# Patient Record
Sex: Female | Born: 1950 | Race: White | Hispanic: No | Smoking: Current every day smoker
Health system: Southern US, Community
[De-identification: ages and names within clinical notes are randomized; demographics above are authoritative.]

## PROBLEM LIST (undated history)

## (undated) DIAGNOSIS — K449 Diaphragmatic hernia without obstruction or gangrene: Secondary | ICD-10-CM

## (undated) DIAGNOSIS — I10 Essential (primary) hypertension: Secondary | ICD-10-CM

## (undated) HISTORY — PX: FRACTURE SURGERY: SHX138

## (undated) HISTORY — DX: Essential (primary) hypertension: I10

## (undated) HISTORY — PX: TUBAL LIGATION: SHX77

## (undated) HISTORY — DX: Diaphragmatic hernia without obstruction or gangrene: K44.9

---

## 2011-12-16 ENCOUNTER — Emergency Department: Payer: Self-pay | Admitting: Emergency Medicine

## 2011-12-16 LAB — CBC
HCT: 41.5 % (ref 35.0–47.0)
HGB: 14.1 g/dL (ref 12.0–16.0)
MCH: 29.6 pg (ref 26.0–34.0)
MCHC: 34 g/dL (ref 32.0–36.0)
MCV: 87 fL (ref 80–100)
Platelet: 142 10*3/uL — ABNORMAL LOW (ref 150–440)
RBC: 4.76 10*6/uL (ref 3.80–5.20)
WBC: 8.4 10*3/uL (ref 3.6–11.0)

## 2011-12-16 LAB — COMPREHENSIVE METABOLIC PANEL
Alkaline Phosphatase: 115 U/L (ref 50–136)
Anion Gap: 6 — ABNORMAL LOW (ref 7–16)
Bilirubin,Total: 0.4 mg/dL (ref 0.2–1.0)
Chloride: 109 mmol/L — ABNORMAL HIGH (ref 98–107)
Creatinine: 0.83 mg/dL (ref 0.60–1.30)
EGFR (African American): >60
Osmolality: 285 (ref 275–301)
Potassium: 4 mmol/L (ref 3.5–5.1)
SGPT (ALT): 15 U/L (ref 12–78)
Total Protein: 6.9 g/dL (ref 6.4–8.2)

## 2011-12-16 IMAGING — CT CT HEAD WITHOUT CONTRAST
1 series · 16 of 30 positions shown, 20 images · non-contrast
Comparison: none

REASON FOR EXAM: right side facial droop, onset 3 days ago
COMMENTS:

PROCEDURE:     CT  - CT HEAD WITHOUT CONTRAST  - [DATE] [DATE]
RESULT:     Head CT dated [DATE].
TECHNIQUE: Helical noncontrasted 5 mm sections were obtained in the skull
base to the vertex.

[Series 2: soft tissue · axial · 0.43mm/px · z∈[-116,+18]mm · 16 of 31 slices shown, 20 images]
[im 2/31  brain]
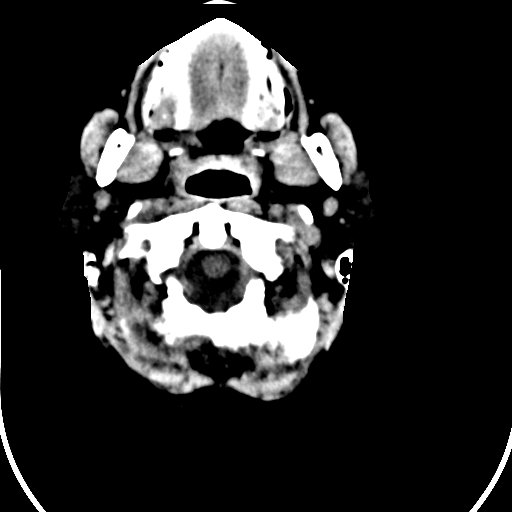
[im 2/31  bone]
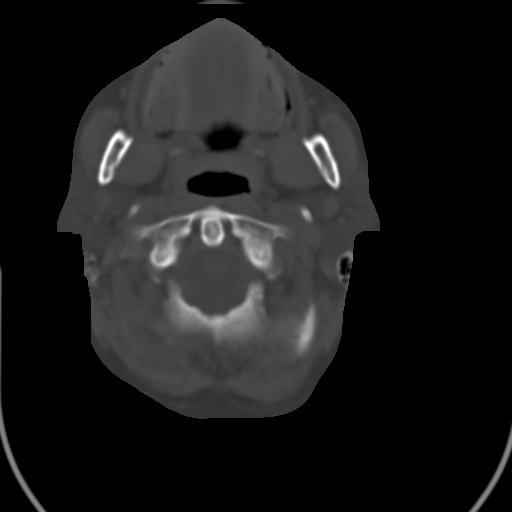
[im 4/31  brain]
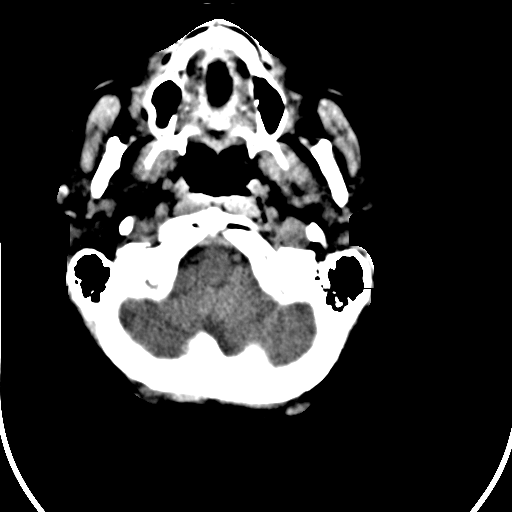
[im 6/31  brain]
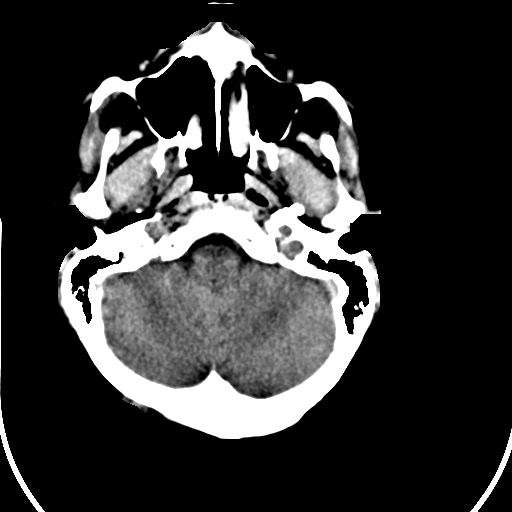
[im 8/31  brain]
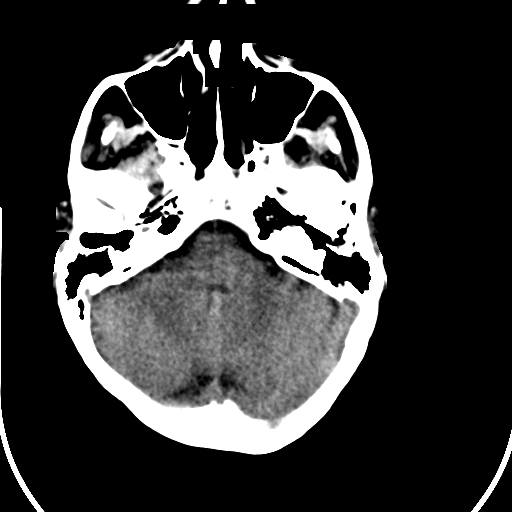
[im 9/31  brain]
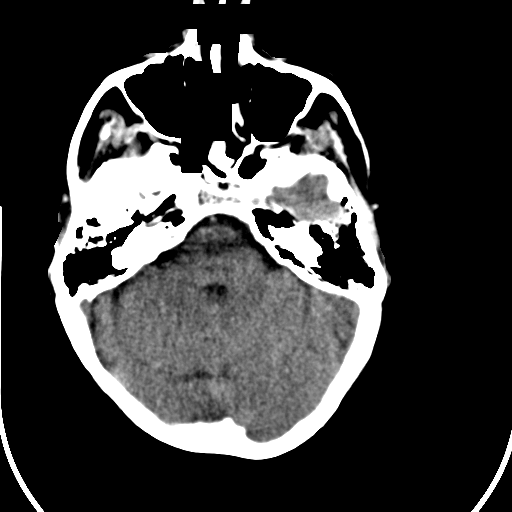
[im 9/31  bone]
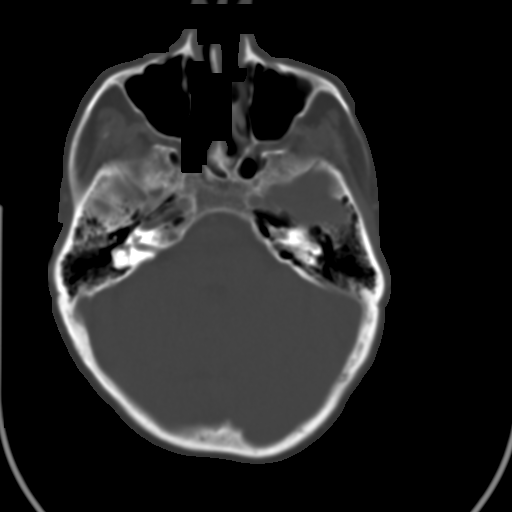
[im 11/31  brain]
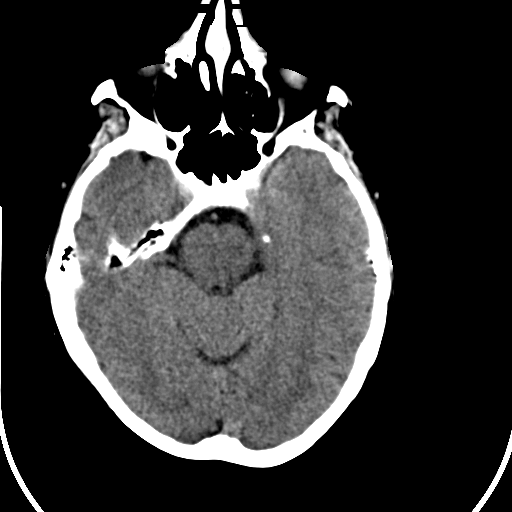
[im 13/31  brain]
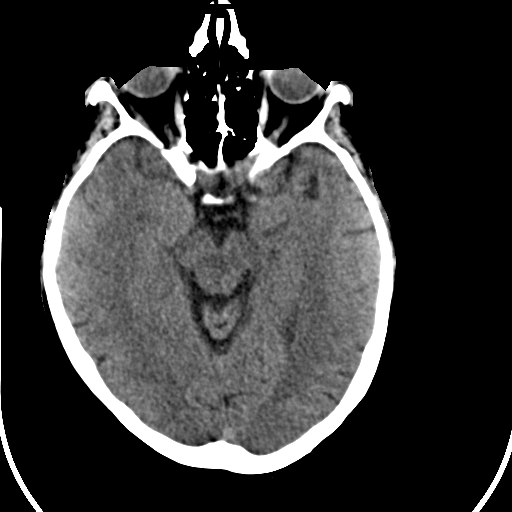
[im 15/31  brain]
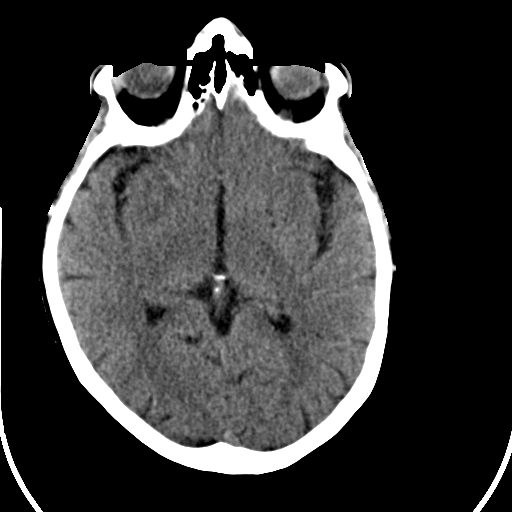
[im 16/31  brain]
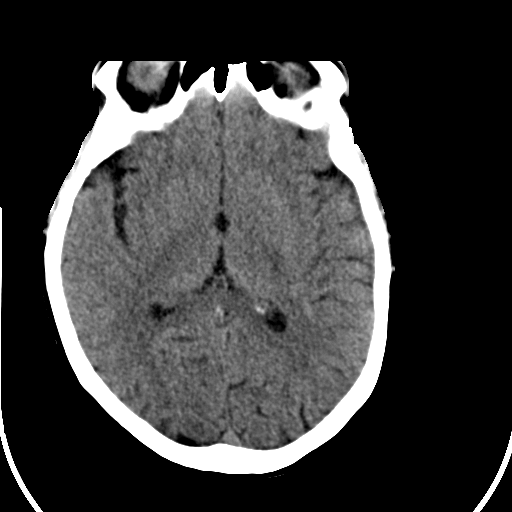
[im 16/31  bone]
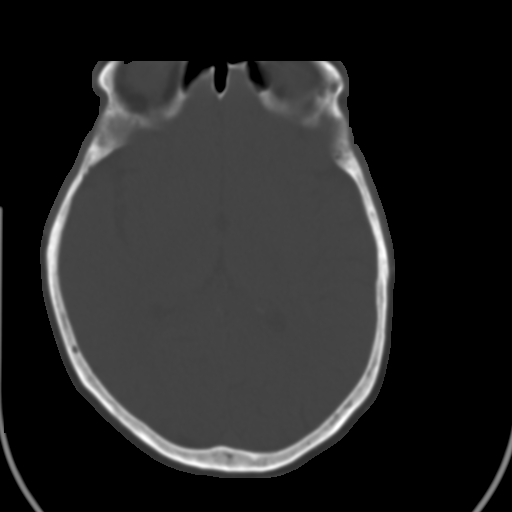
[im 18/31  brain]
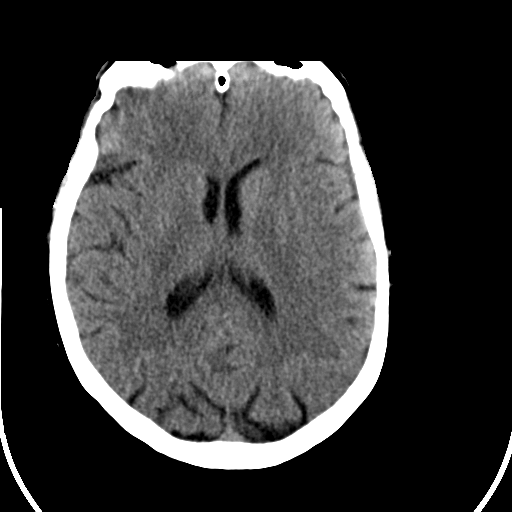
[im 20/31  brain]
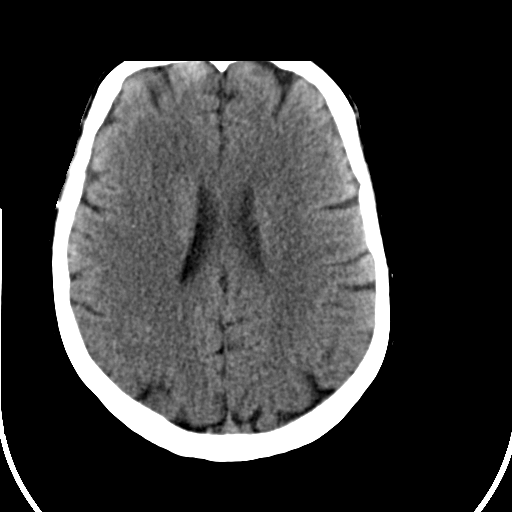
[im 22/31  brain]
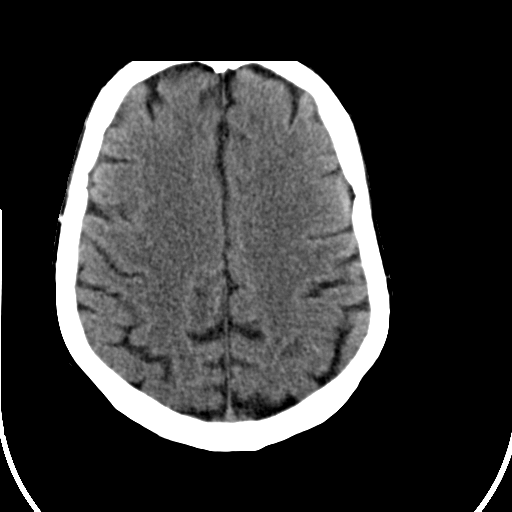
[im 23/31  brain]
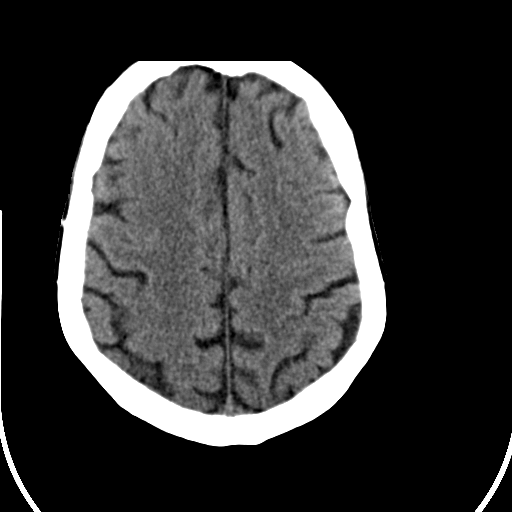
[im 23/31  bone]
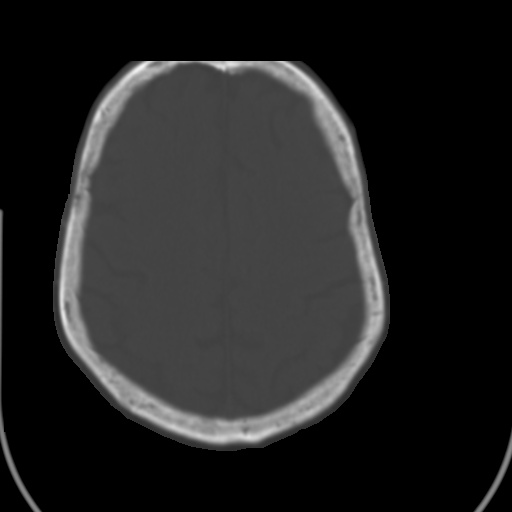
[im 25/31  brain]
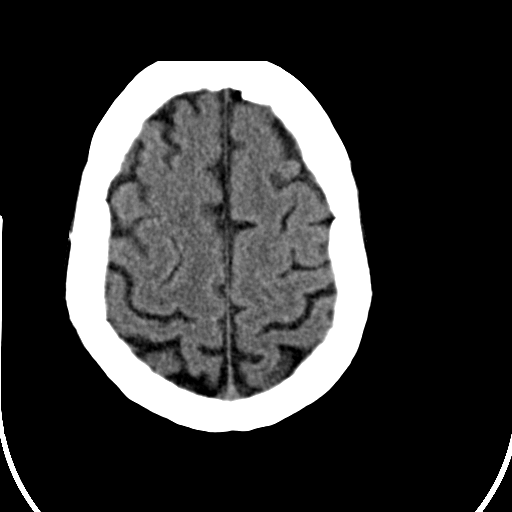
[im 27/31  brain]
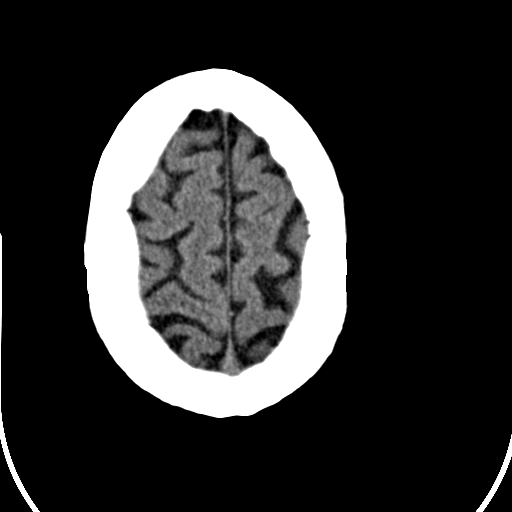
[im 29/31  brain]
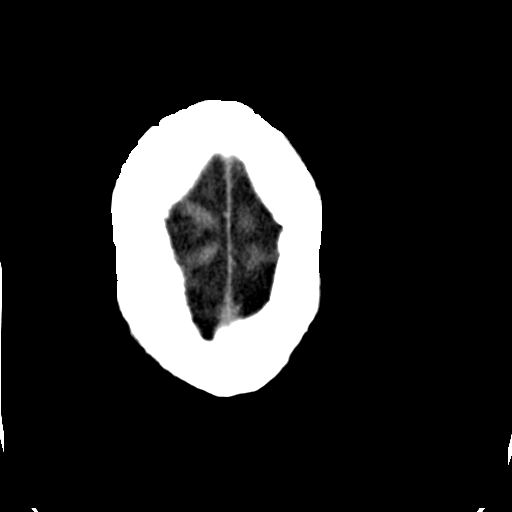

[16 of 30 positions shown; findings below may reference images not displayed]

FINDINGS: There is no evidence of intra-axial nor extra-axial fluid
collections nor evidence of acute hemorrhage. There is no evidence of mass
effect nor a depressed skull fracture. Mild diffuse areas of low-attenuation
project within the subcortical, deep, and periventricular white matter
regions. There is no evidence of a depressed skull fracture. Visualized
paranasal sinuses and mastoid air cells are patent.
IMPRESSION: Small vessel ischemic changes without evidence of focal or
acute abnormalities.

## 2013-09-02 ENCOUNTER — Emergency Department: Payer: Self-pay | Admitting: Emergency Medicine

## 2017-01-02 ENCOUNTER — Emergency Department: Payer: BLUE CROSS/BLUE SHIELD

## 2017-01-02 ENCOUNTER — Encounter: Payer: Self-pay | Admitting: Emergency Medicine

## 2017-01-02 ENCOUNTER — Emergency Department
Admission: EM | Admit: 2017-01-02 | Discharge: 2017-01-02 | Disposition: A | Discharge status: Home / Self Care | Payer: BLUE CROSS/BLUE SHIELD | Attending: Emergency Medicine | Admitting: Emergency Medicine

## 2017-01-02 DIAGNOSIS — F17210 Nicotine dependence, cigarettes, uncomplicated: Secondary | ICD-10-CM | POA: Insufficient documentation

## 2017-01-02 DIAGNOSIS — I10 Essential (primary) hypertension: Secondary | ICD-10-CM | POA: Insufficient documentation

## 2017-01-02 DIAGNOSIS — R0789 Other chest pain: Secondary | ICD-10-CM | POA: Insufficient documentation

## 2017-01-02 LAB — CBC
HCT: 40.9 % (ref 35.0–47.0)
HEMOGLOBIN: 14 g/dL (ref 12.0–16.0)
MCH: 29 pg (ref 26.0–34.0)
MCHC: 34.1 g/dL (ref 32.0–36.0)
MCV: 84.9 fL (ref 80.0–100.0)
PLATELETS: 142 10*3/uL — AB (ref 150–440)
RBC: 4.82 MIL/uL (ref 3.80–5.20)
RDW: 14.5 % (ref 11.5–14.5)
WBC: 9.1 10*3/uL (ref 3.6–11.0)

## 2017-01-02 LAB — BASIC METABOLIC PANEL
ANION GAP: 8 (ref 5–15)
BUN: 16 mg/dL (ref 6–20)
CALCIUM: 9.1 mg/dL (ref 8.9–10.3)
CO2: 28 mmol/L (ref 22–32)
CREATININE: 0.76 mg/dL (ref 0.44–1.00)
Chloride: 103 mmol/L (ref 101–111)
Glucose, Bld: 86 mg/dL (ref 65–99)
Potassium: 3.6 mmol/L (ref 3.5–5.1)
SODIUM: 139 mmol/L (ref 135–145)

## 2017-01-02 LAB — TROPONIN I

## 2017-01-02 IMAGING — CR DG CHEST 2V
2 series · 2 of 2 positions shown · non-contrast
Comparison: None.

CLINICAL DATA: Acute chest pain today.

EXAM:
CHEST  2 VIEW

[chest pa]
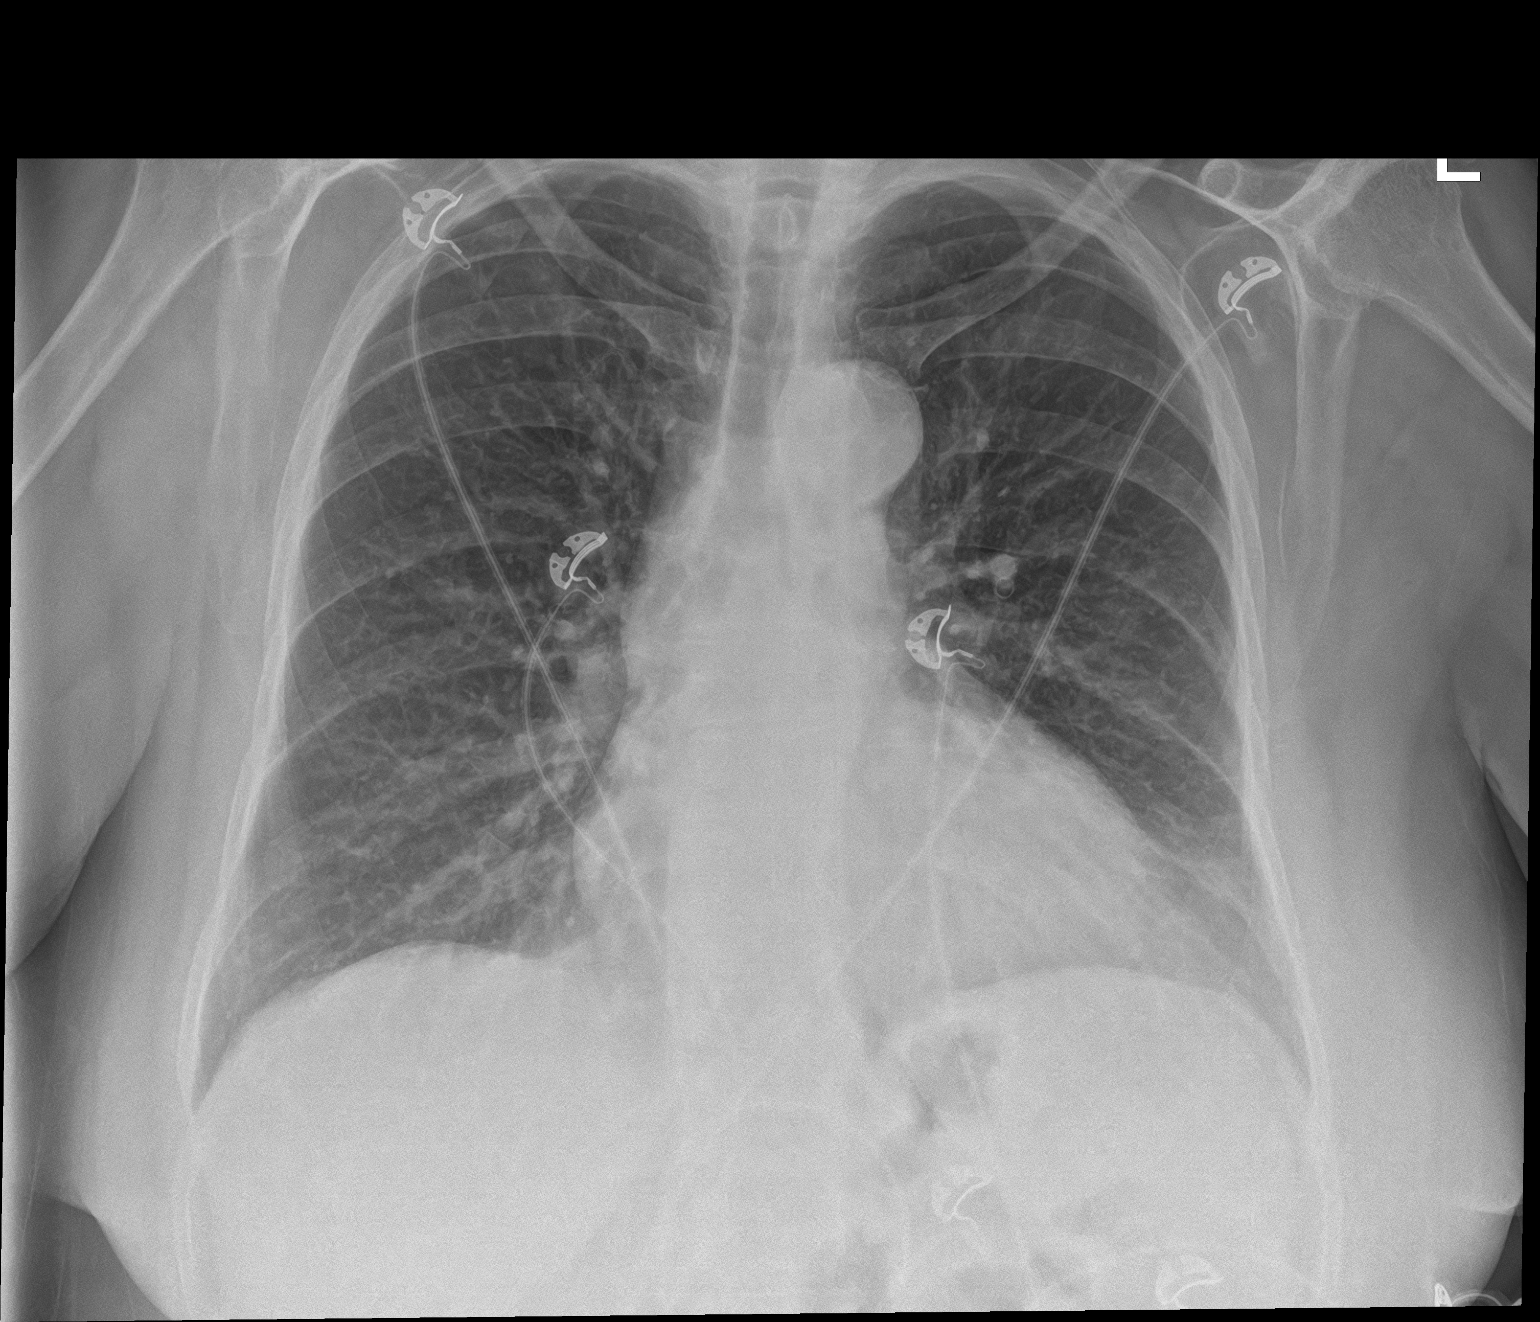

[chest lat]
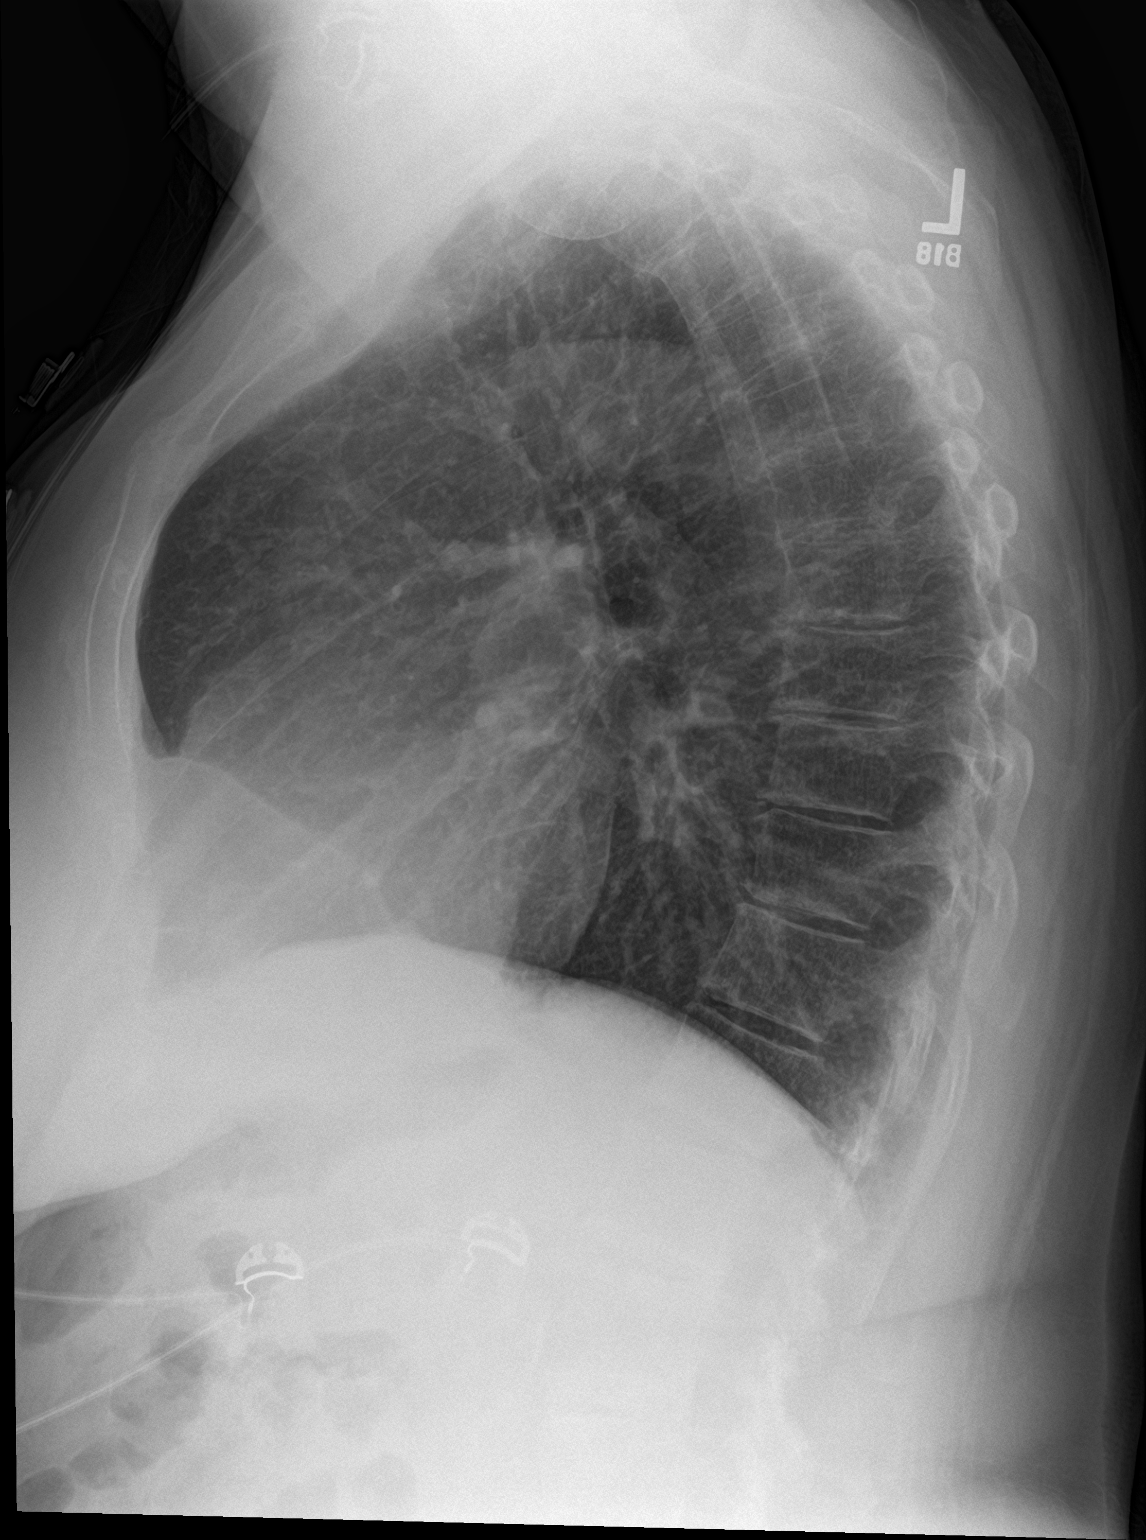

[2 of 2 positions shown; findings below may reference images not displayed]

FINDINGS: Upper limits normal heart size noted.

Mild peribronchial thickening noted.

There is no evidence of focal airspace disease, pulmonary edema,
suspicious pulmonary nodule/mass, pleural effusion, or pneumothorax.
No acute bony abnormalities are identified.
IMPRESSION: Mild peribronchial thickening of uncertain chronicity. No evidence
of focal pneumonia.

Upper limits normal heart size

## 2017-01-02 MED ORDER — AMLODIPINE BESYLATE 5 MG PO TABS: 5.0000 mg | ORAL_TABLET | Freq: Every day | ORAL | 0 refills | Status: DC

## 2017-01-02 NOTE — ED Triage Notes (Signed)
Pt arrives via [facility] with complaints of chest pain while at work that started around 3pm today. Pt reports feeling a heaviness on her chest. Pt reports having pain on and off x 1 year but never following up about it. Pt takes caffeine tablets. Last one was taken this morning. Pt received 4 baby aspirin en route with EMS and has 1" nitro paste to left chest. Pt denies shortness of breath, nausea and vomiting.

## 2017-01-02 NOTE — ED Provider Notes (Signed)
[facility] Emergency Department Provider Note ____________________________________________   First MD Initiated Contact with Patient 01/02/17 1642     (approximate)  I have reviewed the triage vital signs and the nursing notes.   HISTORY  Chief Complaint Chest Pain    HPI Robin Adams is a 66 y.o. 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 Patient states the pain is now resolved, and she feels some mild remaining "heaviness".  she states the lightheadedness is resolved. Patient states she has had a few similar episodes over the last few months. She denies any prior cardiac history. She states she has not been to the doctor in a while and has never been diagnosed with hypertension.  No leg pain or swelling. No difficulty breathing, cough, or palpitations. No fevers.  History reviewed. No pertinent past medical history.  There are no active problems to display for this patient.   Past Surgical History:  Procedure Laterality Date  . CESAREAN SECTION      Prior to Admission medications   Medication Sig Start Date End Date Taking? Authorizing Provider  amLODipine (NORVASC) 5 MG tablet Take 1 tablet (5 mg total) by mouth daily. 01/02/17 02/01/17  Arta Silence, MD    Allergies Tetanus toxoids  No family history on file.  Social History Social History  Substance Use Topics  . Smoking status: Current Every Day Smoker    Packs/day: 1.00    Years: 50.00    Types: Cigarettes  . Smokeless tobacco: Never Used  . Alcohol use No    Review of Systems  Constitutional: No fever.  Eyes: No visual changes.  ENT: No sore throat. Cardiovascular: Positive for chest pain. Respiratory: Denies shortness of breath. Gastrointestinal: No nausea, no  vomiting.    Genitourinary: Negative for dysuria.  Musculoskeletal: Negative for back pain. Skin: Negative for rash. Neurological: Negative for headaches.   ____________________________________________   PHYSICAL EXAM:  VITAL SIGNS: ED Triage Vitals  Enc Vitals Group     BP 01/02/17 1617 (!) 188/117     Pulse Rate 01/02/17 1617 61     Resp 01/02/17 1617 14     Temp 01/02/17 1617 98.6 F (37 C)     Temp Source 01/02/17 1617 Oral     SpO2 01/02/17 1617 98 %     Weight 01/02/17 1621 210 lb (95.3 kg)     Height 01/02/17 1621 5' 6.5" (1.689 m)     Head Circumference --      Peak Flow --      Pain Score 01/02/17 1617 1     Pain Loc --      Pain Edu? --      Excl. in [place]? --     Constitutional: Alert and oriented. Well appearing and in no acute distress. Eyes: Conjunctivae are normal.  Head: Atraumatic. Nose: No congestion/rhinnorhea. Mouth/Throat: Mucous membranes are moist.   Neck: Normal range of motion.  Cardiovascular: Normal rate, regular rhythm. Grossly normal heart sounds.  Good peripheral circulation. Respiratory: Normal respiratory effort.  No retractions. Lungs CTAB. Gastrointestinal: Soft and nontender. No distention.  Genitourinary: No CVA tenderness. Musculoskeletal: No lower extremity edema.  Extremities warm and well perfused.  Neurologic:  Normal speech and language. No gross focal neurologic deficits are appreciated.  Skin:  Skin is warm and  dry. No rash noted. Psychiatric: Mood and affect are normal. Speech and behavior are normal.  ____________________________________________   LABS (all labs ordered are listed, but only abnormal results are displayed)  Labs Reviewed  CBC - Abnormal; Notable for the following:       Result Value   Platelets 142 (*)    All other components within normal limits  BASIC METABOLIC PANEL  TROPONIN I  TROPONIN I   ____________________________________________  EKG  ED ECG REPORT I, Arta Silence, the  attending physician, personally viewed and interpreted this ECG.  Date: 01/02/2017 EKG Time: 1620 Rate: 61 Rhythm: normal sinus rhythm  QRS Axis: borderline left axis Intervals: normal ST/T Wave abnormalities: normal Narrative Interpretation: no evidence of acute ischemia  ____________________________________________  RADIOLOGY  CXR shows mild peribronchial thickening, no other acute findings.   ____________________________________________   PROCEDURES  Procedure(s) performed: No    Critical Care performed: No ____________________________________________   INITIAL IMPRESSION / ASSESSMENT AND PLAN / ED COURSE  Pertinent labs & imaging results that were available during my care of the patient were reviewed by me and considered in my medical decision making (see chart for details).  66 year old female with no significant past medical history presents with atypical chest pain for the last few hours which is now resolved. She reports slight lightheadedness but no other significant associated symptoms.  patient is hypertensive, other vital signs are normal, and the remainder the exam is unremarkable. Patient's EKG has no ischemic findings. Differential includes ACS versus musculoskeletal pain, GERD, or other benign cause. Given normal EKG and the atypical quality of the pain, I'm lower suspicion for ACS. Since the pain has been less than 4 hours we will obtain troponin 2 as well as basic labs.  Patient has no PE or DVT risk factors, and no clinical evidence for PE or DVT on exam. No evidence of aortic or other vascular etiology. I suspect that she may have long-standing untreated hypertension, and since she does not have a primary care doctor, if the chest pain work up is negative I will likely discharge home with antihypertensive for her to take until she can see a PMD.     ----------------------------------------- 7:43 PM on  01/02/2017 -----------------------------------------  Initial and repeat troponin are negative. Patient reports resolution of her symptoms, and feels comfortable now. She feels well to go home. At this time there is no evidence for ACS or indication for further emergency department workup. I gave patient a thorough return precautions and answered all of her questions. Given the persistent elevated blood pressure and likelihood of ongoing blood pressure we will start on amlodipine. Patient wants to follow up at [facility] family practice to establish primary care doctor.  ____________________________________________   FINAL CLINICAL IMPRESSION(S) / ED DIAGNOSES  Final diagnoses:  Atypical chest pain  Hypertension, unspecified type      NEW MEDICATIONS STARTED DURING THIS VISIT:  Discharge Medication List as of 01/02/2017  7:46 PM    START taking these medications   Details  amLODipine (NORVASC) 5 MG tablet Take 1 tablet (5 mg total) by mouth daily., Starting Mon 01/02/2017, Until Wed 02/01/2017, Print         Note:  This document was prepared using Dragon voice recognition software and may include unintentional dictation errors.    Arta Silence, MD 01/02/17 2109

## 2017-01-02 NOTE — Discharge Instructions (Signed)
Return to the ER for new, worsening, or recurrent chest pain, lightheadedness, weakness, difficulty breathing, palpitations, or any other new or worsening symptoms that concern you. You should periodically checked your blood pressure at home, take the medication as prescribed, and follow up with a primary care doctor within the next 1-2 weeks.

## 2017-01-24 ENCOUNTER — Encounter: Payer: Self-pay | Admitting: Physician Assistant

## 2017-01-24 ENCOUNTER — Ambulatory Visit (INDEPENDENT_AMBULATORY_CARE_PROVIDER_SITE_OTHER): Payer: BLUE CROSS/BLUE SHIELD | Admitting: Physician Assistant

## 2017-01-24 VITALS — BP 126/78 | HR 80 | Temp 97.8°F | Resp 16 | Ht 66.5 in | Wt 217.0 lb

## 2017-01-24 DIAGNOSIS — Z0000 Encounter for general adult medical examination without abnormal findings: Secondary | ICD-10-CM | POA: Diagnosis not present

## 2017-01-24 DIAGNOSIS — K458 Other specified abdominal hernia without obstruction or gangrene: Secondary | ICD-10-CM

## 2017-01-24 DIAGNOSIS — I10 Essential (primary) hypertension: Secondary | ICD-10-CM

## 2017-01-24 DIAGNOSIS — Z124 Encounter for screening for malignant neoplasm of cervix: Secondary | ICD-10-CM

## 2017-01-24 DIAGNOSIS — Z720 Tobacco use: Secondary | ICD-10-CM

## 2017-01-24 DIAGNOSIS — Z1211 Encounter for screening for malignant neoplasm of colon: Secondary | ICD-10-CM | POA: Diagnosis not present

## 2017-01-24 DIAGNOSIS — Z1231 Encounter for screening mammogram for malignant neoplasm of breast: Secondary | ICD-10-CM

## 2017-01-24 DIAGNOSIS — Z1239 Encounter for other screening for malignant neoplasm of breast: Secondary | ICD-10-CM

## 2017-01-24 MED ORDER — AMLODIPINE BESYLATE 5 MG PO TABS: 5.0000 mg | ORAL_TABLET | Freq: Every day | ORAL | 0 refills | Status: DC

## 2017-01-24 NOTE — Progress Notes (Addendum)
Patient: Robin Adams Female    DOB: 25-Aug-1950   66 y.o.   MRN: [id number] Visit Date: 01/24/2017  Today's Provider: Trinna Post, PA-C   Chief Complaint  Patient presents with  . Establish Care  . Hypertension   Subjective:    Robin Adams is a 66 y/o woman presenting today to establish care.  She lives in [place] with her daughter. She has three daughters total. She works as a [employer]arry manager.  She had an episode of chest pain on 01/02/2017 and reported to the ED. EKG was remarkable for LAD and possible left atrial enlargement, but otherwise no signs of ischemia or MI. Her tropnins were negative x 2. CBC and CMET were unremarkable. CXR showed peribronchial thickening and heart of upper limits of normal. Otherwise, no findings. She was noted to be hypertensive and discharged on 5mg  amlodipine QD which she has been taking consistently.   Hypertension  This is a new problem. The problem has been gradually improving since onset. The problem is uncontrolled (Pt reports taking her blood pressure at home and work.  Generally it runs around 140's/80's, at work, and 120's/70's at home.  ). Pertinent negatives include no anxiety, blurred vision, chest pain, headaches, malaise/fatigue, neck pain, orthopnea, palpitations, peripheral edema, PND, shortness of breath or sweats. There are no associated agents to hypertension. Compliance problems include medication side effects (Pt reports Amlodipine is making her fatigue.).    She has been smoking 3/4 pack x 60 years. She does not want to quit. She refuses yearly low dose screening CT. She says if she got lung cancer, she would treat it, however, she continues to refuse screening. She also refuses Pneumovax.  She has never had a colonoscopy. She does not have family history of colon cancer. She refuses colonoscopy. She refuses cologuard. She refuses any type of colon cancer screening.   She refuses a mammogram. She does have history of breast cancer  in maternal grandmother. No abnormal mammograms  She had a PAP smear 5 years ago. Does not have history of abnormal PAP smear. She refuses another PAP smear.  She declines labwork for cholesterol or thyroid.  She is not interested in a shingles vaccine. She has allergic reaction to tetanus vaccine including hives.  She reports abdominal mass x 1 year. She thinks it's a hernia. She reports it goes in and out, especially with exertion. She denies the mass getting stuck. She denies pain. She denies vaginal bleeding. No vomiting.      Allergies  Allergen Reactions  . Tetanus Toxoids Hives     Current Outpatient Prescriptions:  .  amLODipine (NORVASC) 5 MG tablet, Take 1 tablet (5 mg total) by mouth daily., Disp: 30 tablet, Rfl: 0  Review of Systems  Constitutional: Negative.  Negative for malaise/fatigue.  HENT: Negative.   Eyes: Negative.  Negative for blurred vision.  Respiratory: Negative.  Negative for shortness of breath.   Cardiovascular: Negative.  Negative for chest pain, palpitations, orthopnea and PND.  Gastrointestinal: Negative.   Endocrine: Negative.   Genitourinary: Negative.   Musculoskeletal: Negative.  Negative for neck pain.  Skin: Negative.   Allergic/Immunologic: Negative.   Neurological: Negative.  Negative for headaches.  Hematological: Negative.   Psychiatric/Behavioral: Negative.     Social History  Substance Use Topics  . Smoking status: Current Every Day Smoker    Packs/day: 1.00    Years: 50.00    Types: Cigarettes  . Smokeless tobacco: Never Used  .  Alcohol use No   Objective:   BP 126/78 (BP Location: Left Arm, Patient Position: Sitting, Cuff Size: Large)   Pulse 80   Temp 97.8 F (36.6 C) (Oral)   Resp 16   Ht 5' 6.5" (1.689 m)   Wt 217 lb (98.4 kg)   BMI 34.50 kg/m  Vitals:   01/24/17 1424  BP: 126/78  Pulse: 80  Resp: 16  Temp: 97.8 F (36.6 C)  TempSrc: Oral  Weight: 217 lb (98.4 kg)  Height: 5' 6.5" (1.689 m)      Physical Exam  Constitutional: She is oriented to person, place, and time. She appears well-developed and well-nourished.  Cardiovascular: Normal rate and regular rhythm.   Pulmonary/Chest: Effort normal and breath sounds normal.  Abdominal: Soft. Bowel sounds are normal. She exhibits mass.  Sizable abdominal deformity. Midline abdominal mass that reduces when patient supine. Bowel sounds present in abdominal mass.   Neurological: She is alert and oriented to person, place, and time.  Skin: Skin is warm and dry.  Psychiatric: She has a normal mood and affect. Her behavior is normal.        Assessment & Plan:     1. Encounter for medical examination to establish care   Follow up once per year to refill BP medication.   2. Other specified abdominal hernia without obstruction or gangrene  Can't rule out malignancy. Recommend seeing surgery. Patient declines, says she doesn't have enough time to get off work. Would recommend seeing surgery sooner rather than later due to size of deformity and job requiring a lot of exertion  3. Essential hypertension  Declines cholesterol and thyroid. EKG with LAD and possible left atrial enlargement, likely due to prolonged and untreated HTN.  - amLODipine (NORVASC) 5 MG tablet; Take 1 tablet (5 mg total) by mouth daily.  Dispense: 90 tablet; Refill: 0  4. Colon cancer screening  Refused.  5. Tobacco abuse  Smoking cessation refused. Low dose CT scan refused.  6. Cervical cancer screening  Refused.  7. Breast cancer screening  Refused.   Return in about 1 year (around 01/24/2018) for HTN.  The entirety of the information documented in the History of Present Illness, Review of Systems and Physical Exam were personally obtained by me. Portions of this information were initially documented by Ashley Royalty, CMA and reviewed by me for thoroughness and accuracy.        Trinna Post, PA-C  [facility]

## 2017-01-24 NOTE — Patient Instructions (Signed)

## 2017-06-05 ENCOUNTER — Other Ambulatory Visit: Payer: Self-pay | Admitting: Physician Assistant

## 2017-06-05 DIAGNOSIS — I10 Essential (primary) hypertension: Secondary | ICD-10-CM

## 2017-06-05 NOTE — Telephone Encounter (Signed)
Pt is requesting refill on amLODipine (NORVASC) 5 MG tablet.She would like this sent to [facility]

## 2017-06-06 MED ORDER — AMLODIPINE BESYLATE 5 MG PO TABS: 5.0000 mg | ORAL_TABLET | Freq: Every day | ORAL | 1 refills | Status: DC

## 2018-07-02 ENCOUNTER — Telehealth: Payer: Self-pay

## 2018-07-02 NOTE — Telephone Encounter (Signed)
Patient schedule for e-visit tomorrow.

## 2018-07-02 NOTE — Telephone Encounter (Signed)
Patient called office requesting for a lab order for COVID-19 testing. Patient states that she went into work this morning and was coughing at work, she states that employer told her to go home and that she was not allowed to return to work until she got testing done. I informed patient that pop up tents are no longer available but will forward to her PCP. Patient denies dyspnea, fever, recent travel, lost of smell or taste. Patient states that she can be reached at home. KW

## 2018-07-02 NOTE — Telephone Encounter (Signed)
No testing health system wide for outpatients. She needs to stay home if she feels is having respiratory symptoms. Can offer e-visit if she would like to be assessed.

## 2018-07-03 ENCOUNTER — Ambulatory Visit (INDEPENDENT_AMBULATORY_CARE_PROVIDER_SITE_OTHER): Payer: Commercial Managed Care - PPO | Admitting: Physician Assistant

## 2018-07-03 DIAGNOSIS — R05 Cough: Secondary | ICD-10-CM | POA: Diagnosis not present

## 2018-07-03 DIAGNOSIS — I10 Essential (primary) hypertension: Secondary | ICD-10-CM | POA: Diagnosis not present

## 2018-07-03 DIAGNOSIS — Z720 Tobacco use: Secondary | ICD-10-CM

## 2018-07-03 DIAGNOSIS — F17210 Nicotine dependence, cigarettes, uncomplicated: Secondary | ICD-10-CM

## 2018-07-03 DIAGNOSIS — R059 Cough, unspecified: Secondary | ICD-10-CM

## 2018-07-03 NOTE — Patient Instructions (Signed)

## 2018-07-03 NOTE — Progress Notes (Signed)
Subjective:    Patient ID: Robin Adams, female    DOB: 03/06/1951, 68 y.o.   MRN: [id number]  Robin Adams is a 68 y.o. female with history of morbid obesity, fifty pack year smoking history and HTN presenting on 07/03/2018 for Cough   HPI   Patient is a Psychologist, counselling at [employer] and reports she had a cough, headache, reports nose is running, sore throat for three days and was sent home by her employer to get tested for COVID. Reports this is starting to clear up. She denies international or out of state travel. She denies known contact with laboratory confirmed case of COVID 19. She denies fevers, chills, SOB. Denies vomiting, reports some muscle aches reports which is somewhat. Lives with daughter in the house who has been under the weather. 07/01/2018. Ibuprofen last night for sleeping. She is having nasal congestion  Social History   Tobacco Use  . Smoking status: Current Every Day Smoker    Packs/day: 1.00    Years: 50.00    Pack years: 50.00    Types: Cigarettes  . Smokeless tobacco: Never Used  Substance Use Topics  . Alcohol use: No  . Drug use: No    Review of Systems Per HPI unless specifically indicated above     Objective:    There were no vitals taken for this visit.  Wt Readings from Last 3 Encounters:  01/24/17 217 lb (98.4 kg)  01/02/17 210 lb (95.3 kg)    Physical Exam Pulmonary:     Effort: Pulmonary effort is normal.     Comments: Patient not audibly SOB, is coughing during encounter.  Neurological:     Mental Status: She is alert.  Psychiatric:        Mood and Affect: Mood normal.    Results for orders placed or performed during the hospital encounter of 60/10/93  Basic metabolic panel  Result Value Ref Range   Sodium 139 135 - 145 mmol/L   Potassium 3.6 3.5 - 5.1 mmol/L   Chloride 103 101 - 111 mmol/L   CO2 28 22 - 32 mmol/L   Glucose, Bld 86 65 - 99 mg/dL   BUN 16 6 - 20 mg/dL   Creatinine, Ser 0.76 0.44 - 1.00 mg/dL   Calcium 9.1  8.9 - 10.3 mg/dL   GFR calc non Af Amer >60 >60 mL/min   GFR calc Af Amer >60 >60 mL/min   Anion gap 8 5 - 15  CBC  Result Value Ref Range   WBC 9.1 3.6 - 11.0 K/uL   RBC 4.82 3.80 - 5.20 MIL/uL   Hemoglobin 14.0 12.0 - 16.0 g/dL   HCT 40.9 35.0 - 47.0 %   MCV 84.9 80.0 - 100.0 fL   MCH 29.0 26.0 - 34.0 pg   MCHC 34.1 32.0 - 36.0 g/dL   RDW 14.5 11.5 - 14.5 %   Platelets 142 (L) 150 - 440 K/uL  Troponin I  Result Value Ref Range   Troponin I <0.03 <0.03 ng/mL  Troponin I  Result Value Ref Range   Troponin I <0.03 <0.03 ng/mL      Assessment & Plan:  1. 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 Have advised that we are unable to test for COVID-19 or influenza. Our recommendations for people with symptoms are to stay home and self isolate. Out of abundance of caution  and in light of her job, have advised her to stay home. Also advised to take allergy medicine like zyrtec. Work note provided.   2. Tobacco abuse  Continues to smoke.    Follow up plan: Return if symptoms worsen or fail to improve.  Carles Collet, PA-C [facility] 07/03/2018, 12:33 PM

## 2018-09-10 ENCOUNTER — Ambulatory Visit: Payer: Commercial Managed Care - PPO | Admitting: Physician Assistant

## 2018-09-10 ENCOUNTER — Encounter: Payer: Self-pay | Admitting: Physician Assistant

## 2018-09-10 ENCOUNTER — Other Ambulatory Visit: Payer: Self-pay

## 2018-09-10 VITALS — BP 186/105 | HR 85 | Temp 98.4°F | Resp 16 | Ht 66.0 in | Wt 235.0 lb

## 2018-09-10 DIAGNOSIS — K439 Ventral hernia without obstruction or gangrene: Secondary | ICD-10-CM

## 2018-09-10 DIAGNOSIS — N950 Postmenopausal bleeding: Secondary | ICD-10-CM | POA: Diagnosis not present

## 2018-09-10 DIAGNOSIS — N83202 Unspecified ovarian cyst, left side: Secondary | ICD-10-CM

## 2018-09-10 DIAGNOSIS — N83201 Unspecified ovarian cyst, right side: Secondary | ICD-10-CM

## 2018-09-10 DIAGNOSIS — R9389 Abnormal findings on diagnostic imaging of other specified body structures: Secondary | ICD-10-CM

## 2018-09-10 DIAGNOSIS — Z6837 Body mass index (BMI) 37.0-37.9, adult: Secondary | ICD-10-CM

## 2018-09-10 NOTE — Patient Instructions (Signed)
Postmenopausal Bleeding    Postmenopausal bleeding is any bleeding that happens after menopause. Menopause is when a woman's period stops. Any type of bleeding after menopause should be checked by your doctor. Treatment will depend on the cause.  Follow these instructions at home:   Pay attention to any changes in your symptoms.   Avoid using tampons and douches as told by your doctor.   Change your pads regularly.   Get regular pelvic exams and Pap tests.   Take iron pills as told by your doctor.   Take over-the-counter and prescription medicines only as told by your doctor.   Keep all follow-up visits as told by your doctor. This is important.  Contact a doctor if:   Your bleeding lasts for more than 1 week.   You have belly (abdominal) pain.   You have bleeding during or after sex (intercourse).   You have bleeding that happens more often than every 3 weeks.  Get help right away if:   You have a fever, chills, a headache, dizziness, muscle aches, and bleeding.   You have strong pain with bleeding.   You have clumps of blood (blood clots) coming from your vagina.   You have a lot of bleeding and:  ? You need more than 1 pad an hour.  ? This has never happened before.   You feel like you are going to pass out (faint).  Summary   Any type of bleeding after menopause should be checked by your doctor.   Pay attention to any changes in your symptoms.   Keep all follow-up visits as told by your doctor.  This information is not intended to replace advice given to you by your health care provider. Make sure you discuss any questions you have with your health care provider.  Document Released: 01/05/2008 Document Revised: 05/03/2016 Document Reviewed: 05/03/2016  Elsevier Interactive Patient Education  2019 [employer]

## 2018-09-10 NOTE — Progress Notes (Signed)
Patient: Robin Adams Female    DOB: 1950-12-20   68 y.o.   MRN: [id number] Visit Date: 09/10/2018  Today's Provider: Mar Daring, PA-C   Chief Complaint  Patient presents with  . Vaginal Discharge   Subjective:     HPI   Patient here today c/o vaginal bleeding on and off for a couple of weeks. Patient reports flow is light and passes some clots. Patient reports that she is having bleeding currently. She is postmenopausal and reports she has been for over 10 years.   Also reports that her abdominal area has gotten bigger and firm x's 2 years. Patient reports that she does have a hernia but it has grown over the last few months and now is starting to get more hard. She also reports previously being able to reduce the hernia and would wear an abdominal brace but now she cannot reduce the hernia and cannot get a brace on. She denies any bowel changes.    Patient here with her daughter Lawerance Bach.   Allergies  Allergen Reactions  . Tetanus Toxoids Hives     Current Outpatient Medications:  .  amLODipine (NORVASC) 5 MG tablet, Take 1 tablet (5 mg total) by mouth daily. (Patient not taking: Reported on 09/10/2018), Disp: 90 tablet, Rfl: 1  Review of Systems  Constitutional: Negative.   Respiratory: Negative.   Cardiovascular: Negative.   Gastrointestinal:       Large abdominal hernia  Genitourinary: Positive for menstrual problem and vaginal bleeding.    Social History   Tobacco Use  . Smoking status: Current Every Day Smoker    Packs/day: 1.00    Years: 50.00    Pack years: 50.00    Types: Cigarettes  . Smokeless tobacco: Never Used  Substance Use Topics  . Alcohol use: No      Objective:   BP (!) 186/105 (BP Location: Right Arm, Patient Position: Sitting, Cuff Size: Large)   Pulse 85   Temp 98.4 F (36.9 C) (Oral)   Resp 16   Ht 5\' 6"  (1.676 m)   Wt 235 lb (106.6 kg)   BMI 37.93 kg/m  Vitals:   09/10/18 1057  BP: (!) 186/105  Pulse: 85  Resp:  16  Temp: 98.4 F (36.9 C)  TempSrc: Oral  Weight: 235 lb (106.6 kg)  Height: 5\' 6"  (1.676 m)     Physical Exam Vitals signs reviewed.  Constitutional:      General: She is not in acute distress.    Appearance: Normal appearance. She is well-developed. She is obese. She is not ill-appearing or diaphoretic.  HENT:     Head: Normocephalic and atraumatic.  Eyes:     General: No scleral icterus.    Extraocular Movements: Extraocular movements intact.     Conjunctiva/sclera: Conjunctivae normal.  Neck:     Musculoskeletal: Normal range of motion and neck supple.     Thyroid: No thyromegaly.     Vascular: No JVD.     Trachea: No tracheal deviation.  Cardiovascular:     Rate and Rhythm: Normal rate and regular rhythm.     Heart sounds: Normal heart sounds. No murmur. No friction rub. No gallop.   Pulmonary:     Effort: Pulmonary effort is normal. No respiratory distress.     Breath sounds: Normal breath sounds. No wheezing or rales.  Abdominal:     General: Abdomen is protuberant. Bowel sounds are normal.     Palpations: Abdomen  is soft.     Tenderness: There is no abdominal tenderness.     Hernia: A hernia (extremely large, non reducible ventral hernia (possibly multiple);could feel peristalic wave in hernia) is present.  Genitourinary:    Labia:        Right: No rash, tenderness, lesion or injury.        Left: No rash, tenderness, lesion or injury.      Cervix: Discharge (dark red bloody discharge noted ) present.  Lymphadenopathy:     Cervical: No cervical adenopathy.  Neurological:     Mental Status: She is alert.         Assessment & Plan    1. Hernia of anterior abdominal wall Extremely large and most likely complicated hernia of the anterior abdominal wall. Will check labs and get CT as below to better differentiate the hernia prior to surgical referral. She and daughter are in agreement.  - CBC w/Diff/Platelet - Comprehensive Metabolic Panel (CMET) - Lipid  Profile - HgB A1c - CT Abdomen Pelvis Wo Contrast; Future  2. Postmenopausal bleeding Will check labs as below and f/u pending results. Will get pelvic and transvaginal US to see if possible polyp vs thickened endometrium noted. I will f/u pending results. Will refer to GYN for postmenopausal bleeding.  - CBC w/Diff/Platelet - Comprehensive Metabolic Panel (CMET) - Lipid Profile - HgB A1c - US Pelvic Complete With Transvaginal; Future - Ambulatory referral to Gynecology  3. Class 2 severe obesity due to excess calories with serious comorbidity and body mass index (BMI) of 37.0 to 37.9 in adult [facility]) Counseled patient on healthy lifestyle modifications including dieting and exercise.  - CBC w/Diff/Platelet - Comprehensive Metabolic Panel (CMET) - Lipid Profile - HgB A1c  4. Thickened endometrium Noted on [place]. (result attached below) - Ambulatory referral to Gynecology  5. Cysts of both ovaries Noted on [place].  - Ambulatory referral to Gynecology  CLINICAL DATA:  Postmenopausal bleeding for few weeks  EXAM: TRANSABDOMINAL AND TRANSVAGINAL ULTRASOUND OF PELVIS  TECHNIQUE: Both transabdominal and transvaginal ultrasound examinations of the pelvis were performed. Transabdominal technique was performed for global imaging of the pelvis including uterus, ovaries, adnexal regions, and pelvic cul-de-sac. It was necessary to proceed with endovaginal exam following the transabdominal exam to visualize the endometrium.  COMPARISON:  None  FINDINGS: Uterus  Measurements: 9.7 x 3.2 x 4.8 cm = volume: 76.2 mL. Heterogeneous myometrial echogenicity. No discrete mass.  Endometrium  Thickness: 13 mm thick. Abnormal thickened and heterogeneous appearance. No endometrial fluid.  Right ovary  Measurements: 4.9 x 3.7 x 4.8 cm = volume: 45.5 mL. Seen only on transabdominal imaging. Complicated cyst within RIGHT ovary suspect containing a septation.  Left ovary   Measurements: 7.8 x 4.4 x 5.2 cm = volume: 93.2 mL. Cyst identified within LEFT ovary, 5.1 x 3.8 x 4.5 cm. Internal blood flow present on color Doppler imaging.  Other findings  No free pelvic fluid.  No other adnexal masses.  IMPRESSION: Abnormal thickened and heterogeneous endometrial complex 13 mm thick.  In the setting of post-menopausal bleeding, endometrial sampling is indicated to exclude carcinoma. If results are benign, sonohysterogram should be considered for focal lesion work-up. (Ref: Radiological Reasoning: Algorithmic Workup of Abnormal Vaginal Bleeding with Endovaginal Sonography and Sonohysterography. AJR 2008; 450:T88-82)  Cystic lesions of both ovaries, question complicated, incompletely characterized; further characterization of these lesions by MR imaging recommended.   Electronically Signed   By: Lavonia Dana M.D.   On: 09/14/2018 14:01  Mar Daring, PA-C  [facility]

## 2018-09-11 ENCOUNTER — Telehealth: Payer: Self-pay

## 2018-09-11 LAB — COMPREHENSIVE METABOLIC PANEL
ALT: 16 IU/L (ref 0–32)
AST: 21 IU/L (ref 0–40)
Albumin/Globulin Ratio: 2 (ref 1.2–2.2)
Albumin: 4.3 g/dL (ref 3.8–4.8)
Alkaline Phosphatase: 122 IU/L — ABNORMAL HIGH (ref 39–117)
BUN/Creatinine Ratio: 14 (ref 12–28)
BUN: 12 mg/dL (ref 8–27)
Bilirubin Total: 0.4 mg/dL (ref 0.0–1.2)
CO2: 21 mmol/L (ref 20–29)
Calcium: 9.4 mg/dL (ref 8.7–10.3)
Chloride: 106 mmol/L (ref 96–106)
Creatinine, Ser: 0.87 mg/dL (ref 0.57–1.00)
GFR calc Af Amer: 81 mL/min/{1.73_m2} (ref >59)
GFR calc non Af Amer: 70 mL/min/{1.73_m2} (ref >59)
Globulin, Total: 2.2 g/dL (ref 1.5–4.5)
Glucose: 97 mg/dL (ref 65–99)
Potassium: 4.5 mmol/L (ref 3.5–5.2)
Sodium: 146 mmol/L — ABNORMAL HIGH (ref 134–144)
Total Protein: 6.5 g/dL (ref 6.0–8.5)

## 2018-09-11 LAB — CBC WITH DIFFERENTIAL/PLATELET
Basophils Absolute: 0.1 10*3/uL (ref 0.0–0.2)
Basos: 1 %
EOS (ABSOLUTE): 0.3 10*3/uL (ref 0.0–0.4)
Eos: 2 %
Hematocrit: 45.1 % (ref 34.0–46.6)
Hemoglobin: 14.9 g/dL (ref 11.1–15.9)
Immature Grans (Abs): 0 10*3/uL (ref 0.0–0.1)
Immature Granulocytes: 0 %
Lymphocytes Absolute: 1.8 10*3/uL (ref 0.7–3.1)
Lymphs: 17 %
MCH: 27.9 pg (ref 26.6–33.0)
MCHC: 33 g/dL (ref 31.5–35.7)
MCV: 85 fL (ref 79–97)
Monocytes Absolute: 0.7 10*3/uL (ref 0.1–0.9)
Monocytes: 6 %
Neutrophils Absolute: 7.8 10*3/uL — ABNORMAL HIGH (ref 1.4–7.0)
Neutrophils: 74 %
Platelets: 185 10*3/uL (ref 150–450)
RBC: 5.34 x10E6/uL — ABNORMAL HIGH (ref 3.77–5.28)
RDW: 13.6 % (ref 11.7–15.4)
WBC: 10.6 10*3/uL (ref 3.4–10.8)

## 2018-09-11 LAB — LIPID PANEL
Chol/HDL Ratio: 2.7 ratio (ref 0.0–4.4)
Cholesterol, Total: 131 mg/dL (ref 100–199)
HDL: 48 mg/dL (ref >39)
LDL Calculated: 64 mg/dL (ref 0–99)
Triglycerides: 93 mg/dL (ref 0–149)
VLDL Cholesterol Cal: 19 mg/dL (ref 5–40)

## 2018-09-11 LAB — HEMOGLOBIN A1C
Est. average glucose Bld gHb Est-mCnc: 120 mg/dL
Hgb A1c MFr Bld: 5.8 % — ABNORMAL HIGH (ref 4.8–5.6)

## 2018-09-11 NOTE — Telephone Encounter (Signed)
-----  Message from Mar Daring, [place] sent at 09/11/2018  1:13 PM EDT ----- Blood count is normal and stable. Kidney and liver function is normal. Alk phos is only borderline high. We will monitor this with routine labs. Cholesterol is perfect. A1c is borderline high at 5.8.

## 2018-09-11 NOTE — Telephone Encounter (Signed)
Viewed by Votaw Latino on 09/11/2018 1:15 PM  Written by Mar Daring, PA-C on 09/11/2018 1:13 PM  Blood count is normal and stable. Kidney and liver function is normal. Alk phos is only borderline high. We will monitor this with routine labs. Cholesterol is perfect. A1c is borderline high at 5.8.

## 2018-09-14 ENCOUNTER — Ambulatory Visit
Admission: RE | Admit: 2018-09-14 | Discharge: 2018-09-14 | Disposition: A | Discharge status: Home / Self Care | Payer: Commercial Managed Care - PPO | Source: Ambulatory Visit | Attending: Physician Assistant | Admitting: Physician Assistant

## 2018-09-14 ENCOUNTER — Other Ambulatory Visit: Payer: Self-pay

## 2018-09-14 DIAGNOSIS — N950 Postmenopausal bleeding: Secondary | ICD-10-CM | POA: Insufficient documentation

## 2018-09-14 IMAGING — US US PELVIS COMPLETE WITH TRANSVAGINAL
1 series · 13 of 25 positions shown · non-contrast
Comparison: None

CLINICAL DATA: Postmenopausal bleeding for few weeks



[Series 1: us pelvis complete with transvaginal · 108 acquisitions, 13 frames shown]
[im 1/108]
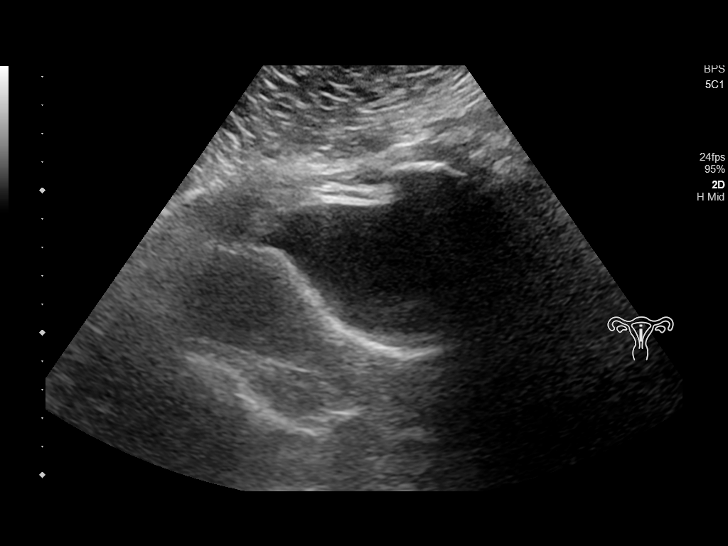
[im 9/108]
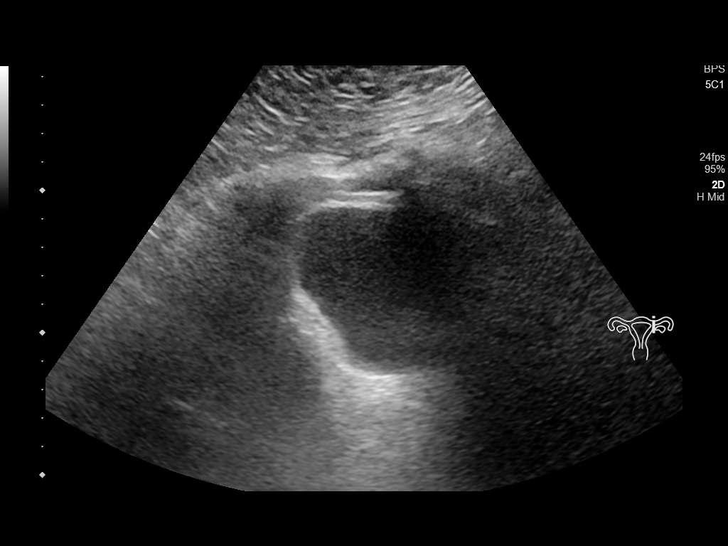
[im 18/108]
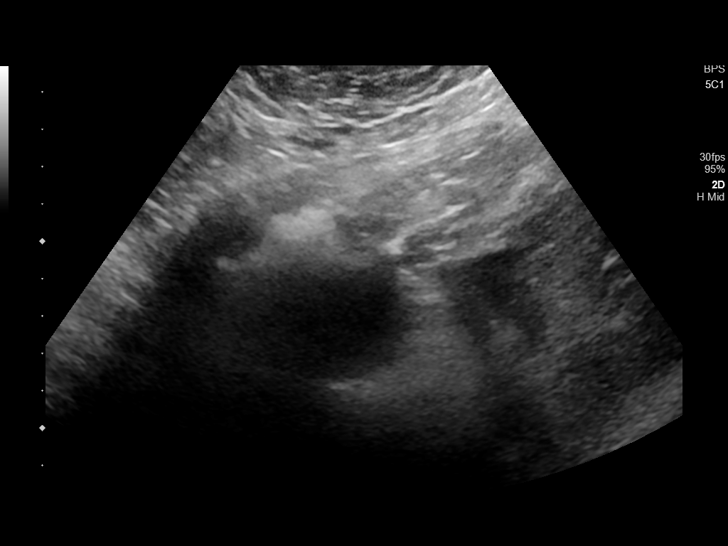
[im 27/108]
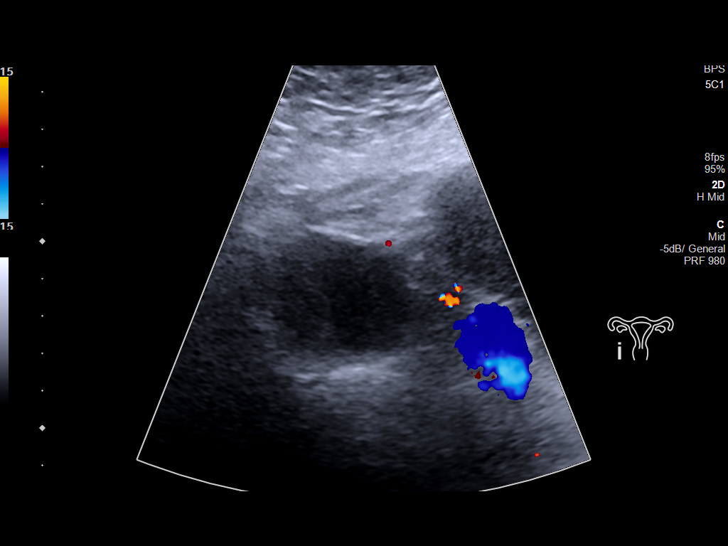
[im 36/108]
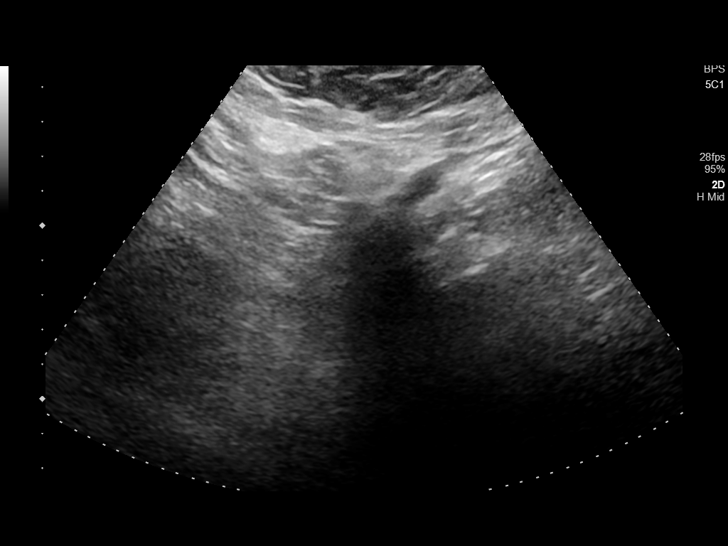
[im 45/108]
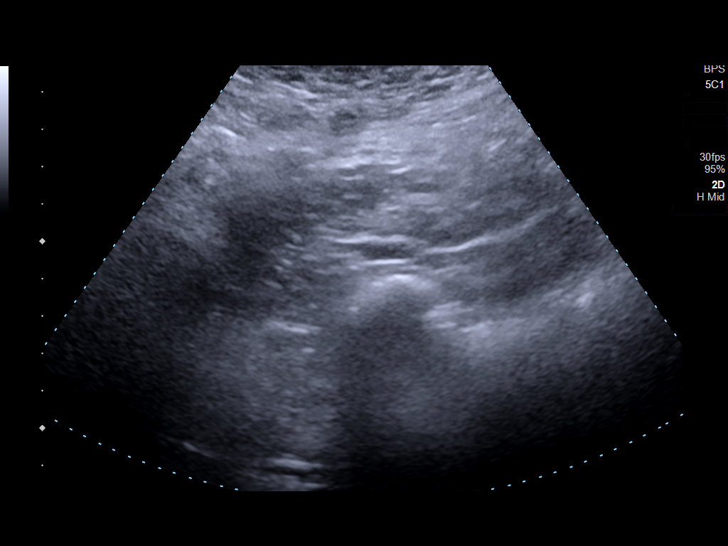
[im 54/108]
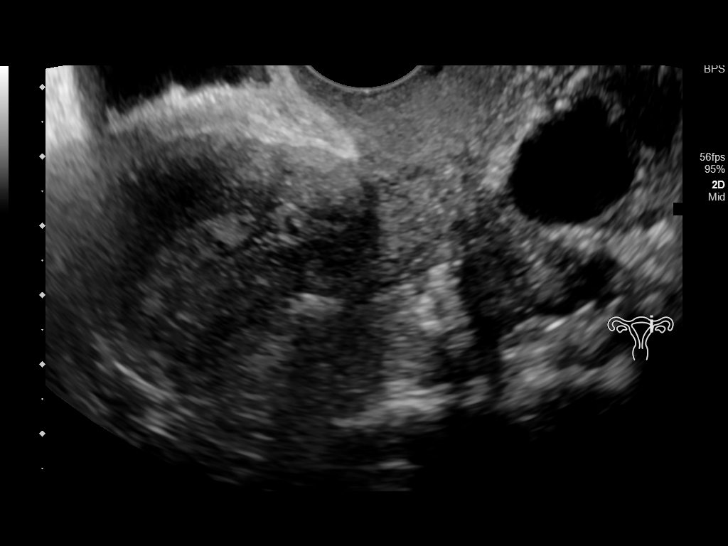
[im 63/108]
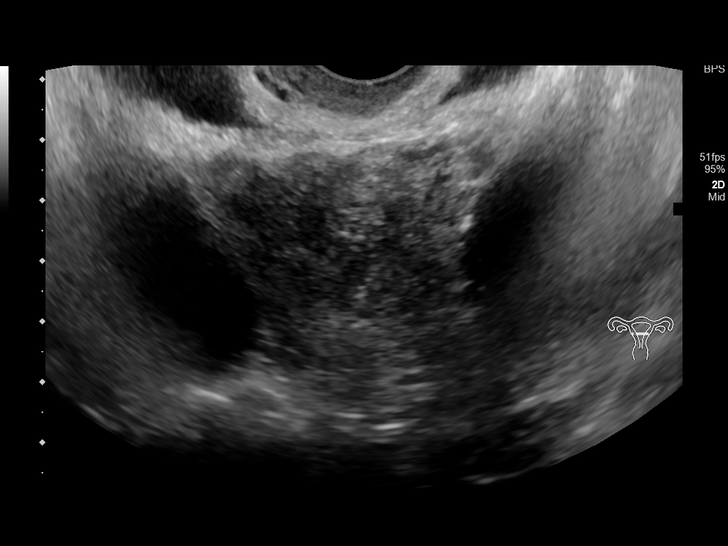
[im 72/108]
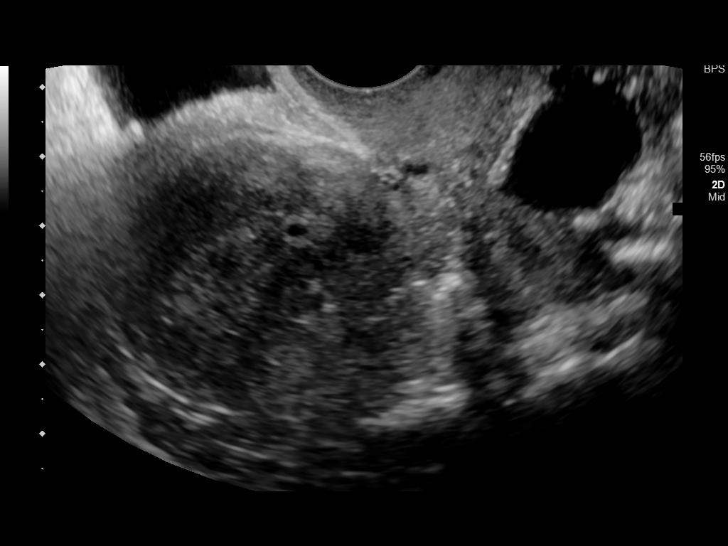
[im 81/108]
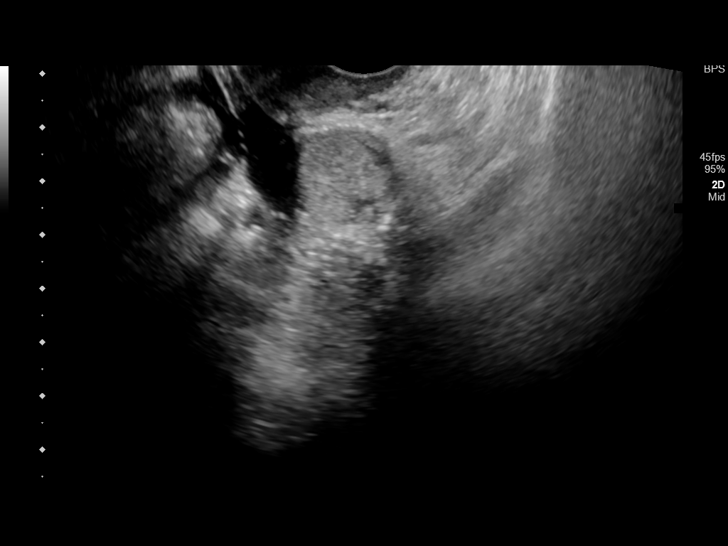
[im 90/108]
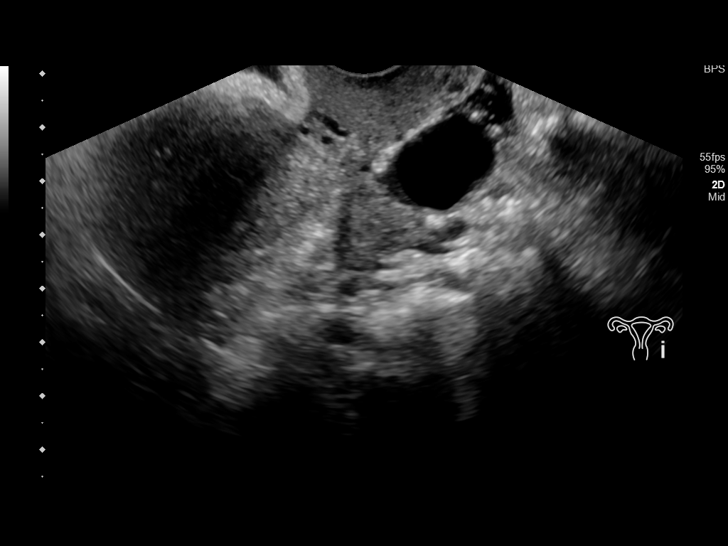
[im 99/108]
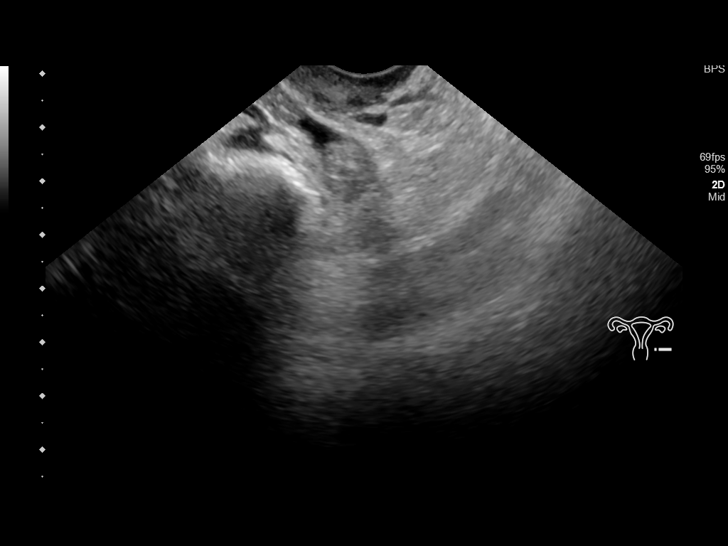
[im 108/108]
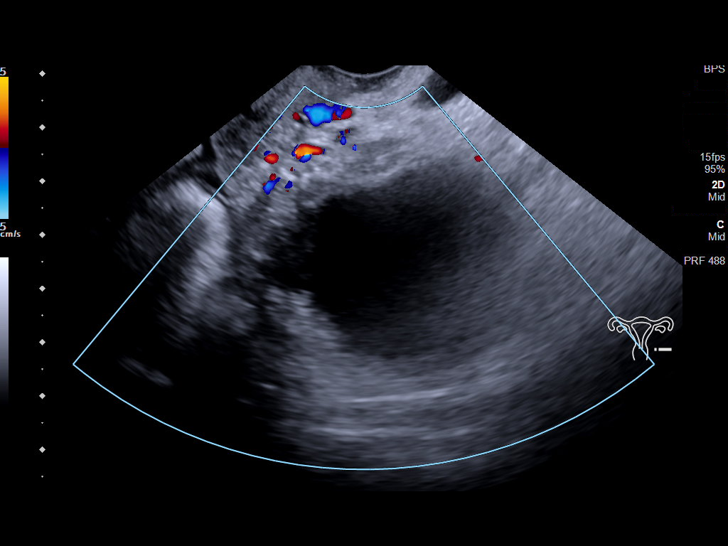

[13 of 25 positions shown; findings below may reference images not displayed]

FINDINGS: Uterus

Measurements: 9.7 x 3.2 x 4.8 cm = volume: 76.2 mL. Heterogeneous
myometrial echogenicity. No discrete mass.

Endometrium

Thickness: 13 mm thick. Abnormal thickened and heterogeneous
appearance. No endometrial fluid.

Right ovary

Measurements: 4.9 x 3.7 x 4.8 cm = volume: 45.5 mL. Seen only on
transabdominal imaging. Complicated cyst within RIGHT ovary suspect
containing a septation.

Left ovary

Measurements: 7.8 x 4.4 x 5.2 cm = volume: 93.2 mL. Cyst identified
within LEFT ovary, 5.1 x 3.8 x 4.5 cm. Internal blood flow present
on color Doppler imaging.

Other findings

No free pelvic fluid.  No other adnexal masses.
IMPRESSION: Abnormal thickened and heterogeneous endometrial complex 13 mm
thick.

In the setting of post-menopausal bleeding, endometrial sampling is
indicated to exclude carcinoma. If results are benign,
sonohysterogram should be considered for focal lesion work-up. (Ref:
Radiological Reasoning: Algorithmic Workup of Abnormal Vaginal
Bleeding with Endovaginal Sonography and Sonohysterography. AJR
[29]; 191:S68-73)

Cystic lesions of both ovaries, question complicated, incompletely
characterized; further characterization of these lesions by MR
imaging recommended.

## 2018-09-17 ENCOUNTER — Telehealth: Payer: Self-pay | Admitting: *Deleted

## 2018-09-17 NOTE — Telephone Encounter (Signed)
Patient advised as below.  

## 2018-09-17 NOTE — Telephone Encounter (Signed)
LMOVM for pt to return call 

## 2018-09-17 NOTE — Telephone Encounter (Signed)
-----   Message from Mar Daring, [place] sent at 09/14/2018  4:20 PM EDT ----- Pelvic and transvaginal US reveal an enlarged (thickened) endometrium and cyst noted bilaterally on the ovaries. I highly recommend GYN referral for further evaluation. I am placing referral at this time.

## 2018-09-17 NOTE — Telephone Encounter (Signed)
Patient wants referral to surgery ASAP.

## 2018-09-17 NOTE — Telephone Encounter (Signed)
Please review

## 2018-09-17 NOTE — Telephone Encounter (Signed)
I have already placed GYN referral. Is she also wanting gen surg referral for the hernia?

## 2018-09-18 ENCOUNTER — Other Ambulatory Visit: Payer: Self-pay

## 2018-09-18 ENCOUNTER — Telehealth: Payer: Self-pay | Admitting: Obstetrics & Gynecology

## 2018-09-18 ENCOUNTER — Ambulatory Visit
Admission: RE | Admit: 2018-09-18 | Discharge: 2018-09-18 | Disposition: A | Discharge status: Home / Self Care | Payer: Commercial Managed Care - PPO | Source: Ambulatory Visit | Attending: Physician Assistant | Admitting: Physician Assistant

## 2018-09-18 DIAGNOSIS — K439 Ventral hernia without obstruction or gangrene: Secondary | ICD-10-CM | POA: Diagnosis present

## 2018-09-18 IMAGING — CT CT ABDOMEN AND PELVIS WITHOUT CONTRAST
2 of 4 series · 16 of 46 positions shown, 18 images · non-contrast
Comparison: CT from [U9] could not be retrieved for comparison.

CLINICAL DATA: Known hernia now with pain.

EXAM:
CT ABDOMEN AND PELVIS WITHOUT CONTRAST
TECHNIQUE: Multidetector CT imaging of the abdomen and pelvis was performed
following the standard protocol without IV contrast.

[Series 2: routine abd/pel wo · axial · 0.89mm/px · z∈[-493,-98]mm · 13 of 87 slices shown, 15 images]
[im 4/87  soft-tissue]
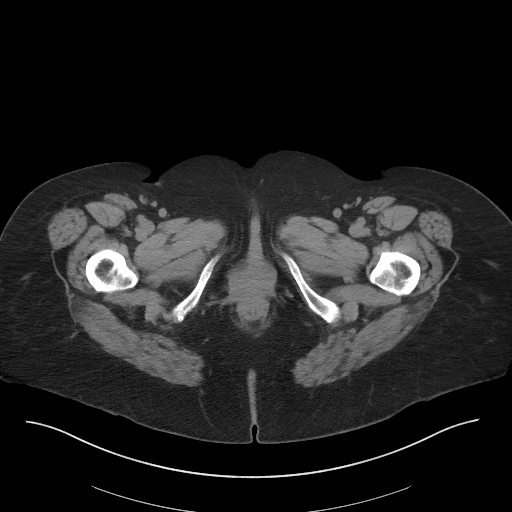
[im 4/87  bone]
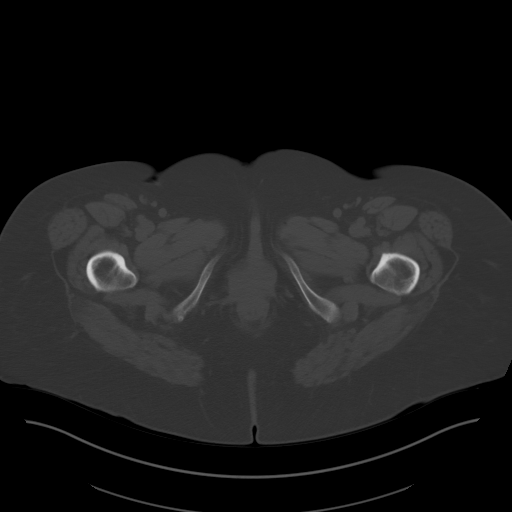
[im 11/87  soft-tissue]
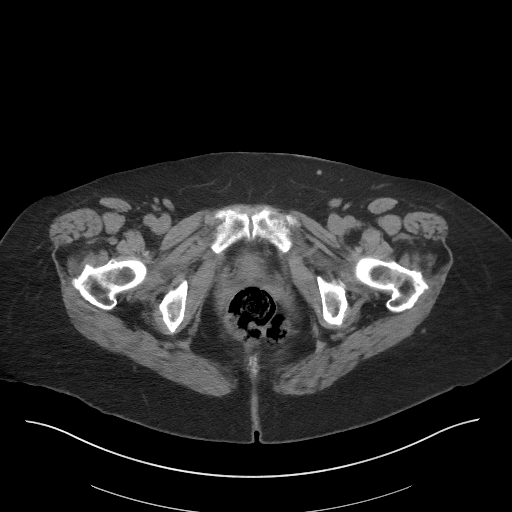
[im 18/87  soft-tissue]
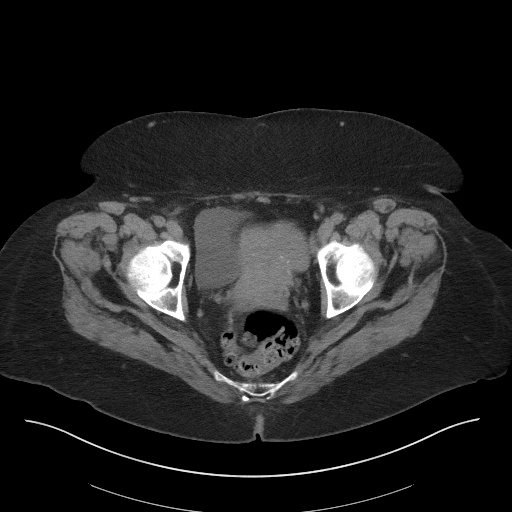
[im 25/87  soft-tissue]
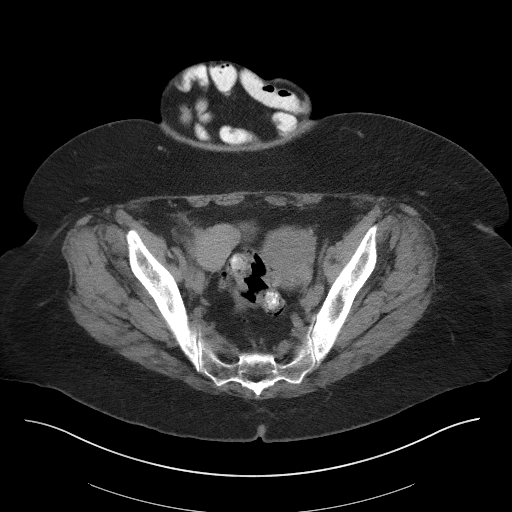
[im 31/87  soft-tissue]
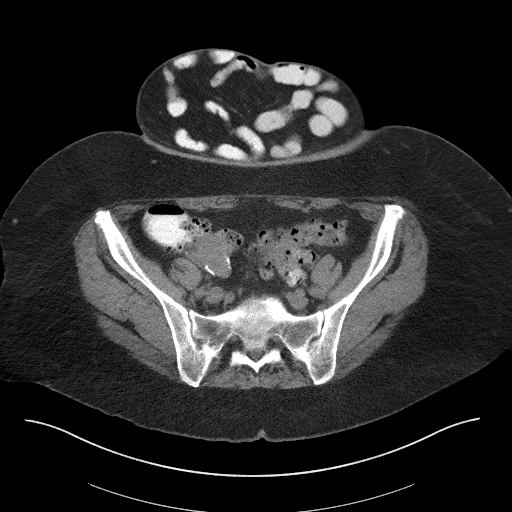
[im 38/87  soft-tissue]
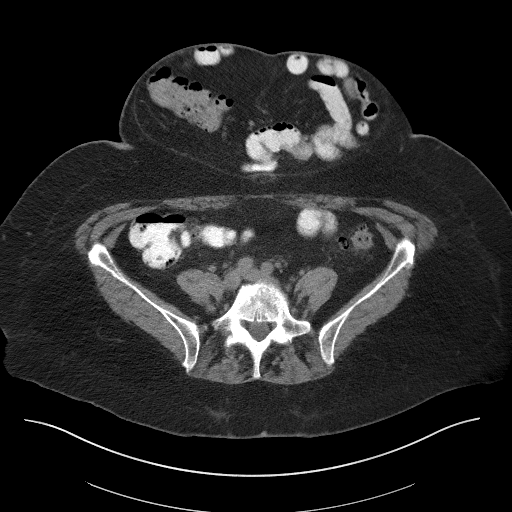
[im 45/87  soft-tissue]
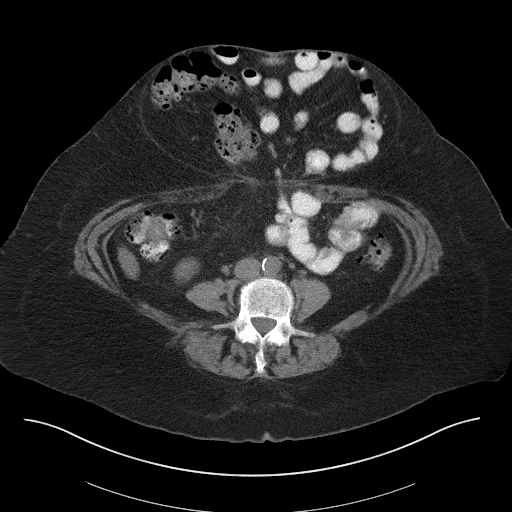
[im 49/87  soft-tissue]
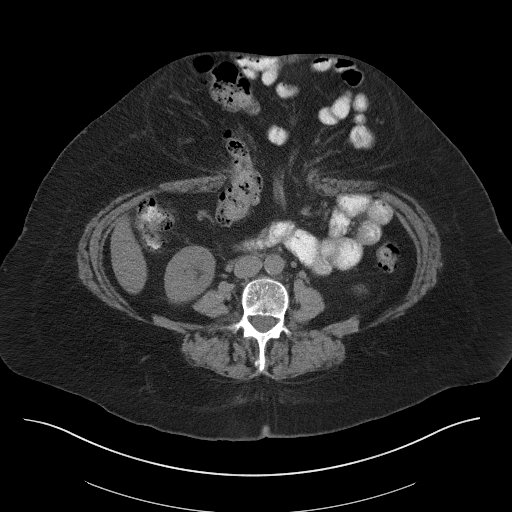
[im 56/87  soft-tissue]
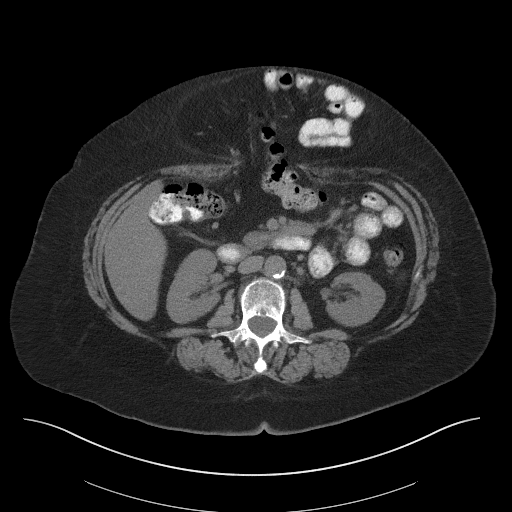
[im 56/87  bone]
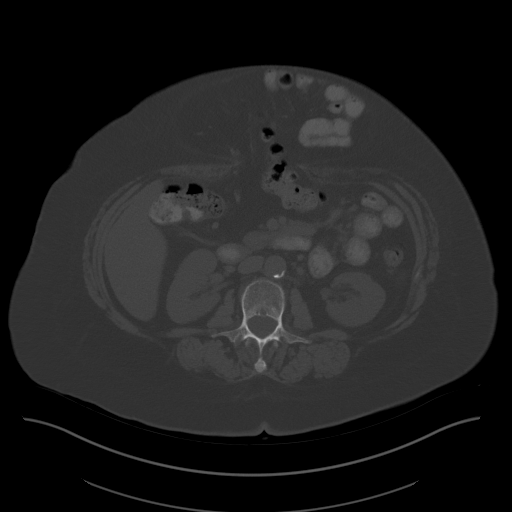
[im 62/87  soft-tissue]
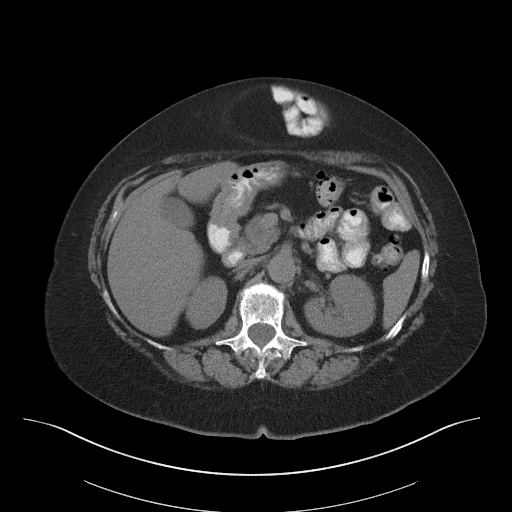
[im 69/87  soft-tissue]
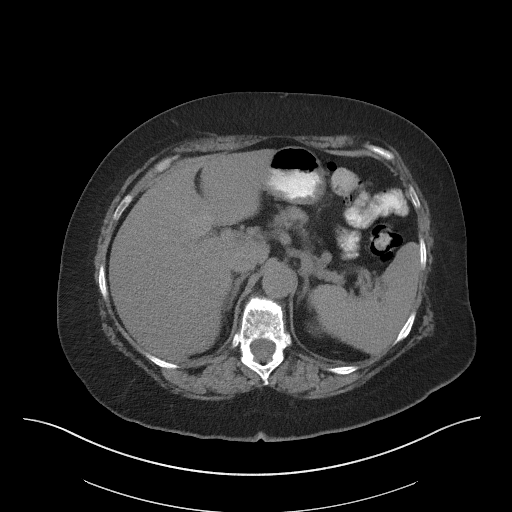
[im 76/87  soft-tissue]
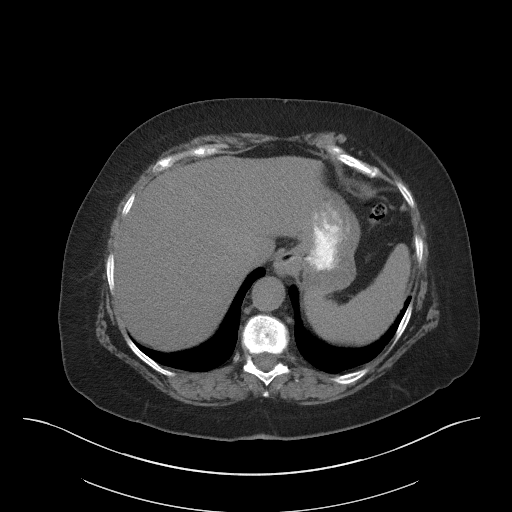
[im 83/87  soft-tissue]
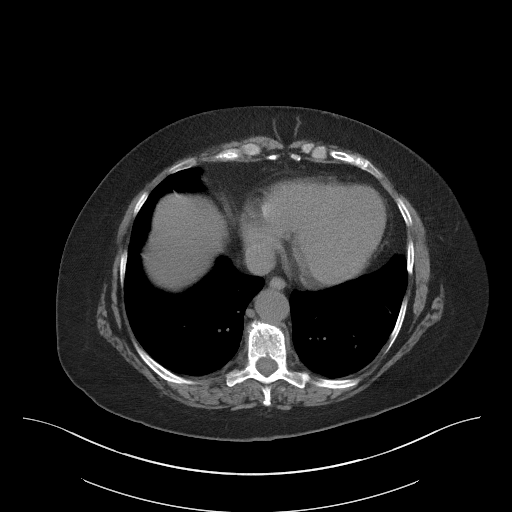

[Series 5: coronal st · coronal · 0.88mm/px · 3 of 120 slices shown]
[im 40/120  soft-tissue]
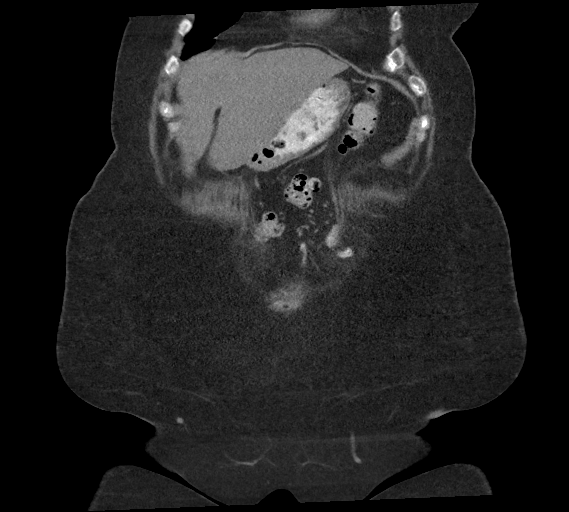
[im 53/120  soft-tissue]
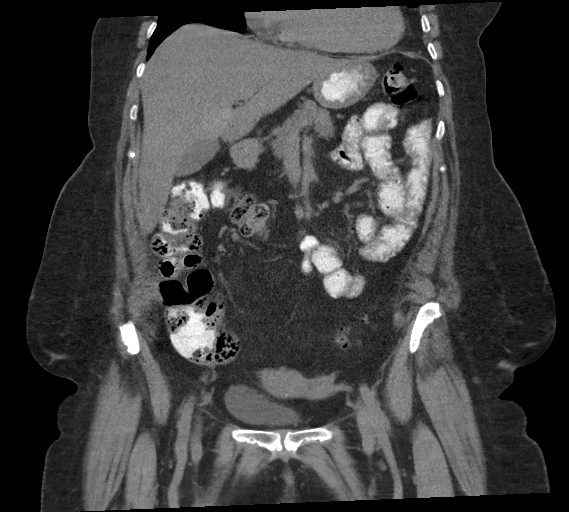
[im 67/120  soft-tissue]
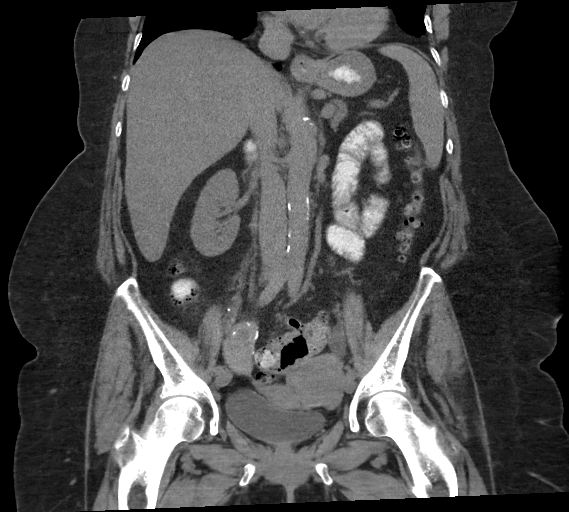

[16 of 46 positions shown; findings below may reference images not displayed]

FINDINGS: Lower chest: No acute abnormality.

Hepatobiliary: There is a patent steatosis. No discrete hepatic
mass. The gallbladder is unremarkable.

Pancreas: Unremarkable. No pancreatic ductal dilatation or
surrounding inflammatory changes.

Spleen: Normal in size without focal abnormality.

Adrenals/Urinary Tract: There is a hyperdense 0.9 cm nodule in the
medial upper pole the right kidney. There is no hydronephrosis.
There are no radiopaque obstructing kidney stones. The bladder is
unremarkable.

Stomach/Bowel: There is sigmoid diverticulosis without CT evidence
of diverticulitis. The stomach is unremarkable. There is no evidence
of a small-bowel obstruction. The appendix is not reliably
identified.

Vascular/Lymphatic: Aortic atherosclerosis. No enlarged abdominal or
pelvic lymph nodes.

Reproductive: There are bilateral complex ovarian masses. The right
ovarian mass measures approximately 4.9 by 3.9 cm while the left
ovarian mass measures at least 8.6 by 4.9 cm. The uterus is grossly
unremarkable.

Other: There is a large ventral wall hernia containing both loops of
colon and small bowel. The abdominal wall defect measures
approximately 7.1 cm TV by 8.3 cm SI. There is no evidence of a
bowel obstruction secondary to this hernia. There is no CT evidence
of strangulation or incarceration. There is no free fluid within the
hernia.

Musculoskeletal: No acute or significant osseous findings.
IMPRESSION: 1. Large ventral wall hernia as detailed above containing both loops
of colon and small bowel without evidence of an obstruction or
strangulation/incarceration.
2. Bilateral ovarian masses. These are not well characterized on
this exam. Outpatient follow-up is recommended. These were not well
characterized on the recent pelvic ultrasound. Consider outpatient
gynecology follow-up plus a contrast enhanced MRI.
3. Hepatic steatosis.
4. Indeterminate 0.9 cm hyperdense nodule in the upper pole of the
left kidney. This is favored to represent a proteinaceous or
hemorrhagic cyst, however it is not well characterized on this exam.
Follow-up with a nonemergent renal ultrasound is recommended for
further evaluation of this finding.

Aortic Atherosclerosis ([U9]-[U9]).

## 2018-09-18 NOTE — Telephone Encounter (Signed)
[facility] referring for Postmenopausal bleeding, Thickened endometrium, Cysts of both ovaries. Called and left voicemail for patient to call back to be schedule

## 2018-09-19 ENCOUNTER — Encounter: Payer: Self-pay | Admitting: Physician Assistant

## 2018-09-19 DIAGNOSIS — K439 Ventral hernia without obstruction or gangrene: Secondary | ICD-10-CM

## 2018-09-19 NOTE — Telephone Encounter (Signed)
-----   Message from Mar Daring, [place] sent at 09/19/2018  8:56 AM EDT ----- CT does show some fatty liver changes. Also there is diverticulosis noted in the sigmoid colon, not infected. Large hernia noted containing fat, small bowel and large bowel. It is not obstructed or strangulated at this time. If desired can refer to gen surg for further evaluation of this as well.

## 2018-09-19 NOTE — Telephone Encounter (Signed)
lmtcb

## 2018-09-19 NOTE — Telephone Encounter (Signed)
Patient is schedule 09/28/18

## 2018-09-20 NOTE — Telephone Encounter (Signed)
Patient was advised.  

## 2018-09-27 ENCOUNTER — Encounter: Payer: Self-pay | Admitting: Physician Assistant

## 2018-09-28 ENCOUNTER — Other Ambulatory Visit (HOSPITAL_COMMUNITY)
Admission: RE | Admit: 2018-09-28 | Discharge: 2018-09-28 | Disposition: A | Discharge status: Home / Self Care | Payer: Commercial Managed Care - PPO | Source: Ambulatory Visit | Attending: Obstetrics and Gynecology | Admitting: Obstetrics and Gynecology

## 2018-09-28 ENCOUNTER — Other Ambulatory Visit: Payer: Commercial Managed Care - PPO

## 2018-09-28 ENCOUNTER — Telehealth: Payer: Self-pay

## 2018-09-28 ENCOUNTER — Ambulatory Visit: Payer: Commercial Managed Care - PPO | Admitting: Surgery

## 2018-09-28 ENCOUNTER — Other Ambulatory Visit: Payer: Self-pay

## 2018-09-28 ENCOUNTER — Encounter: Payer: Self-pay | Admitting: Surgery

## 2018-09-28 ENCOUNTER — Ambulatory Visit (INDEPENDENT_AMBULATORY_CARE_PROVIDER_SITE_OTHER): Payer: Commercial Managed Care - PPO | Admitting: Obstetrics and Gynecology

## 2018-09-28 ENCOUNTER — Encounter: Payer: Self-pay | Admitting: Obstetrics and Gynecology

## 2018-09-28 ENCOUNTER — Telehealth: Payer: Self-pay | Admitting: *Deleted

## 2018-09-28 VITALS — BP 160/90 | HR 82 | Temp 98.1°F | Resp 16 | Ht 66.0 in | Wt 234.0 lb

## 2018-09-28 VITALS — BP 150/100 | HR 88 | Ht 66.0 in | Wt 235.0 lb

## 2018-09-28 DIAGNOSIS — R9389 Abnormal findings on diagnostic imaging of other specified body structures: Secondary | ICD-10-CM

## 2018-09-28 DIAGNOSIS — Z124 Encounter for screening for malignant neoplasm of cervix: Secondary | ICD-10-CM

## 2018-09-28 DIAGNOSIS — K439 Ventral hernia without obstruction or gangrene: Secondary | ICD-10-CM

## 2018-09-28 DIAGNOSIS — N83201 Unspecified ovarian cyst, right side: Secondary | ICD-10-CM | POA: Diagnosis not present

## 2018-09-28 DIAGNOSIS — Z20822 Contact with and (suspected) exposure to covid-19: Secondary | ICD-10-CM

## 2018-09-28 DIAGNOSIS — N83202 Unspecified ovarian cyst, left side: Secondary | ICD-10-CM | POA: Diagnosis not present

## 2018-09-28 DIAGNOSIS — N939 Abnormal uterine and vaginal bleeding, unspecified: Secondary | ICD-10-CM | POA: Diagnosis not present

## 2018-09-28 DIAGNOSIS — N83299 Other ovarian cyst, unspecified side: Secondary | ICD-10-CM

## 2018-09-28 NOTE — Telephone Encounter (Signed)
Patient called stating she needs a covid test. Patient states she had new symptoms of stuffy nose on Monday at work. Patient's job is making her get tested due to symptoms. Please advise?

## 2018-09-28 NOTE — Telephone Encounter (Signed)
Patient with cough, rhinorrhea. No fevers, chills, or GI symptoms. Patient is 68 yr old. Age is only risk factor. Covid testing requested by her employer. Covid risk of complications is 3.

## 2018-09-28 NOTE — Telephone Encounter (Signed)
See mychart message please. Already taken care of

## 2018-09-28 NOTE — Progress Notes (Signed)
09/28/2018  Reason for Visit:  Ventral hernia  Referring Provider:  Fenton Malling, PA-C  History of Present Illness: Robin Adams is a 68 y.o. female presenting for evaluation of a large ventral hernia.  The patient reports she has had this for at least 3-4 years, but over the past year has been getting worse with increased size and increased symptoms.  She reports that due to the large bulging, she feels she has to "carry" her hernia, and it causes her to hunch over forward which causes in turn back pain and also bilateral hip and knee pain.    She also has been having vaginal bleeding.  She had ultrasound and CT scan recently which are showing bilateral ovarian masses.  She saw Dr. Gilman Schmidt with OB/GYN today and had endometrial biopsy, PAP smear, and lab workup for ovarian malignancy risk.    She presents to our office for evaluation of her ventral hernia.  She has had two c-sections in the past, tubal ligation, and right arm surgery.  Past Medical History: Past Medical History:  Diagnosis Date  . Hernia, ventral   . Hypertension      Past Surgical History: Past Surgical History:  Procedure Laterality Date  . CESAREAN SECTION x 2    . FRACTURE SURGERY -- right arm Right   . TUBAL LIGATION      Home Medications: Prior to Admission medications   Medication Sig Start Date End Date Taking? Authorizing Provider  amLODipine (NORVASC) 5 MG tablet Take 1 tablet (5 mg total) by mouth daily. 06/06/17  Yes Trinna Post, PA-C  Multiple Vitamin (MULTI-VITAMIN PO) Take by mouth.   Yes [provider]    Allergies: Allergies  Allergen Reactions  . Tetanus Toxoids Hives    Social History:  reports that she has been smoking cigarettes. She has a 50.00 pack-year smoking history. She has never used smokeless tobacco. She reports that she does not drink alcohol or use drugs.   Family History: Family History  Problem Relation Age of Onset  . Kyphosis Mother   . Alcohol  abuse Father   . Liver disease Father   . Kidney failure Father   . Thyroid disease Maternal Grandmother   . Breast cancer Paternal Grandmother   . Heart disease Paternal Grandfather   . Rheum arthritis Daughter     Review of Systems: Review of Systems  Constitutional: Negative for chills and fever.  HENT: Negative for hearing loss.   Eyes: Negative for blurred vision.  Respiratory: Negative for cough and shortness of breath.   Cardiovascular: Negative for chest pain.  Gastrointestinal: Positive for abdominal pain (tenderness over left side of hernia depending on activity). Negative for constipation, diarrhea, nausea and vomiting.  Genitourinary: Negative for dysuria.  Musculoskeletal: Positive for back pain and joint pain (hip and knee).  Skin: Negative for rash.  Neurological: Negative for dizziness.  Psychiatric/Behavioral: Negative for depression.    Physical Exam BP (!) 160/90   Pulse 82   Temp 98.1 F (36.7 C) (Temporal)   Resp 16   Ht 5' 6"  (1.676 m)   Wt 234 lb (106.1 kg)   SpO2 97%   BMI 37.77 kg/m  CONSTITUTIONAL: No acute distress HEENT:  Normocephalic, atraumatic, extraocular motion intact. NECK: Trachea is midline, and there is no jugular venous distension.  RESPIRATORY:  Lungs are clear, and breath sounds are equal bilaterally. Normal respiratory effort without pathologic use of accessory muscles. CARDIOVASCULAR: Heart is regular without murmurs, gallops, or rubs. GI:  The abdomen is soft, obese, non-distended, with mild tenderness to palpation over left side of hernia.  Patient has a very large midline hernia that involves her pannus as well.  The hernia is not reducible but there is no evidence of strangulation.  The overlying skin is normal without any ulceration or thinning.  MUSCULOSKELETAL:  Normal muscle strength and tone in all four extremities.  No peripheral edema or cyanosis. SKIN: Skin turgor is normal. There are no pathologic skin lesions.   NEUROLOGIC:  Motor and sensation is grossly normal.  Cranial nerves are grossly intact. PSYCH:  Alert and oriented to person, place and time. Affect is normal.  Laboratory Analysis: Labs 09/10/18: Na 146, K 4.5, Cl 106, CO2 21, BUN 12, Cr 0.87.   Tbili 0.4, AST 21, ALT 16, Alk phos 122.   LDL 64, HDL 48, Cholesterol 131, Trigs 93.   WBC 10.6, Hgb 14.9, Hct 45.1, Plt 185. Hg A1C 5.8  Imaging: CT scan 09/18/18: FINDINGS: Lower chest: No acute abnormality.  Hepatobiliary: There is a patent steatosis. No discrete hepatic mass. The gallbladder is unremarkable.  Pancreas: Unremarkable. No pancreatic ductal dilatation or surrounding inflammatory changes.  Spleen: Normal in size without focal abnormality.  Adrenals/Urinary Tract: There is a hyperdense 0.9 cm nodule in the medial upper pole the right kidney. There is no hydronephrosis. There are no radiopaque obstructing kidney stones. The bladder is unremarkable.  Stomach/Bowel: There is sigmoid diverticulosis without CT evidence of diverticulitis. The stomach is unremarkable. There is no evidence of a small-bowel obstruction. The appendix is not reliably identified.  Vascular/Lymphatic: Aortic atherosclerosis. No enlarged abdominal or pelvic lymph nodes.  Reproductive: There are bilateral complex ovarian masses. The right ovarian mass measures approximately 4.9 by 3.9 cm while the left ovarian mass measures at least 8.6 by 4.9 cm. The uterus is grossly unremarkable.  Other: There is a large ventral wall hernia containing both loops of colon and small bowel. The abdominal wall defect measures approximately 7.1 cm TV by 8.3 cm SI. There is no evidence of a bowel obstruction secondary to this hernia. There is no CT evidence of strangulation or incarceration. There is no free fluid within the hernia.  Musculoskeletal: No acute or significant osseous findings.  IMPRESSION: 1. Large ventral wall hernia as detailed above containing both  loops of colon and small bowel without evidence of an obstruction or strangulation/incarceration. 2. Bilateral ovarian masses. These are not well characterized on this exam. Outpatient follow-up is recommended. These were not well characterized on the recent pelvic ultrasound. Consider outpatient gynecology follow-up plus a contrast enhanced MRI. 3. Hepatic steatosis. 4. Indeterminate 0.9 cm hyperdense nodule in the upper pole of the left kidney. This is favored to represent a proteinaceous or hemorrhagic cyst, however it is not well characterized on this exam. Follow-up with a nonemergent renal ultrasound is recommended for further evaluation of this finding.   Assessment and Plan: This is a 68 y.o. female with large ventral hernia.  Discussed with the patient that she has a complex hernia given the size of the hernia defect and the fact that a large component of her intestines are bulging, which makes the rectus muscles retract and decreases the true intraperitoneal space.  As such, she would require a major abdominal wall reconstruction via TAR release and/or anterior component separation.    She has the benefit of being healthy overall with few comorbidities.  However, her obesity with BMI of 37.8 and smoking 1 pack per day do increase the  risk of complications.  Given the extent of surgery that she would need for her hernia, it is more prudent to optimize her in order to have the best outcome for her and not have a hernia recurrence, wound infections, mesh infections, or other complications.  As such, I have discussed with her that she needs to lose weight and to quit smoking.  She currently weighs 234 lbs today, and if she goes down to 200 lbs, that would put her at BMI of 32 which would be easier to deal with her surgery.  Discussed with her the significant benefits of quitting smoking when it comes to blood flow to the wound, oxygenation of tissue, wound healing, and the immune system.    Though  understandably disappointed, about having to wait for surgery, she understands the reasoning for not rushing through this.  I offered to give her a prescription for Nicotine patch but she declined.  I also offered her a second opinion and that we can set up a referral with [facility], [facility], or [facility].  My thought is that they would have a similar if not same explanation and goals for her prior to any surgery.  She will think about it and let us know if she would like a referral to them.  Otherwise, her plan is to lose weight and quit smoking and will follow up with me in three months.  Currently, there are no other surgical plans from the OB/GYN standpoint, but could potentially coordinate any ovarian surgery with her hernia repair.  Face-to-face time spent with the patient and care providers was 60 minutes, with more than 50% of the time spent counseling, educating, and coordinating care of the patient.     Melvyn Neth, [facility]

## 2018-09-28 NOTE — Telephone Encounter (Signed)
Patient called and advised of the request for covid testing, she verbalized understanding. Appointment scheduled for today at 1515 at [facility], advised of location and to wear a mask for everyone in the vehicle, she verbalized understanding. Order placed.    Message sent from [place], PA-C to [place], [place] at 09/28/2018 2:09 PM -----   I will send referral for the covid testing but they will call to schedule an appt time.     Grace Bushy, PA-C

## 2018-09-28 NOTE — Progress Notes (Signed)
Patient ID: Robin Adams, female   DOB: 1950/05/08, 68 y.o.   MRN: [id number]  Reason for Consult: Referral (Bleeding started x2 weeks ago, light spotting )   Referred by Mar Daring, P*  Subjective:     HPI:  Robin Adams is a 68 y.o. female . She presents today for a referral for postmenopausal bleeding. She has been having brown discharge that she noticed on her pad that she wears for urinary incontinence. She realized that it was vaginal bleeding.  She was seen by her PCP who ordered her an [place] and a CT.  This has been her first episode of vaginal bleeding since menopause.   She is overall very skeptical of healthcare. Believes that she has remained in good health for 65 years because she has avoiding going to the doctor's office. She is proud that she does not take medications that have side effects. She repeated several times that "everything is connected" and that when one thing causes a problem it lead to others. She references her hernia and how it is leading to back issues and subsequently hip issues, and bladder issues and so on. She wonders if the hernia is causing issues with her uterus and ovaries.   OB History H x of 1 vaginal delivery and 2 cesarean deliveries. Once complicated by incisional hernia.   GYN History Menarche: [age] Menopause: 41 Hx of regular monthly periods. Lasting 3-6 days.  History of dysmenorrhea Not currently sexually acitve.  Denies history of fibroids or ovarian cysts Remote hisotry of a STD, uncertain of what type.  Last pap smear more than 10 years ago.     Past Medical History:  Diagnosis Date  . Hernia, hiatal   . Hypertension    Family History  Problem Relation Age of Onset  . Kyphosis Mother   . Alcohol abuse Father   . Liver disease Father   . Kidney failure Father   . Thyroid disease Maternal Grandmother   . Breast cancer Paternal Grandmother   . Heart disease Paternal Grandfather   . Rheum arthritis Daughter    Past  Surgical History:  Procedure Laterality Date  . CESAREAN SECTION    . FRACTURE SURGERY Right   . TUBAL LIGATION      Short Social History:  Social History   Tobacco Use  . Smoking status: Current Every Day Smoker    Packs/day: 1.00    Years: 50.00    Pack years: 50.00    Types: Cigarettes  . Smokeless tobacco: Never Used  Substance Use Topics  . Alcohol use: No    Allergies  Allergen Reactions  . Tetanus Toxoids Hives    Current Outpatient Medications  Medication Sig Dispense Refill  . amLODipine (NORVASC) 5 MG tablet Take 1 tablet (5 mg total) by mouth daily. 90 tablet 1  . Multiple Vitamin (MULTI-VITAMIN PO) Take by mouth.     No current facility-administered medications for this visit.     Review of Systems  Constitutional: Negative for chills, fatigue, fever and unexpected weight change.  HENT: Negative for trouble swallowing.  Eyes: Negative for loss of vision.  Respiratory: Negative for cough, shortness of breath and wheezing.  Cardiovascular: Negative for chest pain, leg swelling, palpitations and syncope.  GI: Positive for abdominal pain. Negative for blood in stool, diarrhea, nausea and vomiting.  GU: Negative for difficulty urinating, dysuria, frequency and hematuria.  Musculoskeletal: Positive for back pain, leg pain and joint pain.  Skin: Negative for rash.  Neurological: Negative for dizziness, headaches, light-headedness, numbness and seizures.  Psychiatric: Negative for behavioral problem, confusion, depressed mood and sleep disturbance.        Objective:  Objective   Vitals:   09/28/18 0925  BP: (!) 150/100  Pulse: 88  Weight: 235 lb (106.6 kg)  Height: 5\' 6"  (1.676 m)   Body mass index is 37.93 kg/m.  Physical Exam Vitals signs and nursing note reviewed. Exam conducted with a chaperone present.  Constitutional:      Appearance: She is well-developed.  HENT:     Head: Normocephalic and atraumatic.  Eyes:     Pupils: Pupils are  equal, round, and reactive to light.  Cardiovascular:     Rate and Rhythm: Normal rate and regular rhythm.  Pulmonary:     Effort: Pulmonary effort is normal. No respiratory distress.  Abdominal:     General: There is distension.     Tenderness: There is no abdominal tenderness. There is no guarding or rebound.     Hernia: A hernia is present.  Genitourinary:    General: Normal vulva.     Comments: Exam limited by habitus and patient discomfort.  Normal appearing cervix, no polyps.  Normal appearing vagina.  Enlarged uterus with bilateral adnexal masses. Pelvis is not fixed. No evidence of lymphadenopathy.  Skin:    General: Skin is warm and dry.  Neurological:     Mental Status: She is alert and oriented to person, place, and time.  Psychiatric:        Behavior: Behavior normal.        Thought Content: Thought content normal.        Judgment: Judgment normal.    Endometrial Biopsy After discussion with the patient regarding her abnormal uterine bleeding I recommended that she proceed with an endometrial biopsy for further diagnosis. The risks, benefits, alternatives, and indications for an endometrial biopsy were discussed with the patient in detail. She understood the risks including infection, bleeding, cervical laceration and uterine perforation.  Verbal consent was obtained.   Patient elected to have biopsy in office today rather than plan a D&C.  PROCEDURE NOTE:  Pipelle endometrial biopsy was performed using aseptic technique with iodine preparation.  The uterus was sounded to a length of 8 cm.  Adequate sampling was not obtained with minimal blood loss. Extreme patient discomfort with biopsy of endometrium. The patient tolerated the procedure well.  Disposition will be pending pathology.      Assessment/Plan:     68 yo with postmenopausal bleeding and bilateral ovarian masses. 1. Thickened Endometrium and postmenopausal bleeding-  Endometrial biopsy obtained today.  Sapling very limited secondary to scant tissue return and patient discomfort.  2. Will obtain ROMA for bilateral ovarian masses. High concern for malignancy.  3. Pap smear performed today.  4. Close follow up for 1 week planned.  Patient will likely need GYN ONC referral. Significant abdominal hernia will complicate surgery and require concurrent surgical planning with General Surgery.   More than 50 minutes were spent face to face with the patient in the room with more than 50% of the time spent providing counseling and discussing the plan of management.     Adrian Prows MD [facility] 10/01/2018 11:48 AM

## 2018-09-28 NOTE — Patient Instructions (Addendum)
Please try to stop smoking. Please try to maintain a healthy weight. Please try to lose 30 pounds and reach a BMI of 30.  Please call our office if you would like [place] to send the referral to another surgeon for the second opinion.    Ventral Hernia  A ventral hernia is a bulge of tissue from inside the abdomen that pushes through a weak area of the muscles that form the front wall of the abdomen. The tissues inside the abdomen are inside a sac (peritoneum). These tissues include the small intestine, large intestine, and the fatty tissue that covers the intestines (omentum). Sometimes, the bulge that forms a hernia contains intestines. Other hernias contain only fat. Ventral hernias do not go away without surgical treatment. There are several types of ventral hernias. You may have:  A hernia at an incision site from previous abdominal surgery (incisional hernia).  A hernia just above the belly button (epigastric hernia), or at the belly button (umbilical hernia). These types of hernias can develop from heavy lifting or straining.  A hernia that comes and goes (reducible hernia). It may be visible only when you lift or strain. This type of hernia can be pushed back into the abdomen (reduced).  A hernia that traps abdominal tissue inside the hernia (incarcerated hernia). This type of hernia does not reduce.  A hernia that cuts off blood flow to the tissues inside the hernia (strangulated hernia). The tissues can start to die if this happens. This is a very painful bulge that cannot be reduced. A strangulated hernia is a medical emergency. What are the causes? This condition is caused by abdominal tissue putting pressure on an area of weakness in the abdominal muscles. What increases the risk? The following factors may make you more likely to develop this condition:  Being female.  Being 4 or older.  Being overweight or obese.  Having had previous abdominal surgery, especially if there was an  infection after surgery.  Having had an injury to the abdominal wall.  Having had several pregnancies.  Having a buildup of fluid inside the abdomen (ascites). What are the signs or symptoms? The only symptom of a ventral hernia may be a painless bulge in the abdomen. A reducible hernia may be visible only when you strain, cough, or lift. Other symptoms may include:  Dull pain.  A feeling of pressure. Signs and symptoms of a strangulated hernia may include:  Increasing pain.  Nausea and vomiting.  Pain when pressing on the hernia.  The skin over the hernia turning red or purple.  Constipation.  Blood in the stool (feces). How is this diagnosed? This condition may be diagnosed based on:  Your symptoms.  Your medical history.  A physical exam. You may be asked to cough or strain while standing. These actions increase the pressure inside your abdomen and force the hernia through the opening in your muscles. Your health care provider may try to reduce the hernia by pressing on it.  Imaging studies, such as an ultrasound or CT scan. How is this treated? This condition is treated with surgery. If you have a strangulated hernia, surgery is done as soon as possible. If your hernia is small and not incarcerated, you may be asked to lose some weight before surgery. Follow these instructions at home:  Follow instructions from your health care provider about eating or drinking restrictions.  If you are overweight, your health care provider may recommend that you increase your activity level  and eat a healthier diet.  Do not lift anything that is heavier than 10 lb (4.5 kg).  Return to your normal activities as told by your health care provider. Ask your health care provider what activities are safe for you. You may need to avoid activities that increase pressure on your hernia.  Take over-the-counter and prescription medicines only as told by your health care provider.  Keep all  follow-up visits as told by your health care provider. This is important. Contact a health care provider if:  Your hernia gets larger.  Your hernia becomes painful. Get help right away if:  Your hernia becomes increasingly painful.  You have pain along with any of the following: ? Changes in skin color in the area of the hernia. ? Nausea. ? Vomiting. ? Fever. Summary  A ventral hernia is a bulge of tissue from inside the abdomen that pushes through a weak area of the muscles that form the front wall of the abdomen.  This condition is treated with surgery, which may be urgent depending on your hernia.  Do not lift anything that is heavier than 10 lb (4.5 kg), and follow activity instructions from your health care provider. This information is not intended to replace advice given to you by your health care provider. Make sure you discuss any questions you have with your health care provider. Document Released: 03/14/2012 Document Revised: 05/10/2017 Document Reviewed: 10/17/2016 Elsevier Interactive Patient Education  2019 [employer].

## 2018-09-30 LAB — OVARIAN MALIGNANCY RISK-ROMA
Cancer Antigen (CA) 125: 269 U/mL — ABNORMAL HIGH (ref 0.0–38.1)
HE4: 136 pmol/L — ABNORMAL HIGH (ref 0.0–96.5)
Postmenopausal ROMA: 7.53 — ABNORMAL HIGH
Premenopausal ROMA: 5.11 — ABNORMAL HIGH

## 2018-09-30 LAB — PREMENOPAUSAL INTERP: HIGH

## 2018-09-30 LAB — POSTMENOPAUSAL INTERP: HIGH

## 2018-10-01 ENCOUNTER — Encounter: Payer: Self-pay | Admitting: Obstetrics and Gynecology

## 2018-10-02 LAB — CYTOLOGY - PAP
Diagnosis: NEGATIVE
HPV: NOT DETECTED

## 2018-10-02 LAB — NOVEL CORONAVIRUS, NAA: SARS-CoV-2, NAA: NOT DETECTED

## 2018-10-04 NOTE — Progress Notes (Signed)
Called, no answer, left message requesting her to return call. Will need gyn onc referral.

## 2018-10-05 ENCOUNTER — Other Ambulatory Visit: Payer: Self-pay | Admitting: Obstetrics and Gynecology

## 2018-10-05 ENCOUNTER — Telehealth: Payer: Self-pay | Admitting: Obstetrics and Gynecology

## 2018-10-05 DIAGNOSIS — N83201 Unspecified ovarian cyst, right side: Secondary | ICD-10-CM

## 2018-10-05 DIAGNOSIS — N83299 Other ovarian cyst, unspecified side: Secondary | ICD-10-CM

## 2018-10-05 DIAGNOSIS — R9389 Abnormal findings on diagnostic imaging of other specified body structures: Secondary | ICD-10-CM

## 2018-10-05 DIAGNOSIS — N83202 Unspecified ovarian cyst, left side: Secondary | ICD-10-CM

## 2018-10-05 NOTE — Telephone Encounter (Signed)
Patient states she is returning Dr. Gennette Pac call from yesterday.  Aware CS not in office today.  Patient unable to answer the phone after 2:00 on weekdays due to work if call back could be before that time.

## 2018-10-05 NOTE — Telephone Encounter (Signed)
Called and spoke with patient.

## 2018-10-05 NOTE — Progress Notes (Signed)
Called and discussed with patient. Will refer to gyn onc

## 2018-10-17 ENCOUNTER — Other Ambulatory Visit: Payer: Self-pay

## 2018-10-17 ENCOUNTER — Encounter: Payer: Self-pay | Admitting: Obstetrics and Gynecology

## 2018-10-17 ENCOUNTER — Inpatient Hospital Stay: Payer: Commercial Managed Care - PPO | Attending: Obstetrics and Gynecology | Admitting: Obstetrics and Gynecology

## 2018-10-17 DIAGNOSIS — E669 Obesity, unspecified: Secondary | ICD-10-CM

## 2018-10-17 DIAGNOSIS — N950 Postmenopausal bleeding: Secondary | ICD-10-CM | POA: Insufficient documentation

## 2018-10-17 DIAGNOSIS — K439 Ventral hernia without obstruction or gangrene: Secondary | ICD-10-CM | POA: Insufficient documentation

## 2018-10-17 DIAGNOSIS — Z6838 Body mass index (BMI) 38.0-38.9, adult: Secondary | ICD-10-CM | POA: Diagnosis not present

## 2018-10-17 DIAGNOSIS — R9389 Abnormal findings on diagnostic imaging of other specified body structures: Secondary | ICD-10-CM | POA: Insufficient documentation

## 2018-10-17 DIAGNOSIS — N83209 Unspecified ovarian cyst, unspecified side: Secondary | ICD-10-CM | POA: Insufficient documentation

## 2018-10-17 DIAGNOSIS — R971 Elevated cancer antigen 125 [CA 125]: Secondary | ICD-10-CM | POA: Diagnosis not present

## 2018-10-17 DIAGNOSIS — F17210 Nicotine dependence, cigarettes, uncomplicated: Secondary | ICD-10-CM

## 2018-10-17 DIAGNOSIS — I10 Essential (primary) hypertension: Secondary | ICD-10-CM | POA: Diagnosis not present

## 2018-10-17 NOTE — Progress Notes (Signed)
Gynecologic Oncology Consult Visit   Referring Provider: Dr Gilman Schmidt  Chief Concern: ovarian cysts and thickened endometrium  Subjective:  Robin Adams is a 68 y.o. female who is seen in consultation from Dr. Gilman Schmidt for postmenopausal bleeding, thickened endometrium, ovarian cystic masses and elevated ROMA.   She has been having brown discharge that she noticed on her pad that she wears for urinary incontinence. She realized that it was vaginal bleeding.  She was seen by her PCP in June who ordered her an [place] and a CT.  This has been her first episode of vaginal bleeding since menopause. No more bleeding or spotting recently.  She has longstanding large abdominal hernia and had seen Dr Zara Council with plan to repair in the fall after she stopped smoking and lost 30 pounds.    She works as a Quarry manager, but is overall very skeptical of healthcare. Believes that she has remained in good health for 65 years because she has avoiding going to the doctor's office. She is proud that she does not take medications that have side effects.    OB History H x of 1 vaginal delivery and 2 cesarean deliveries. Once complicated by incisional hernia.   GYN History Menarche: [age] Menopause: 23 Hx of regular monthly periods. Lasting 3-6 days.  History of dysmenorrhea Not currently sexually acitve.  Denies history of fibroids or ovarian cysts Remote hisotry of a STD, uncertain of what type.  Last pap smear more than 10 years ago.   Saw Dr Gilman Schmidt in Gyn  6/19 CA125 269 and HE4 136 ROMA 7.53 PAP/HPV negative  09/28/18 Endometrial biopsy was negative, benign endocervical tissue, no endometrial tissue.  Uterus sounded to 8 cm.  09/14/18 US FINDINGS: Uterus: Measurements: 9.7 x 3.2 x 4.8 cm = volume: 76.2 mL. Heterogeneous myometrial echogenicity. No discrete mass. Endometrium: Thickness: 13 mm thick. Abnormal thickened and heterogeneous appearance. No endometrial fluid. Right ovary Measurements: 4.9 x 3.7 x  4.8 cm = volume: 45.5 mL. Seen only on transabdominal imaging. Complicated cyst within RIGHT ovary suspect containing a septation.  Left ovary Measurements: 7.8 x 4.4 x 5.2 cm = volume: 93.2 mL. Cyst identified within LEFT ovary, 5.1 x 3.8 x 4.5 cm. Internal blood flow present on color Doppler imaging. Other findings No free pelvic fluid.  No other adnexal masses.  IMPRESSION: Abnormal thickened and heterogeneous endometrial complex 13 mm thick.  In the setting of post-menopausal bleeding, endometrial sampling is indicated to exclude carcinoma. If results are benign, sonohysterogram should be considered for focal lesion work-up. (Ref: Radiological Reasoning: Algorithmic Workup of Abnormal Vaginal Bleeding with Endovaginal Sonography and Sonohysterography. AJR 2008; 470:J62-83)  Cystic lesions of both ovaries, question complicated, incompletely characterized; further characterization of these lesions by MR imaging recommended.  CT 09/17/17 IMPRESSION: 1. Large ventral wall hernia as detailed above containing both loops of colon and small bowel without evidence of an obstruction or strangulation/incarceration. 2. Bilateral ovarian masses. These are not well characterized on this exam. Outpatient follow-up is recommended. These were not well characterized on the recent pelvic ultrasound. Consider outpatient gynecology follow-up plus a contrast enhanced MRI. 3. Hepatic steatosis. 4. Indeterminate 0.9 cm hyperdense nodule in the upper pole of the left kidney. This is favored to represent a proteinaceous or hemorrhagic cyst, however it is not well characterized on this exam. Follow-up with a nonemergent renal ultrasound is recommended for further evaluation of this finding.  Problem List: Patient Active Problem List   Diagnosis Date Noted  . Ovarian cyst 10/17/2018  .  Thickened endometrium 10/17/2018    Past Medical History: Past Medical History:  Diagnosis Date  . Hernia, hiatal   .  Hypertension     Past Surgical History: Past Surgical History:  Procedure Laterality Date  . CESAREAN SECTION    . FRACTURE SURGERY Right   . TUBAL LIGATION      OB History:  OB History  Gravida Para Term Preterm AB Living  3 3 3     3   SAB TAB Ectopic Multiple Live Births          3    # Outcome Date GA Lbr Len/2nd Weight Sex Delivery Anes PTL Lv  3 Term 11/04/78    F CS-LTranv   LIV  2 Term 11/15/74    F CS-LTranv   LIV  1 Term 10/03/71    F Vag-Spont   LIV    Family History: Family History  Problem Relation Age of Onset  . Kyphosis Mother   . Alcohol abuse Father   . Liver disease Father   . Kidney failure Father   . Thyroid disease Maternal Grandmother   . Breast cancer Paternal Grandmother   . Heart disease Paternal Grandfather   . Rheum arthritis Daughter     Social History: Social History   Socioeconomic History  . Marital status: Legally Separated    Spouse name: Not on file  . Number of children: Not on file  . Years of education: Not on file  . Highest education level: Not on file  Occupational History  . Not on file  Social Needs  . Financial resource strain: Not on file  . Food insecurity    Worry: Not on file    Inability: Not on file  . Transportation needs    Medical: Not on file    Non-medical: Not on file  Tobacco Use  . Smoking status: Current Every Day Smoker    Packs/day: 1.00    Years: 50.00    Pack years: 50.00    Types: Cigarettes  . Smokeless tobacco: Never Used  Substance and Sexual Activity  . Alcohol use: No  . Drug use: No  . Sexual activity: Not Currently    Partners: Male    Birth control/protection: Post-menopausal  Lifestyle  . Physical activity    Days per week: Not on file    Minutes per session: Not on file  . Stress: Not on file  Relationships  . Social Herbalist on phone: Not on file    Gets together: Not on file    Attends religious service: Not on file    Active member of club or  organization: Not on file    Attends meetings of clubs or organizations: Not on file    Relationship status: Not on file  . Intimate partner violence    Fear of current or ex partner: Not on file    Emotionally abused: Not on file    Physically abused: Not on file    Forced sexual activity: Not on file  Other Topics Concern  . Not on file  Social History Narrative  . Not on file    Allergies: Allergies  Allergen Reactions  . Tetanus Toxoids Hives    Current Medications: Current Outpatient Medications  Medication Sig Dispense Refill  . amLODipine (NORVASC) 5 MG tablet Take 1 tablet (5 mg total) by mouth daily. 90 tablet 1  . Multiple Vitamin (MULTI-VITAMIN PO) Take by mouth.     No current  facility-administered medications for this visit.     Review of Systems General: negative for, fevers, chills, fatigue, changes in sleep, changes in weight or appetite Skin: negative for changes in color, texture, moles or lesions Eyes: negative for, changes in vision, pain, diplopia HEENT: negative for, change in hearing, pain, discharge, tinnitus, vertigo, voice changes, sore throat, neck masses.  Some ringing in her ears and nasal congestion. Negative COVID test 09/28/18.  Breasts: negative for breast lumps Pulmonary: negative for, dyspnea, orthopnea, productive cough Cardiac: negative for, palpitations, syncope, pain, discomfort, pressure Gastrointestinal: negative for, dysphagia, nausea, vomiting, jaundice, pain, constipation, diarrhea, hematemesis, hematochezia Genitourinary/Sexual: negative for, dysuria, discharge, hesitancy, nocturia, retention, stones, infections, STD's, incontinence Musculoskeletal: negative for, pain, stiffness, swelling, range of motion limitation Hematology: negative for, easy bruising, bleeding Neurologic/Psych: negative for, headaches, seizures, paralysis, weakness, tremor, change in gait, change in sensation, mood swings, depression, anxiety, change in  memory  Objective:  Physical Examination:  BP (!) 174/93 (BP Location: Left Arm, Patient Position: Sitting)   Pulse 90   Temp 98.6 F (37 C) (Tympanic)   Ht 5\' 6"  (1.676 m)   Wt 233 lb 6.4 oz (105.9 kg)   BMI 37.67 kg/m    ECOG Performance Status: 1 - Symptomatic but completely ambulatory  General appearance: alert, cooperative and appears stated age HEENT:PERRLA, neck supple with midline trachea, thyroid without masses and trachea midline Lymph node survey: non-palpable, axillary, inguinal, supraclavicular Cardiovascular: regular rate and rhythm, no murmurs or gallops Respiratory: normal air entry, lungs clear to auscultation and no rales, rhonchi or wheezing Breast exam: breasts appear normal, no suspicious masses, no skin or nipple changes or axillary nodes, not examined. Abdomen: soft, non-tender, without masses or organomegaly, protuberant, normal bowel sounds, no masses palpated and well healed incision. There is a large protruding ventral hernia in the upper abdomen with loops of bowel. Back: inspection of back is normal Extremities: extremities normal, atraumatic, no cyanosis or edema Skin exam - normal coloration and turgor, no rashes, no suspicious skin lesions noted. Neurological exam reveals alert, oriented, normal speech, no focal findings or movement disorder noted.  Pelvic: exam chaperoned by nurse;  Vulva: normal appearing vulva with no masses, tenderness or lesions; Vagina: normal vagina; Adnexa: normal adnexa in size, nontender and no masses; Uterus: uterus is normal size, shape, consistency and nontender; Cervix: anteverted; Rectal: not indicated and normal rectal, no masses    Assessment:  Robin Adams is a 68 y.o. female diagnosed withpostmenopausal bleeding, thickened endometrium, ovarian cystic masses left (8 cm)> right and elevated ROMA. Elevated ROMA with CA125 over 250 concerning for gyn cancer.  Thickened endometrium and spotting also concerning for EIN or  cancer at her age.  Biopsy was negative, but no endometrial tissue in specimen.  She also has a very large ventral hernia and has seen General Surgery with plan for repair later this year.    Mild sinusitis, negative COVID test 6/20  Medical co-morbidities complicating care: obesity (BMI 38), smokes 1 PPD, HTN Plan:   Problem List Items Addressed This Visit      Endocrine   Ovarian cyst     Other   Thickened endometrium     We discussed options for management including surgery.  She has cystic ovarian masses with no evidence of metastatic disease on imaging, but CA125 and HE4/ROMA siginificantly elevated.  Strongly recommend surgery to remove the adnexa and do staging if cancer found in view of significant risk of cancer.  In addition, she has thickened endometrium  and spotting with negative endometrial biopsy, so hysterectomy would also be appropriate and possible staging if endometrial cancer found.  This surgery would be done via laparotomy in view of large hernia.    Discussed the complexity of the hernia repair procedure.  Although there was a plan for this to be done by Dr Hampton Abbot after she stopped smoking and lost 30 pounds, do not think we should wait in view of concern for possible uterine or ovarian cancer.  Told the patient and her daughter that I would recommend she have the surgery done at [facility] by [place] and [facility] Surgery.  Will have Gyn Onc preop appt. 7/17 and surgery scheduled for 7/21.  We will have her seen in advance by Dr Chrisandra Carota or [facility] in advance and have them assist with the hernia repair.   The patient's diagnosis, an outline of the further diagnostic and laboratory studies which will be required, the recommendation, and alternatives were discussed.  All questions were answered to the patient's satisfaction.  A total of 60 minutes were spent with the patient/family today; 50% was spent in education, counseling and coordination of care for pelvic masses and  elevated CA125 with PMB and thickened endometrium.    Mellody Drown, MD  CC:  [address], [address],  [place] [id number] [phone]

## 2019-01-04 ENCOUNTER — Ambulatory Visit: Payer: Self-pay | Admitting: Surgery

## 2019-05-29 ENCOUNTER — Inpatient Hospital Stay: Payer: Self-pay

## 2019-08-03 ENCOUNTER — Ambulatory Visit: Payer: Self-pay | Attending: Internal Medicine

## 2019-08-03 DIAGNOSIS — Z23 Encounter for immunization: Secondary | ICD-10-CM

## 2019-08-03 NOTE — Progress Notes (Signed)
   Covid-19 Vaccination [facility]  Name:  Robin Adams    MRN: [id number] DOB: November 07, 1950  08/03/2019  Ms. Niemeyer was observed post Covid-19 immunization for 15 minutes without incident. She was provided with Vaccine Information Sheet and instruction to access the V-Safe system.   Ms. Vallez was instructed to call 911 with any severe reactions post vaccine: Marland Kitchen Difficulty breathing  . Swelling of face and throat  . A fast heartbeat  . A bad rash all over body  . Dizziness and weakness   Immunizations Administered    Name Date Dose VIS Date Route   Pfizer COVID-19 Vaccine 08/03/2019 12:49 PM 0.3 mL 06/05/2018 Intramuscular   Manufacturer: [place]   Lot: [id number]   [place]: [id number]

## 2019-08-27 ENCOUNTER — Ambulatory Visit: Payer: Self-pay | Attending: Internal Medicine

## 2019-08-27 DIAGNOSIS — Z23 Encounter for immunization: Secondary | ICD-10-CM

## 2019-08-27 NOTE — Progress Notes (Signed)
   Covid-19 Vaccination Clinic  Name:  Robin Adams    MRN: [id number] DOB: 1951/03/07  08/27/2019  [name] was observed post Covid-19 immunization for 30 minutes based on pre-vaccination screening without incident. She was provided with Vaccine Information Sheet and instruction to access the V-Safe system.   [name] was instructed to call 911 with any severe reactions post vaccine: Marland Kitchen Difficulty breathing  . Swelling of face and throat  . A fast heartbeat  . A bad rash all over body  . Dizziness and weakness   Immunizations Administered    Name Date Dose VIS Date Route   Pfizer COVID-19 Vaccine 08/27/2019  8:49 AM 0.3 mL 06/05/2018 Intramuscular   Manufacturer: [place]   Lot: [id number]   [place]: [id number]

## 2020-01-01 ENCOUNTER — Other Ambulatory Visit: Payer: Medicare Other

## 2020-01-01 DIAGNOSIS — Z20822 Contact with and (suspected) exposure to covid-19: Secondary | ICD-10-CM

## 2020-01-02 LAB — SARS-COV-2, NAA 2 DAY TAT

## 2020-01-02 LAB — NOVEL CORONAVIRUS, NAA: SARS-CoV-2, NAA: NOT DETECTED

## 2021-02-17 ENCOUNTER — Encounter (HOSPITAL_COMMUNITY): Payer: Self-pay | Admitting: Emergency Medicine

## 2021-02-17 ENCOUNTER — Emergency Department (HOSPITAL_COMMUNITY): Payer: Medicare Other

## 2021-02-17 ENCOUNTER — Inpatient Hospital Stay (HOSPITAL_COMMUNITY)
Admission: EM | Admit: 2021-02-17 | Discharge: 2021-02-24 | DRG: 101 | Disposition: A | Discharge status: Home / Self Care | Payer: Medicare Other | Attending: Internal Medicine | Admitting: Internal Medicine

## 2021-02-17 ENCOUNTER — Inpatient Hospital Stay (HOSPITAL_COMMUNITY): Payer: Medicare Other

## 2021-02-17 ENCOUNTER — Other Ambulatory Visit: Payer: Self-pay

## 2021-02-17 DIAGNOSIS — R739 Hyperglycemia, unspecified: Secondary | ICD-10-CM

## 2021-02-17 DIAGNOSIS — R9431 Abnormal electrocardiogram [ECG] [EKG]: Secondary | ICD-10-CM | POA: Diagnosis not present

## 2021-02-17 DIAGNOSIS — F17210 Nicotine dependence, cigarettes, uncomplicated: Secondary | ICD-10-CM

## 2021-02-17 DIAGNOSIS — Z6841 Body Mass Index (BMI) 40.0 and over, adult: Secondary | ICD-10-CM

## 2021-02-17 DIAGNOSIS — E079 Disorder of thyroid, unspecified: Secondary | ICD-10-CM

## 2021-02-17 DIAGNOSIS — E876 Hypokalemia: Secondary | ICD-10-CM | POA: Diagnosis not present

## 2021-02-17 DIAGNOSIS — G9341 Metabolic encephalopathy: Secondary | ICD-10-CM | POA: Diagnosis not present

## 2021-02-17 DIAGNOSIS — R4182 Altered mental status, unspecified: Secondary | ICD-10-CM | POA: Diagnosis not present

## 2021-02-17 DIAGNOSIS — G0490 Encephalitis and encephalomyelitis, unspecified: Secondary | ICD-10-CM | POA: Diagnosis not present

## 2021-02-17 DIAGNOSIS — Z880 Allergy status to penicillin: Secondary | ICD-10-CM | POA: Diagnosis not present

## 2021-02-17 DIAGNOSIS — G40901 Epilepsy, unspecified, not intractable, with status epilepticus: Principal | ICD-10-CM

## 2021-02-17 DIAGNOSIS — Z90722 Acquired absence of ovaries, bilateral: Secondary | ICD-10-CM

## 2021-02-17 DIAGNOSIS — R569 Unspecified convulsions: Secondary | ICD-10-CM | POA: Diagnosis not present

## 2021-02-17 DIAGNOSIS — E041 Nontoxic single thyroid nodule: Secondary | ICD-10-CM | POA: Diagnosis present

## 2021-02-17 DIAGNOSIS — Z8249 Family history of ischemic heart disease and other diseases of the circulatory system: Secondary | ICD-10-CM

## 2021-02-17 DIAGNOSIS — Z811 Family history of alcohol abuse and dependence: Secondary | ICD-10-CM | POA: Diagnosis not present

## 2021-02-17 DIAGNOSIS — R300 Dysuria: Secondary | ICD-10-CM | POA: Diagnosis present

## 2021-02-17 DIAGNOSIS — Z841 Family history of disorders of kidney and ureter: Secondary | ICD-10-CM | POA: Diagnosis not present

## 2021-02-17 DIAGNOSIS — G912 (Idiopathic) normal pressure hydrocephalus: Secondary | ICD-10-CM

## 2021-02-17 DIAGNOSIS — Z803 Family history of malignant neoplasm of breast: Secondary | ICD-10-CM

## 2021-02-17 DIAGNOSIS — I10 Essential (primary) hypertension: Secondary | ICD-10-CM

## 2021-02-17 DIAGNOSIS — B004 Herpesviral encephalitis: Secondary | ICD-10-CM

## 2021-02-17 DIAGNOSIS — H532 Diplopia: Secondary | ICD-10-CM | POA: Diagnosis present

## 2021-02-17 DIAGNOSIS — Z720 Tobacco use: Secondary | ICD-10-CM | POA: Diagnosis not present

## 2021-02-17 DIAGNOSIS — I517 Cardiomegaly: Secondary | ICD-10-CM

## 2021-02-17 DIAGNOSIS — Z90710 Acquired absence of both cervix and uterus: Secondary | ICD-10-CM

## 2021-02-17 DIAGNOSIS — Z20822 Contact with and (suspected) exposure to covid-19: Secondary | ICD-10-CM | POA: Diagnosis present

## 2021-02-17 DIAGNOSIS — Z79899 Other long term (current) drug therapy: Secondary | ICD-10-CM | POA: Diagnosis not present

## 2021-02-17 DIAGNOSIS — E66813 Obesity, class 3: Secondary | ICD-10-CM | POA: Diagnosis present

## 2021-02-17 LAB — RESP PANEL BY RT-PCR (FLU A&B, COVID) ARPGX2
Influenza A by PCR: NEGATIVE
Influenza B by PCR: NEGATIVE
SARS Coronavirus 2 by RT PCR: NEGATIVE

## 2021-02-17 LAB — COMPREHENSIVE METABOLIC PANEL
ALT: 17 U/L (ref 0–44)
AST: 19 U/L (ref 15–41)
Albumin: 3.3 g/dL — ABNORMAL LOW (ref 3.5–5.0)
Alkaline Phosphatase: 100 U/L (ref 38–126)
Anion gap: 11 (ref 5–15)
BUN: 15 mg/dL (ref 8–23)
CO2: 23 mmol/L (ref 22–32)
Calcium: 8.7 mg/dL — ABNORMAL LOW (ref 8.9–10.3)
Chloride: 105 mmol/L (ref 98–111)
Creatinine, Ser: 0.96 mg/dL (ref 0.44–1.00)
GFR, Estimated: >60 mL/min (ref >60)
Glucose, Bld: 201 mg/dL — ABNORMAL HIGH (ref 70–99)
Potassium: 3.8 mmol/L (ref 3.5–5.1)
Sodium: 139 mmol/L (ref 135–145)
Total Bilirubin: 0.3 mg/dL (ref 0.3–1.2)
Total Protein: 6.6 g/dL (ref 6.5–8.1)

## 2021-02-17 LAB — DIFFERENTIAL
Abs Immature Granulocytes: 0.25 10*3/uL — ABNORMAL HIGH (ref 0.00–0.07)
Basophils Absolute: 0.1 10*3/uL (ref 0.0–0.1)
Basophils Relative: 1 %
Eosinophils Absolute: 0.2 10*3/uL (ref 0.0–0.5)
Eosinophils Relative: 2 %
Immature Granulocytes: 2 %
Lymphocytes Relative: 15 %
Lymphs Abs: 2.1 10*3/uL (ref 0.7–4.0)
Monocytes Absolute: 0.6 10*3/uL (ref 0.1–1.0)
Monocytes Relative: 5 %
Neutro Abs: 10.1 10*3/uL — ABNORMAL HIGH (ref 1.7–7.7)
Neutrophils Relative %: 75 %

## 2021-02-17 LAB — I-STAT VENOUS BLOOD GAS, ED
Acid-base deficit: 1 mmol/L (ref 0.0–2.0)
Bicarbonate: 26.4 mmol/L (ref 20.0–28.0)
Calcium, Ion: 1.12 mmol/L — ABNORMAL LOW (ref 1.15–1.40)
HCT: 45 % (ref 36.0–46.0)
Hemoglobin: 15.3 g/dL — ABNORMAL HIGH (ref 12.0–15.0)
O2 Saturation: 98 %
Potassium: 3.5 mmol/L (ref 3.5–5.1)
Sodium: 140 mmol/L (ref 135–145)
TCO2: 28 mmol/L (ref 22–32)
pCO2, Ven: 52.8 mmHg (ref 44.0–60.0)
pH, Ven: 7.307 (ref 7.250–7.430)
pO2, Ven: 121 mmHg — ABNORMAL HIGH (ref 32.0–45.0)

## 2021-02-17 LAB — I-STAT CHEM 8, ED
BUN: 16 mg/dL (ref 8–23)
Calcium, Ion: 1.13 mmol/L — ABNORMAL LOW (ref 1.15–1.40)
Chloride: 105 mmol/L (ref 98–111)
Creatinine, Ser: 0.9 mg/dL (ref 0.44–1.00)
Glucose, Bld: 194 mg/dL — ABNORMAL HIGH (ref 70–99)
HCT: 46 % (ref 36.0–46.0)
Hemoglobin: 15.6 g/dL — ABNORMAL HIGH (ref 12.0–15.0)
Potassium: 3.6 mmol/L (ref 3.5–5.1)
Sodium: 141 mmol/L (ref 135–145)
TCO2: 25 mmol/L (ref 22–32)

## 2021-02-17 LAB — CBC
HCT: 46.1 % — ABNORMAL HIGH (ref 36.0–46.0)
Hemoglobin: 14.9 g/dL (ref 12.0–15.0)
MCH: 28.3 pg (ref 26.0–34.0)
MCHC: 32.3 g/dL (ref 30.0–36.0)
MCV: 87.6 fL (ref 80.0–100.0)
Platelets: 185 10*3/uL (ref 150–400)
RBC: 5.26 MIL/uL — ABNORMAL HIGH (ref 3.87–5.11)
RDW: 14.2 % (ref 11.5–15.5)
WBC: 13.4 10*3/uL — ABNORMAL HIGH (ref 4.0–10.5)
nRBC: 0 % (ref 0.0–0.2)

## 2021-02-17 LAB — CBG MONITORING, ED: Glucose-Capillary: 195 mg/dL — ABNORMAL HIGH (ref 70–99)

## 2021-02-17 LAB — PROTIME-INR
INR: 1.1 (ref 0.8–1.2)
Prothrombin Time: 14.1 seconds (ref 11.4–15.2)

## 2021-02-17 LAB — APTT: aPTT: 30 seconds (ref 24–36)

## 2021-02-17 IMAGING — DX DG CHEST 1V PORT
1 series · 1 of 1 positions shown · non-contrast
Comparison: Portable exam [K0] hours compared to [DATE]

CLINICAL DATA: Shortness of breath

EXAM:
PORTABLE CHEST 1 VIEW

[chest ap]
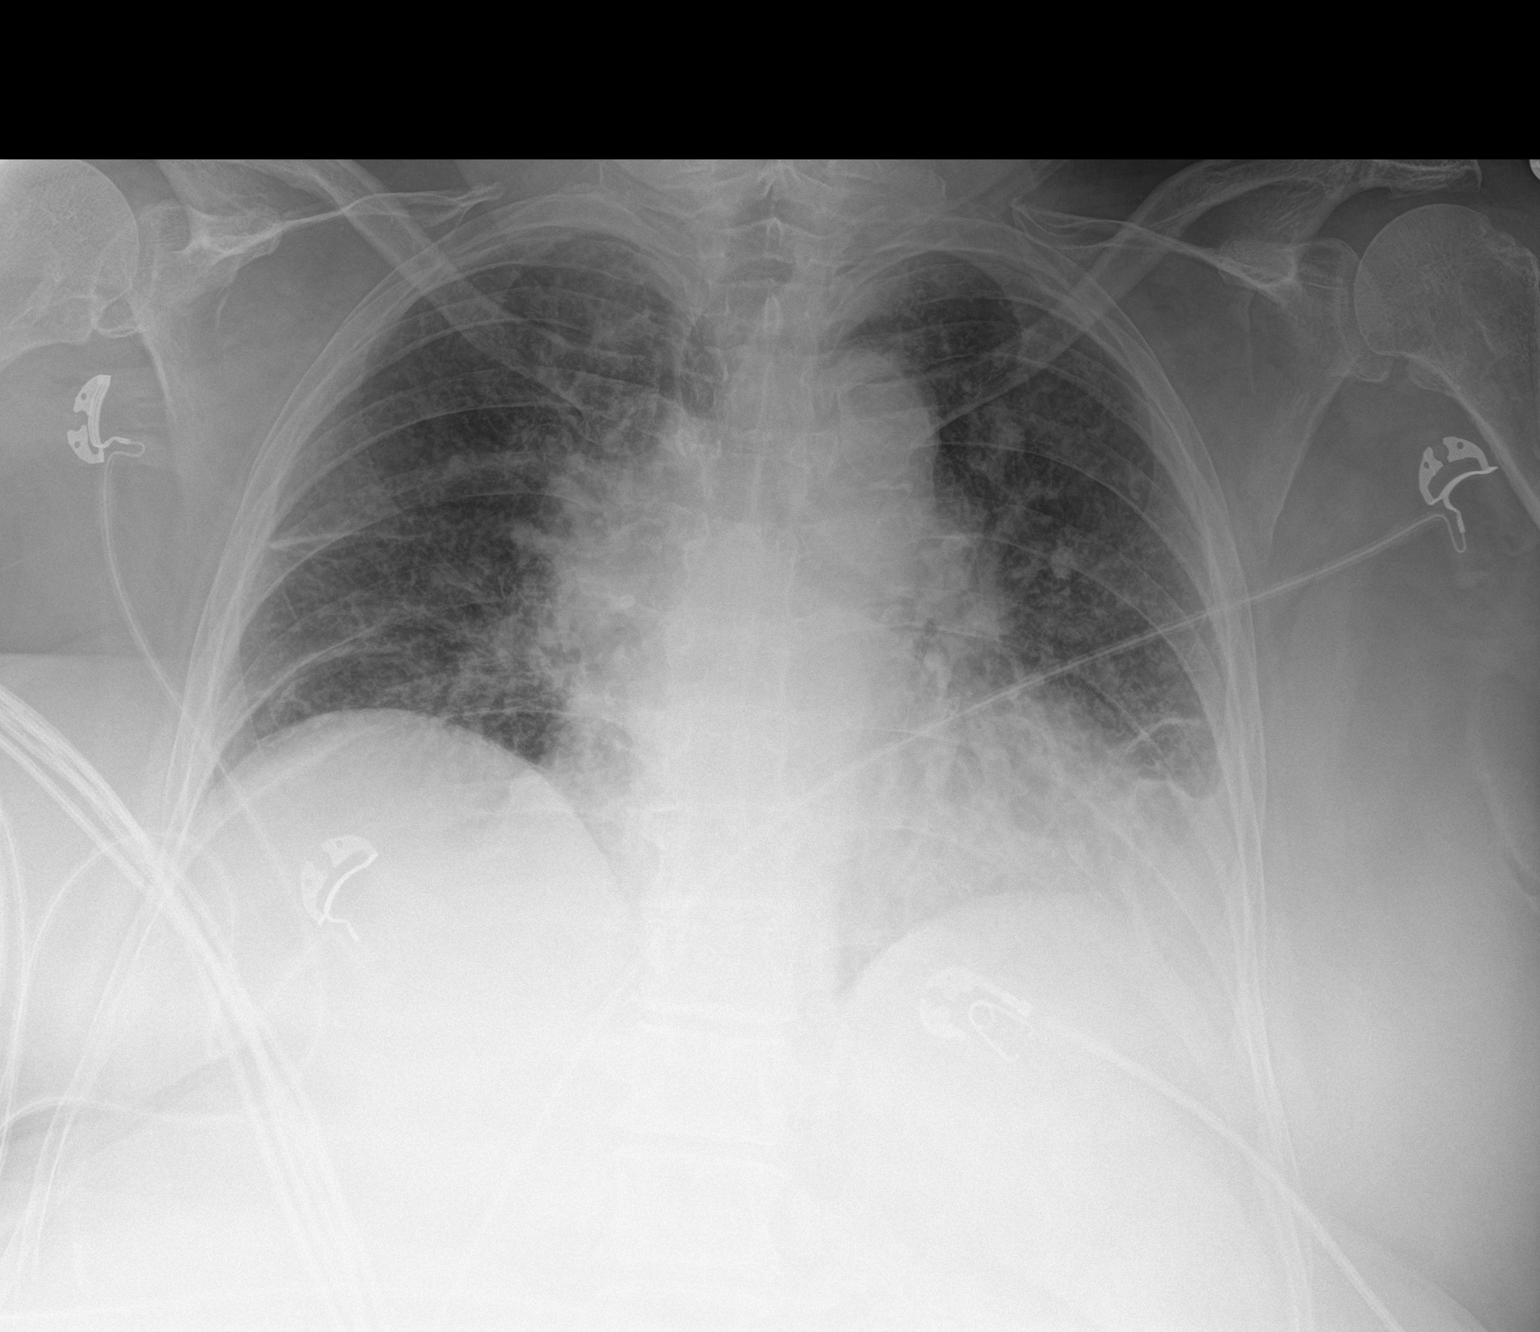

[1 of 1 positions shown; findings below may reference images not displayed]

FINDINGS: Enlargement of cardiac silhouette with pulmonary vascular
congestion.

Decreased lung volumes versus previous exam with mild bibasilar
atelectasis.

Scattered interstitial prominence, question mild pulmonary edema.

No pleural effusion or pneumothorax.

Bones demineralized.
IMPRESSION: Enlargement of cardiac silhouette with pulmonary vascular congestion
and interstitial prominence question pulmonary edema.

Bibasilar atelectasis.

## 2021-02-17 IMAGING — MR MR HEAD W/O CM
6 of 11 series · 25 of 48 positions shown · non-contrast
Comparison: No prior MRI, correlation made with CT head [DATE].

CLINICAL DATA: Neuro deficit, acute, stroke suspected

EXAM:
MRI HEAD WITHOUT CONTRAST
TECHNIQUE: Multiplanar, multiecho pulse sequences of the brain and surrounding
structures were obtained without intravenous contrast.

[Series 2: DWI · axial · 3.0mm · 0.94mm/px · z∈[-29,+131]mm · 7 of 109 slices shown (1 of 2)]
[im 1/109]
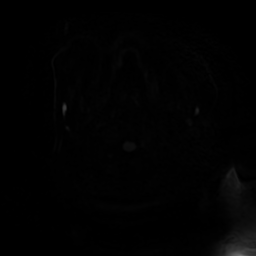
[im 19/109]
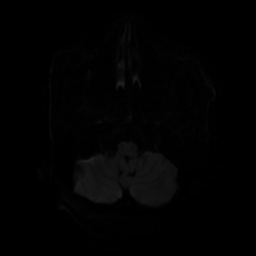
[im 37/109]
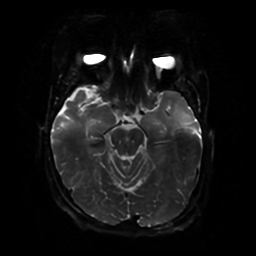
[im 55/109]
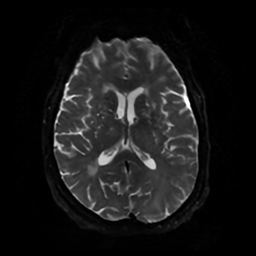
[im 73/109]
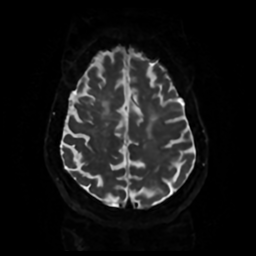
[im 91/109]
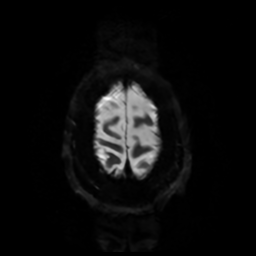
[im 109/109]
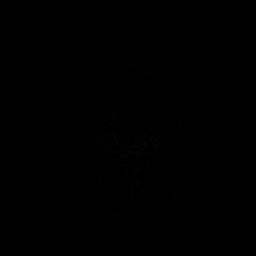

[Series 3: DWI · coronal · 4.0mm · 0.94mm/px · 6 of 80 slices shown (2 of 2)]
[im 1/80]
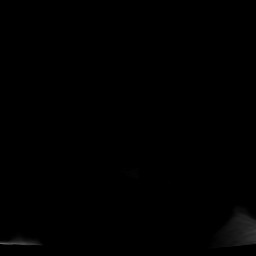
[im 16/80]
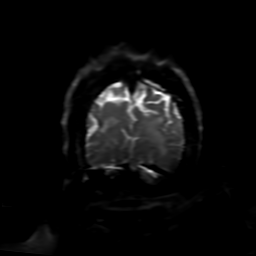
[im 32/80]
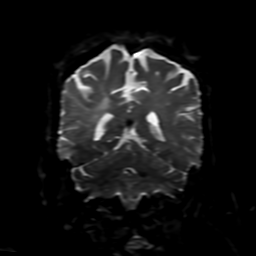
[im 48/80]
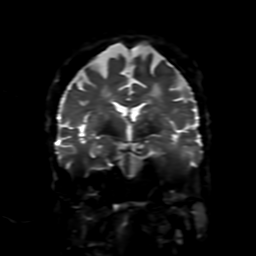
[im 64/80]
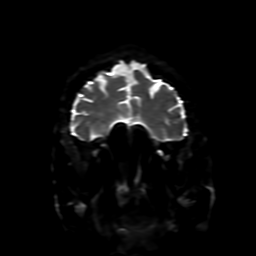
[im 80/80]
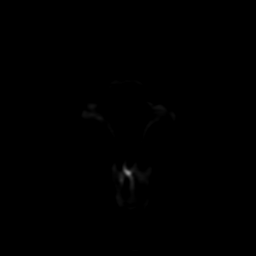

[Series 4: FLAIR · sagittal · 5.0mm · 0.23mm/px · 2 of 27 slices shown (1 of 2)]
[im 1/27]
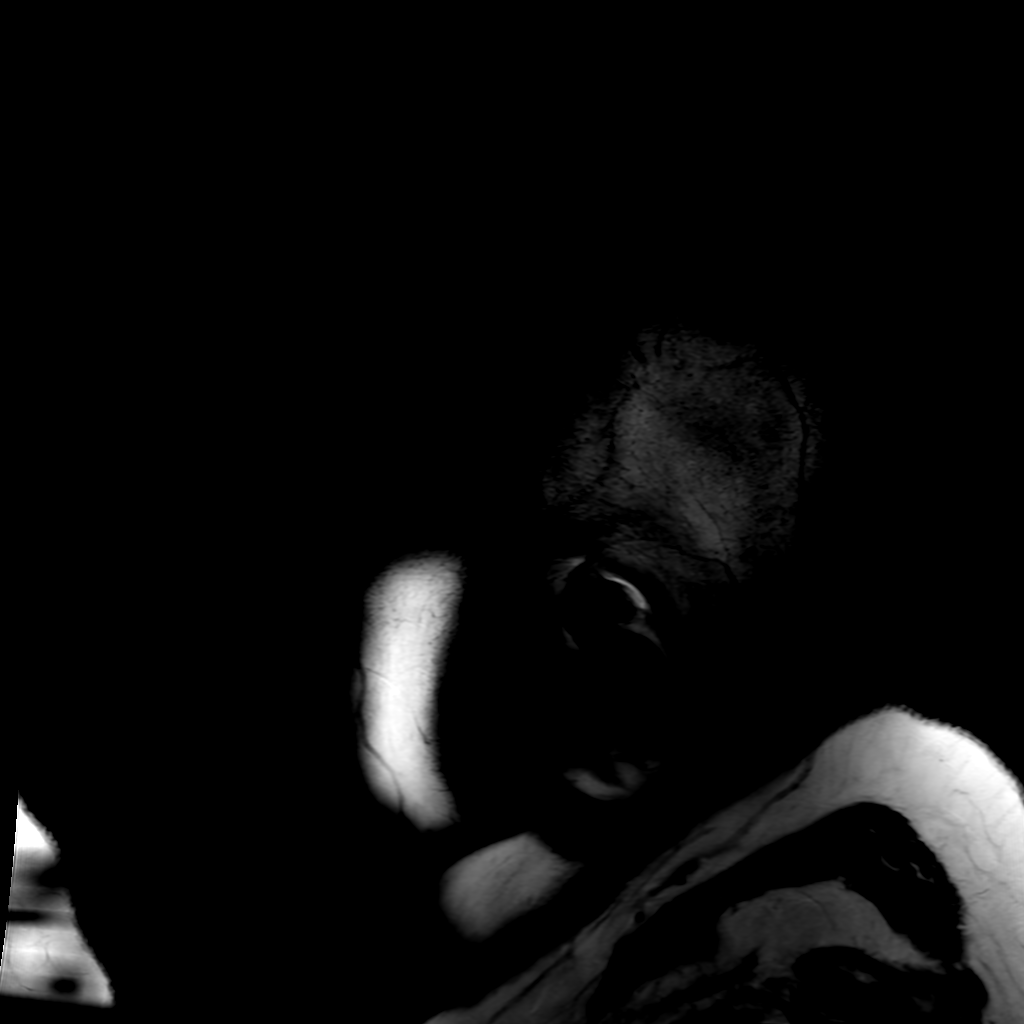
[im 27/27]
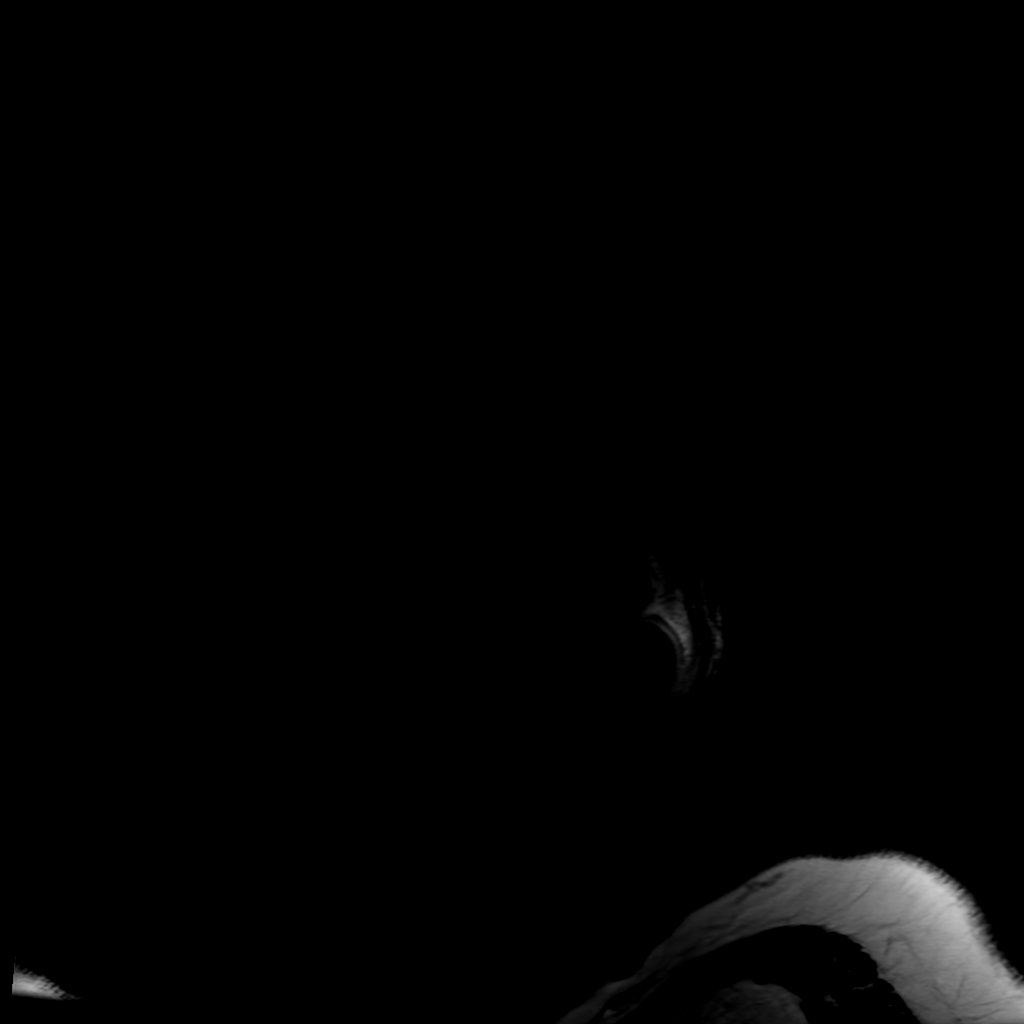

[Series 5: FLAIR · axial · 4.0mm · 0.45mm/px · z∈[-36,+125]mm · 3 of 38 slices shown (2 of 2)]
[im 1/38]
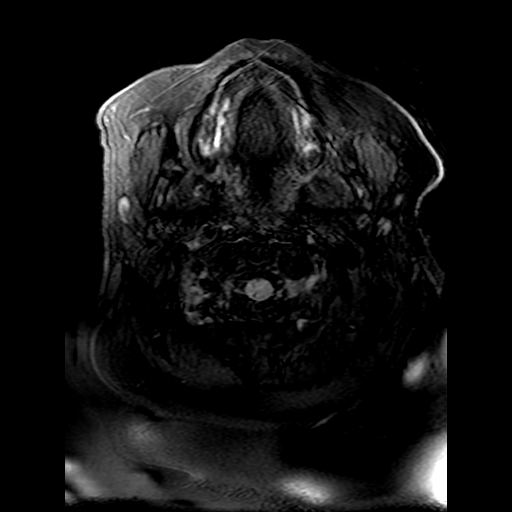
[im 19/38]
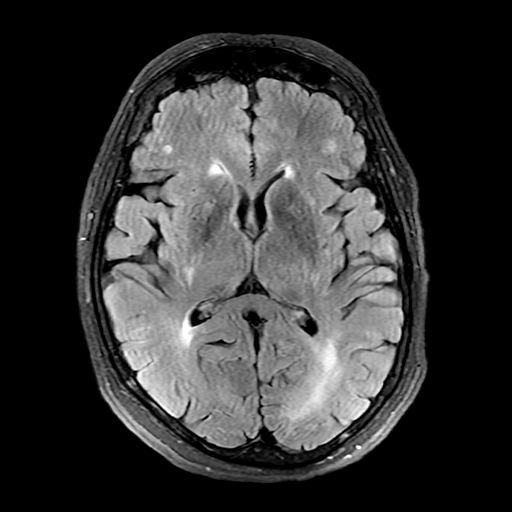
[im 38/38]
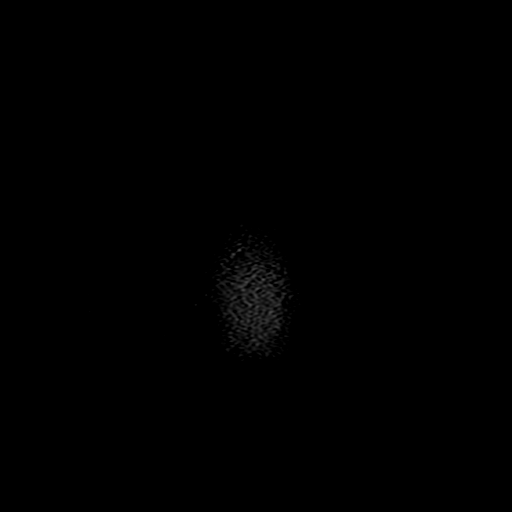

[Series 250: ADC · axial · 3.0mm · 0.94mm/px · z∈[-29,+131]mm · 4 of 54 slices shown (1 of 2)]
[im 1/54]
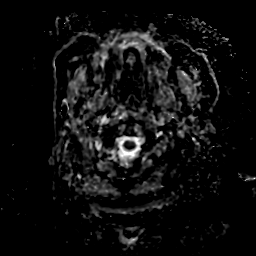
[im 18/54]
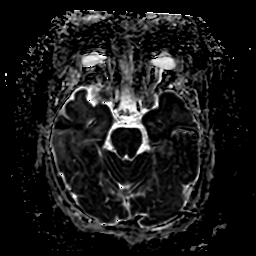
[im 36/54]
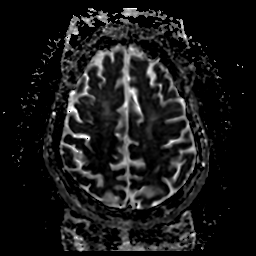
[im 54/54]
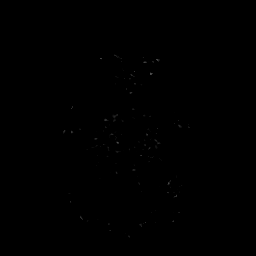

[Series 350: ADC · coronal · 4.0mm · 0.94mm/px · 3 of 40 slices shown (2 of 2)]
[im 1/40]
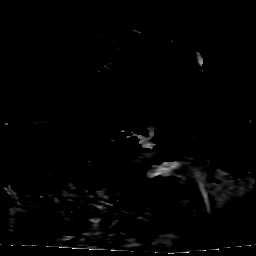
[im 20/40]
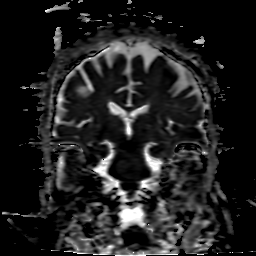
[im 40/40]
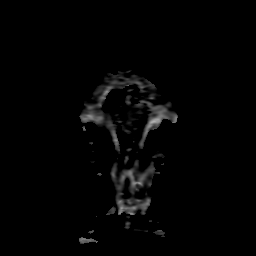

[25 of 48 positions shown; findings below may reference images not displayed]

FINDINGS: Brain: Restricted diffusion with ADC correlate, most prominent in
the in the bilateral anterior temporal lobes, left greater than
right medial temporal lobes, and left temporoparietal region. These
areas are associated with increased T2 signal and gyral swelling. No
hemorrhage, mass, mass effect, or midline shift. T2 hyperintense
signal in the periventricular white matter, likely the sequela of
chronic small vessel ischemic disease.

Vascular: Normal flow voids.

Skull and upper cervical spine: Normal marrow signal.

Sinuses/Orbits: Negative.

Other: The mastoids are well aerated.
IMPRESSION: Restricted diffusion and increased T2 signal, most prominent in the
bilateral anterior temporal lobes, left greater than right medial
temporal lobes, and left temporoparietal region, which is
nonspecific but could be seen in the setting of viral or autoimmune
encephalitis. Seizures can also appears similar.

## 2021-02-17 IMAGING — CT CT HEAD CODE STROKE
4 series · 16 of 47 positions shown, 18 images · non-contrast
Comparison: [DATE]

CLINICAL DATA: Code stroke. Neuro deficit, acute, stroke suspected.
Confusion.

EXAM:
CT HEAD WITHOUT CONTRAST
TECHNIQUE: Contiguous axial images were obtained from the base of the skull
through the vertex without intravenous contrast.

[Series 3: head wo · axial · 0.43mm/px · z∈[-156,-36]mm · 7 of 34 slices shown, 9 images]
[im 5/34  brain]
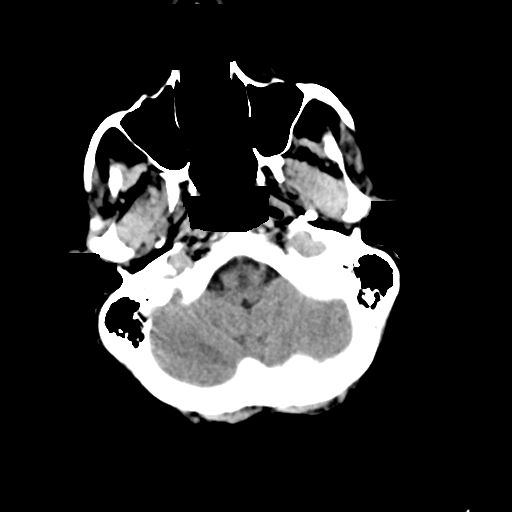
[im 5/34  bone]
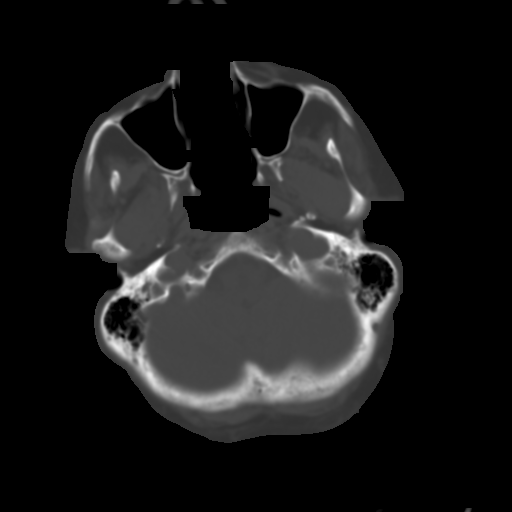
[im 9/34  brain]
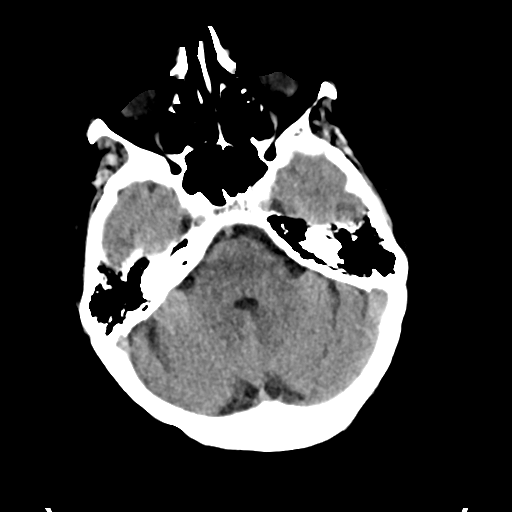
[im 13/34  brain]
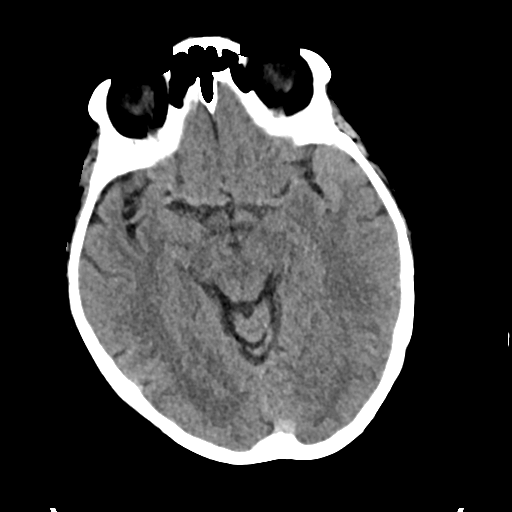
[im 17/34  brain]
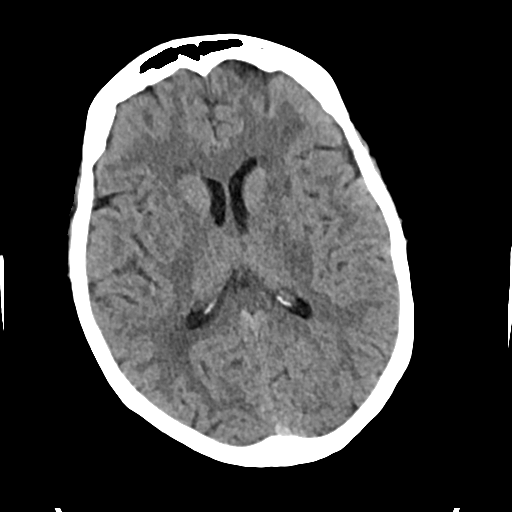
[im 21/34  brain]
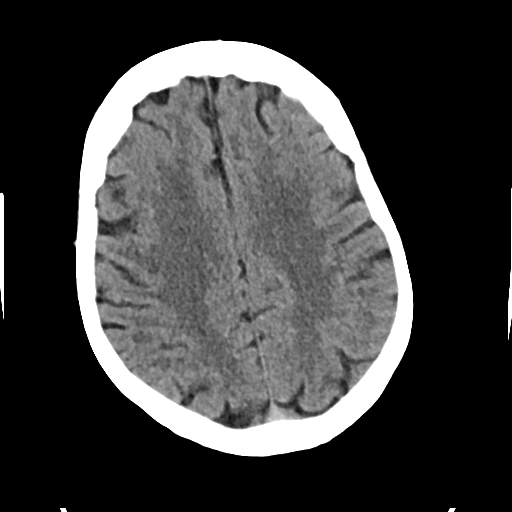
[im 21/34  bone]
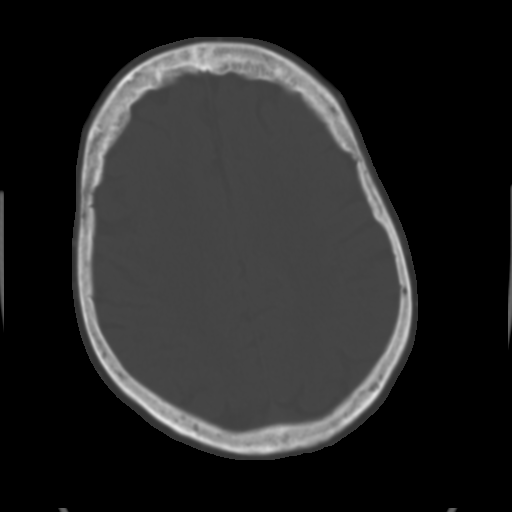
[im 25/34  brain]
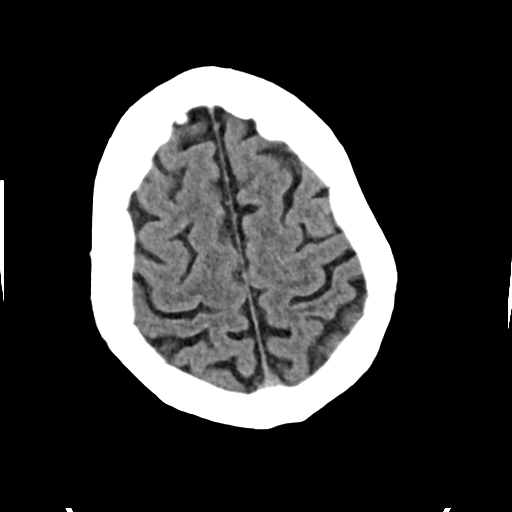
[im 29/34  brain]
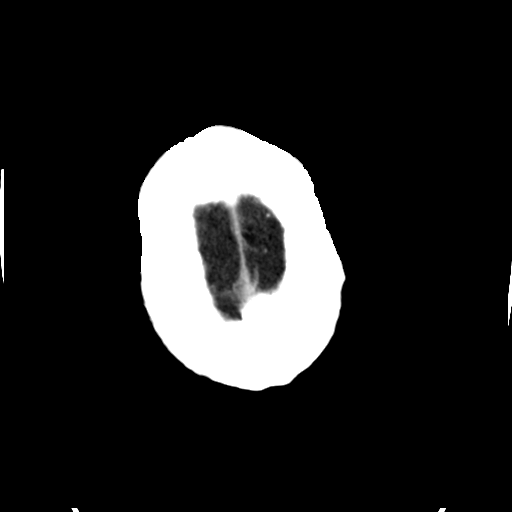

[Series 4: head bone · axial · 0.43mm/px · z∈[-160,-128]mm · 3 of 83 slices shown]
[im 9/83  bone]
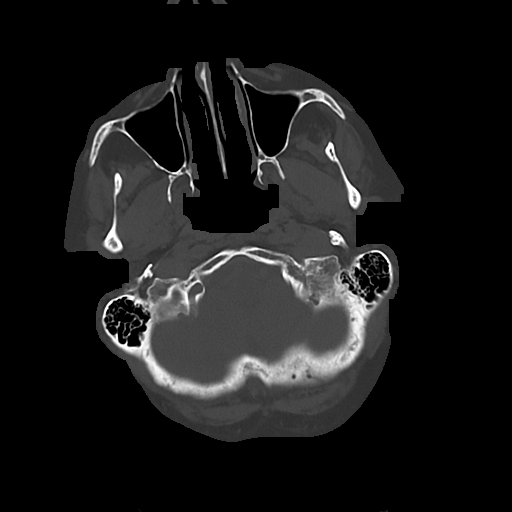
[im 17/83  bone]
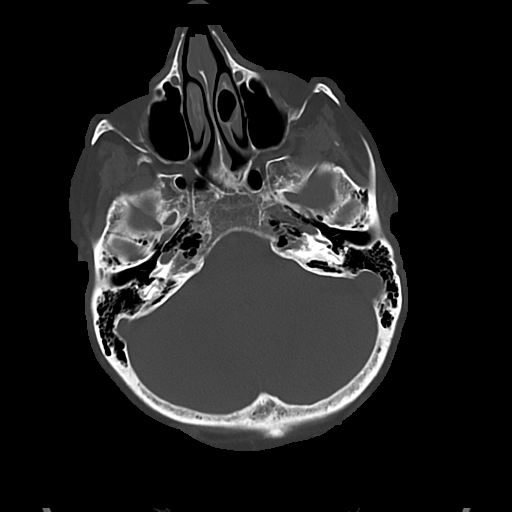
[im 25/83  bone]
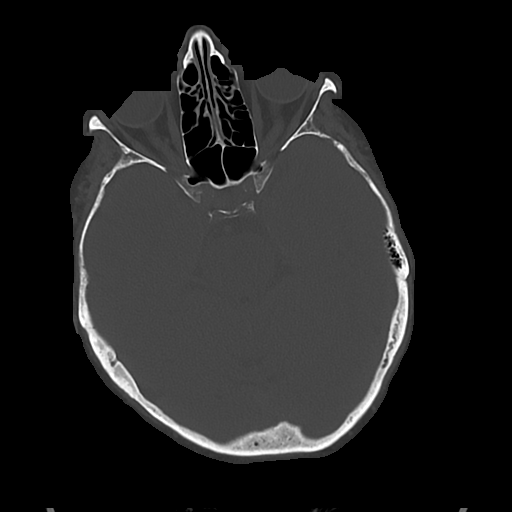

[Series 5: cor soft · coronal · 0.36mm/px · 3 of 70 slices shown]
[im 24/70  brain]
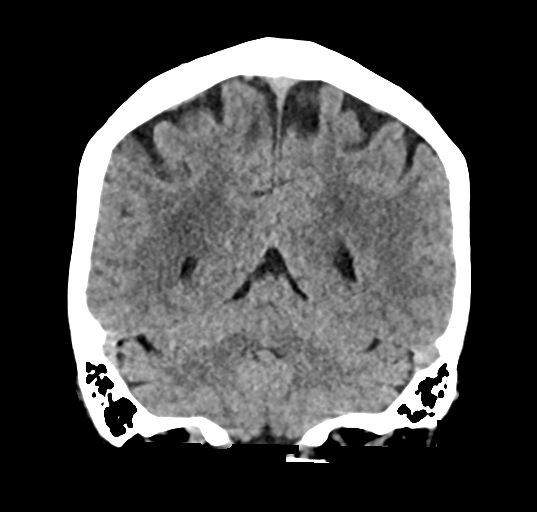
[im 31/70  brain]
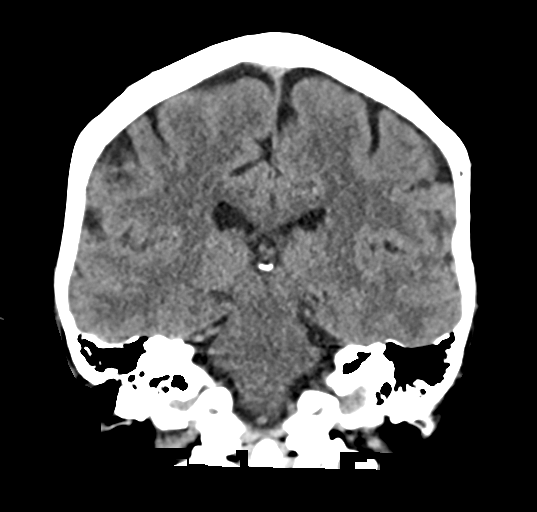
[im 39/70  brain]
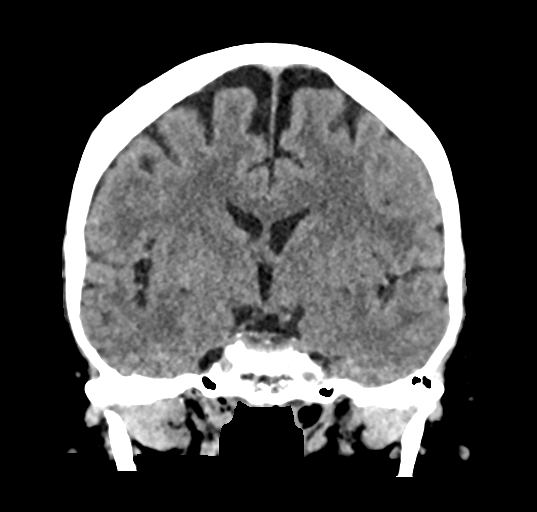

[Series 6: sag soft · sagittal · 0.36mm/px · 3 of 54 slices shown]
[im 18/54  brain]
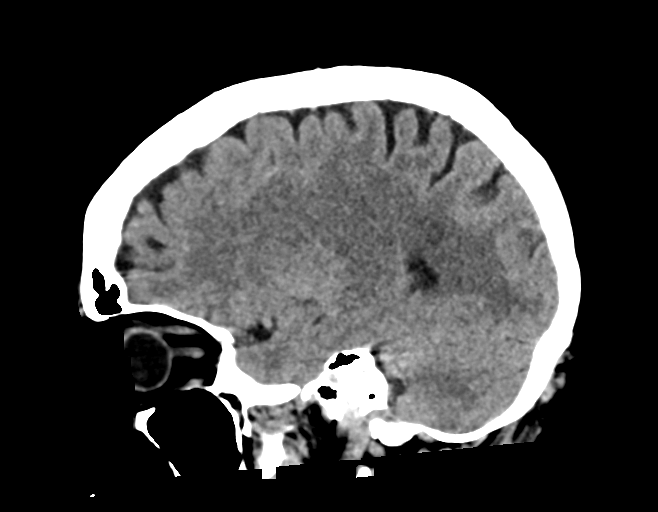
[im 27/54  brain]
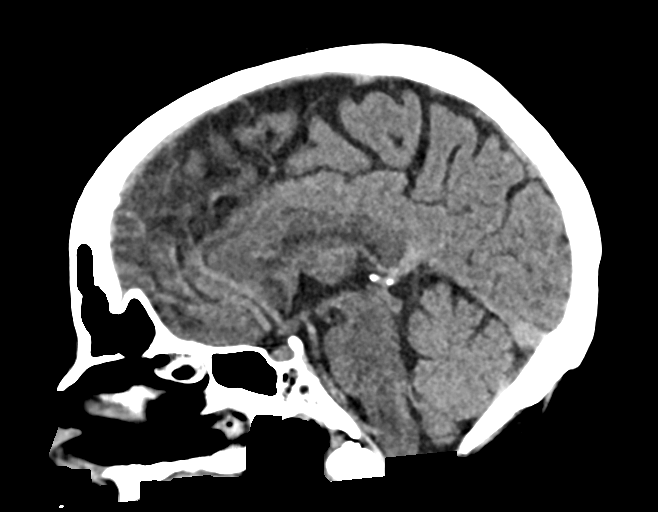
[im 36/54  brain]
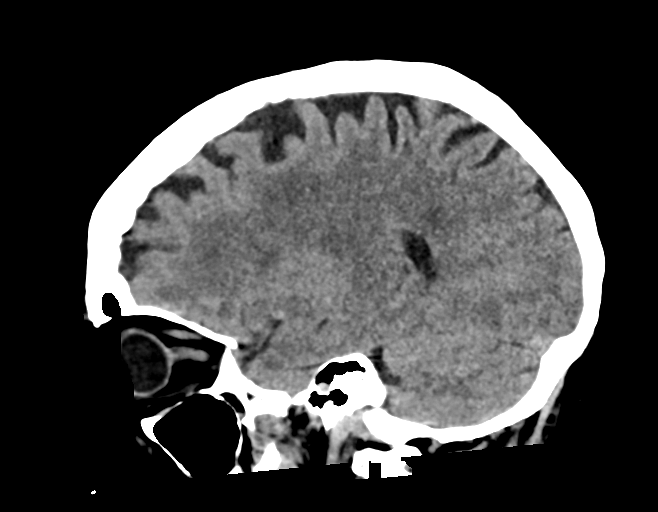

[16 of 47 positions shown; findings below may reference images not displayed]

FINDINGS: Brain: No acute finding by CT. Mild chronic small-vessel ischemic
changes of the white matter in left basal ganglia. No identifiable
acute infarction, mass lesion, hemorrhage, hydrocephalus or
extra-axial collection.

Vascular: There is atherosclerotic calcification of the major
vessels at the base of the brain.

Skull: Negative

Sinuses/Orbits: Clear/normal

Other: None

ASPECTS (Alberta Stroke Program Early CT Score)

- Ganglionic level infarction (caudate, lentiform nuclei, internal
capsule, insula, M1-M3 cortex): 7

- Supraganglionic infarction (M4-M6 cortex): 3

Total score (0-10 with 10 being normal): 10
IMPRESSION: 1. No acute finding by CT. Mild chronic small-vessel ischemic change
of the cerebral hemispheric white matter and left basal ganglia.
2. These results were communicated to Dr. [facility] at [DATE] on
[DATE] by text page via the [employer] messaging system.
3. ASPECTS is 10

## 2021-02-17 IMAGING — US US THYROID
1 series · 13 of 25 positions shown · non-contrast
Comparison: None.

CLINICAL DATA: Thyroid lesion

EXAM:
THYROID ULTRASOUND
TECHNIQUE: Ultrasound examination of the thyroid gland and adjacent soft
tissues was performed.

[Series 1: us thyroid · 60 acquisitions, 13 frames shown]
[im 1/60]
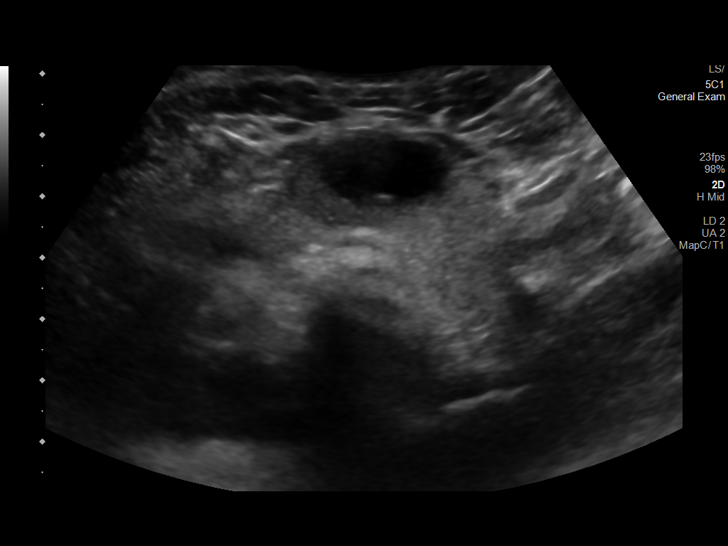
[im 5/60]
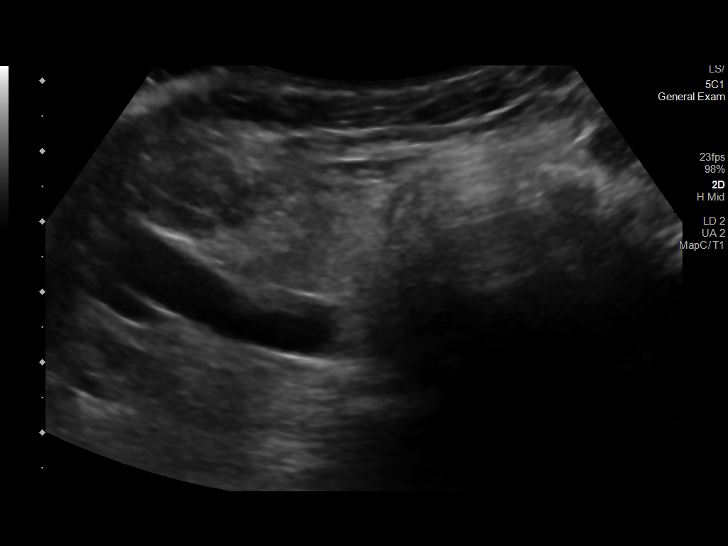
[im 10/60]
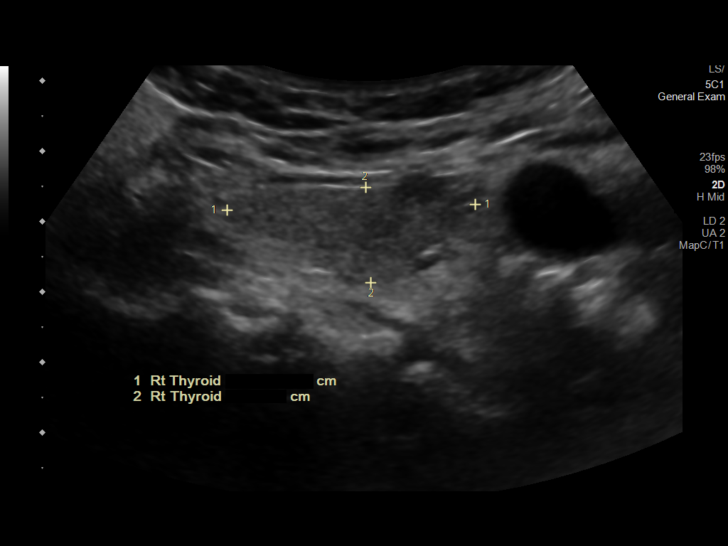
[im 15/60]
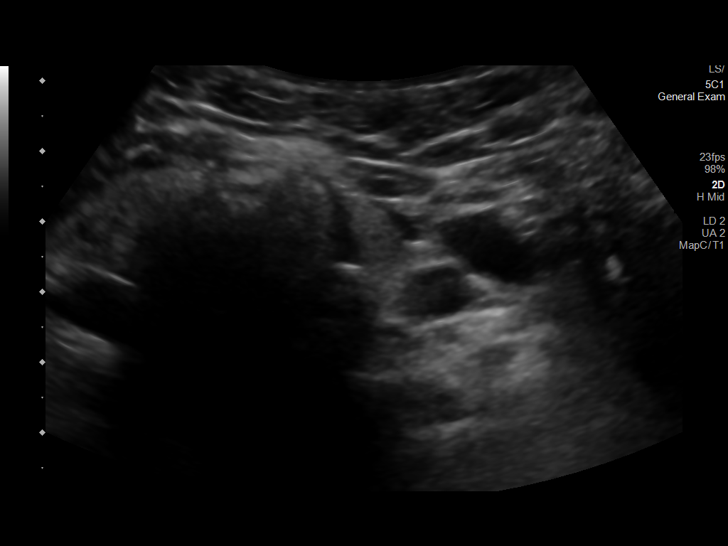
[im 20/60]
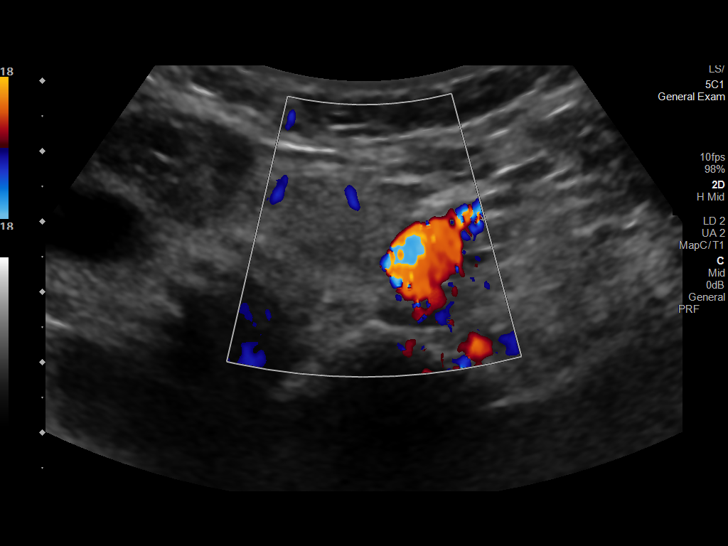
[im 25/60]
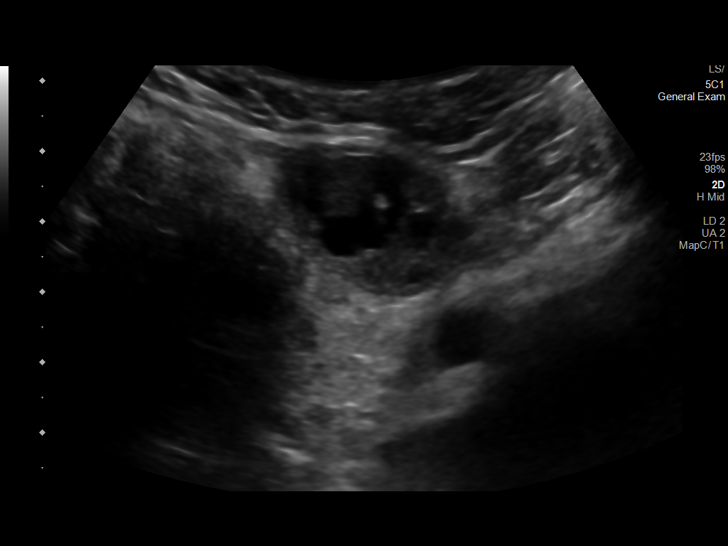
[im 30/60]
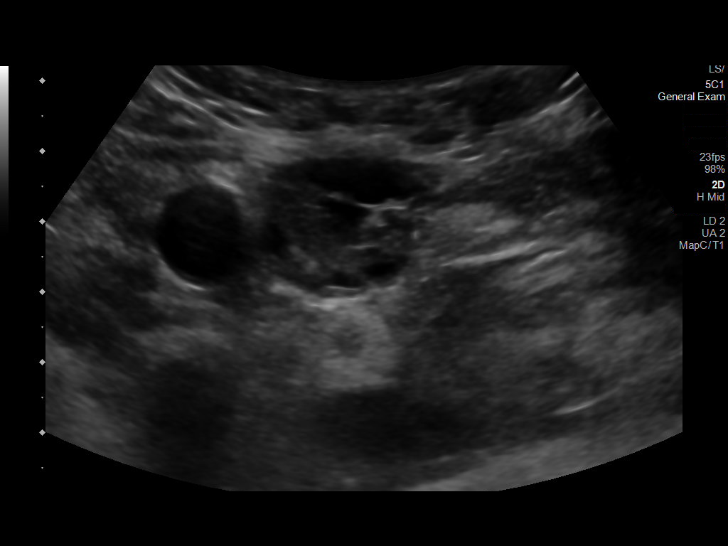
[im 35/60]
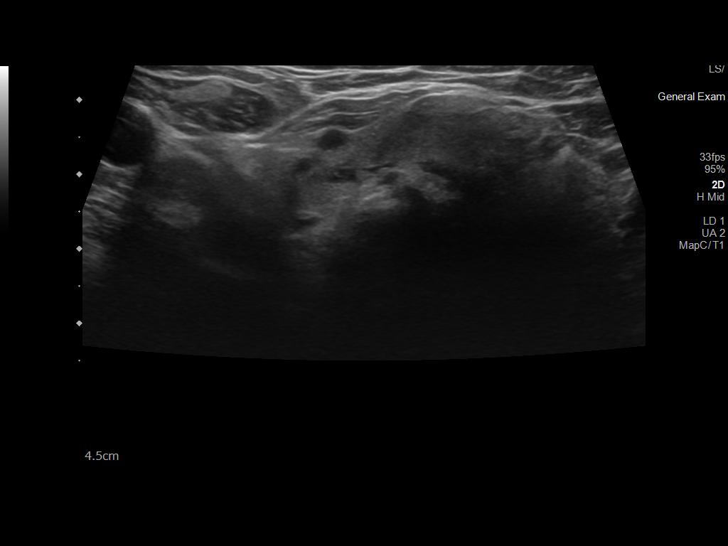
[im 40/60]
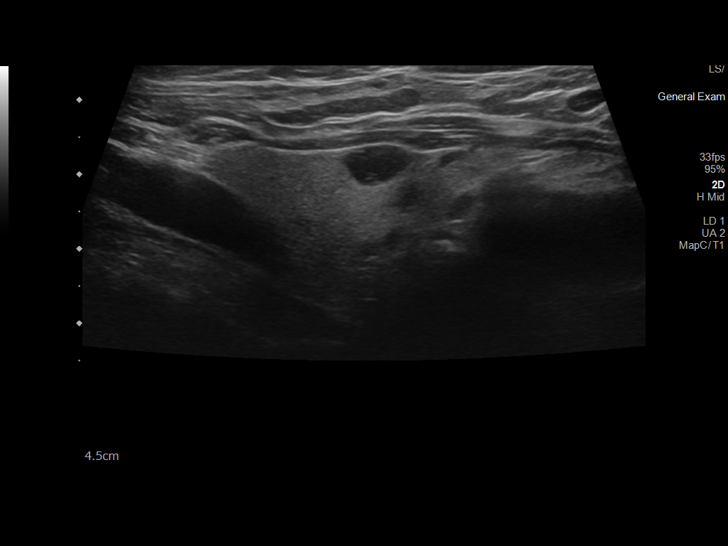
[im 45/60]
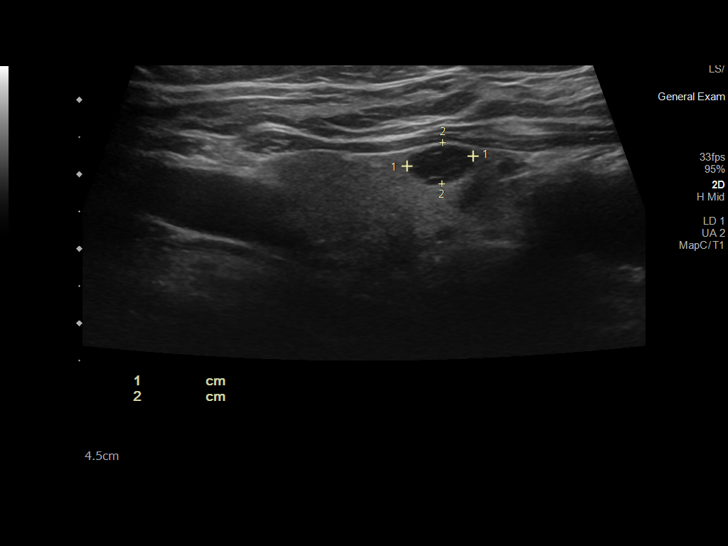
[im 50/60]
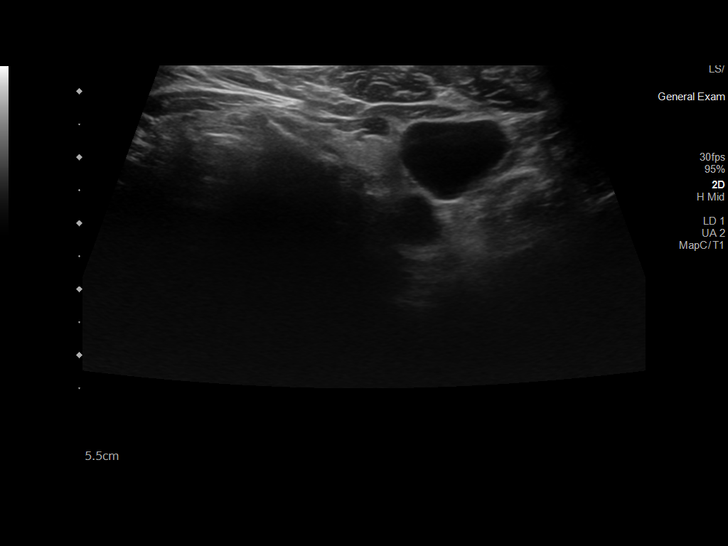
[im 55/60]
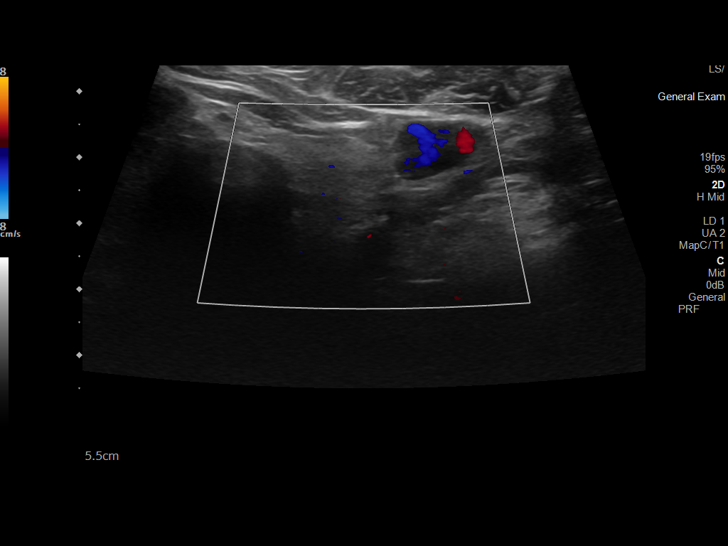
[im 60/60]
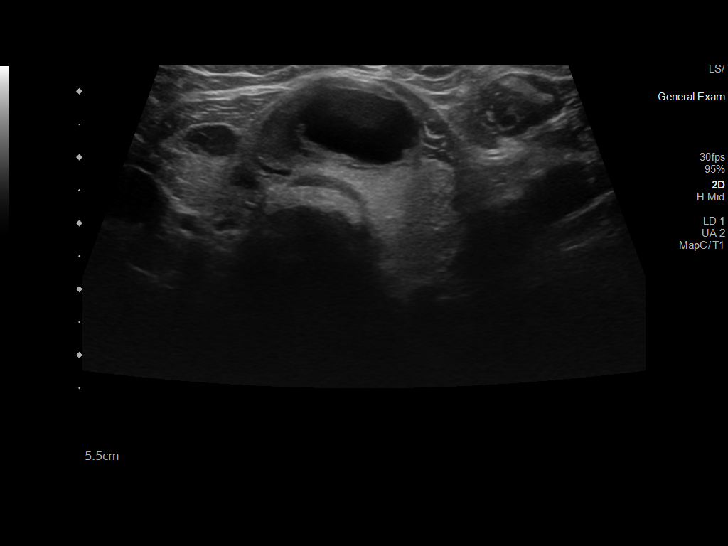

[13 of 25 positions shown; findings below may reference images not displayed]

FINDINGS: Parenchymal Echotexture: Mildly heterogeneous

Isthmus: 0.5 cm

Right lobe: 3.4 x 1.3 x 1.8 cm

Left lobe: 4.6 x 1.8 x 1.5 cm

_________________________________________________________

Estimated total number of nodules >/= 1 cm: 2

Number of spongiform nodules >/=  2 cm not described below (TR1): 0

Number of mixed cystic and solid nodules >/= 1.5 cm not described
below (TR2): 0

_________________________________________________________

Nodule # 1:

Location: Isthmus; superior

Maximum size: 3.1 cm; Other 2 dimensions: 2.6 x 2.0 cm

Composition: mixed cystic and solid (1)

Echogenicity: isoechoic (1)

Shape: not taller-than-wide (0)

Margins: smooth (0)

Echogenic foci: none (0)

ACR TI-RADS total points: 2.

ACR TI-RADS risk category: TR2 (2 points).

ACR TI-RADS recommendations:

This nodule does NOT meet TI-RADS criteria for biopsy or dedicated
follow-up.

_________________________________________________________

0.9 x 0.6 x 0.8 cm cystic nodule in the right superior thyroid lobe
does not meet criteria for imaging surveillance or FNA.
IMPRESSION: Isthmus and right thyroid nodules do not meet criteria for FNA or
imaging surveillance.

No suspicious thyroid nodules.

The above is in keeping with the ACR TI-RADS recommendations - [HOSPITAL] [SH];[DATE].

## 2021-02-17 MED ORDER — LEVETIRACETAM IN NACL 1000 MG/100ML IV SOLN: 1000.0000 mg | Freq: Once | INTRAVENOUS | Status: DC

## 2021-02-17 MED ORDER — INSULIN ASPART 100 UNIT/ML IJ SOLN
0.0000 [IU] | INTRAMUSCULAR | Status: DC
  Administered 2021-02-18: 1 [IU] via SUBCUTANEOUS

## 2021-02-17 MED ORDER — SODIUM CHLORIDE 0.9 % IV SOLN: 750.0000 mg | Freq: Two times a day (BID) | INTRAVENOUS | Status: DC

## 2021-02-17 MED ORDER — LEVETIRACETAM IN NACL 1000 MG/100ML IV SOLN
1000.0000 mg | Freq: Two times a day (BID) | INTRAVENOUS | Status: DC
  Administered 2021-02-17 – 2021-02-18 (×2): 1000 mg via INTRAVENOUS
  Filled 2021-02-17 (×2): qty 100

## 2021-02-17 MED ORDER — SODIUM CHLORIDE 0.9 % IV SOLN
2.0000 g | INTRAVENOUS | Status: DC
  Administered 2021-02-18 – 2021-02-19 (×10): 2 g via INTRAVENOUS
  Filled 2021-02-17 (×16): qty 2000

## 2021-02-17 MED ORDER — LORAZEPAM 2 MG/ML IJ SOLN
INTRAMUSCULAR | Status: AC
  Administered 2021-02-17: 2 mg via INTRAVENOUS
  Filled 2021-02-17: qty 1

## 2021-02-17 MED ORDER — LEVETIRACETAM 750 MG PO TABS: 750.0000 mg | ORAL_TABLET | Freq: Two times a day (BID) | ORAL | Status: DC

## 2021-02-17 MED ORDER — MIDAZOLAM HCL 2 MG/2ML IJ SOLN: 2.0000 mg | Freq: Once | INTRAMUSCULAR | Status: AC

## 2021-02-17 MED ORDER — MIDAZOLAM HCL 2 MG/2ML IJ SOLN
INTRAMUSCULAR | Status: AC
  Filled 2021-02-17: qty 2

## 2021-02-17 MED ORDER — DEXTROSE 5 % IV SOLN
10.0000 mg/kg | Freq: Three times a day (TID) | INTRAVENOUS | Status: AC
  Administered 2021-02-17 – 2021-02-18 (×3): 810 mg via INTRAVENOUS
  Filled 2021-02-17 (×4): qty 16.2

## 2021-02-17 MED ORDER — VANCOMYCIN HCL 10 G IV SOLR
2250.0000 mg | Freq: Once | INTRAVENOUS | Status: AC
  Administered 2021-02-18: 2250 mg via INTRAVENOUS
  Filled 2021-02-17: qty 2250

## 2021-02-17 MED ORDER — VANCOMYCIN HCL 10 G IV SOLR: 15.0000 mg/kg | Freq: Three times a day (TID) | INTRAVENOUS | Status: DC

## 2021-02-17 MED ORDER — ONDANSETRON HCL 4 MG/2ML IJ SOLN: 4.0000 mg | Freq: Four times a day (QID) | INTRAMUSCULAR | Status: DC | PRN

## 2021-02-17 MED ORDER — SODIUM CHLORIDE 0.9 % IV SOLN: INTRAVENOUS | Status: DC

## 2021-02-17 MED ORDER — LORAZEPAM 2 MG/ML IJ SOLN: 2.0000 mg | Freq: Once | INTRAMUSCULAR | Status: AC

## 2021-02-17 MED ORDER — VANCOMYCIN HCL 10 G IV SOLR: 25.0000 mg/kg | Freq: Once | INTRAVENOUS | Status: DC

## 2021-02-17 MED ORDER — SODIUM CHLORIDE 0.9 % IV SOLN
2.0000 g | Freq: Two times a day (BID) | INTRAVENOUS | Status: DC
  Administered 2021-02-17 – 2021-02-19 (×4): 2 g via INTRAVENOUS
  Filled 2021-02-17 (×7): qty 20

## 2021-02-17 MED ORDER — SODIUM CHLORIDE 0.9% FLUSH
3.0000 mL | Freq: Once | INTRAVENOUS | Status: DC
Start: 2021-02-17 — End: 2021-02-24

## 2021-02-17 MED ORDER — IOHEXOL 350 MG/ML SOLN
100.0000 mL | Freq: Once | INTRAVENOUS | Status: AC | PRN
  Administered 2021-02-17: 100 mL via INTRAVENOUS

## 2021-02-17 NOTE — H&P (Signed)
Hend Mccarrell IWP:[id number] DOB: September 28, 1950 DOA: 02/17/2021     PCP: Mar Daring, PA-C   Outpatient Specialists:      Patient arrived to ER on 02/17/21 at 1637 Referred by Attending Deno Etienne, DO   Patient coming from: home Lives With family    Chief Complaint:  provide a chief complaint due to nonverbal status Chief Complaint  Patient presents with   Code Stroke    HPI: Robin Adams is a 70 y.o. female with medical history significant of HTN, ovarian cystic masses s/p hysterectomy and bilateral oophorectomy, obesity, and tobacco dependence    Presented with   confusion, with global aphasia or dysarthria LKW yesterday No fever or chills   She no longer takes any home meds Family reports for the past few days she has not been feeling very well and actually had trouble coming down the stairs because feeling something was off She ate dinner last night and then went upstairs this afternoon and family checked on her she seemed to be very confused and unable to follow basic directions. They eventually were able to get her to come downstairs and she sat down and went briefly unresponsive after which she stopped talking. Does not go to doctor much  She continues to smoke about a pack a day does not drink alcohol Has   been vaccinated against COVID    Initial COVID TEST  NEGATIVE   Lab Results  Component Value Date   [place] 02/17/2021   [place] Not Detected 01/01/2020   [place] Not Detected 09/28/2018    Regarding pertinent Chronic problems:       HTN  no longer takes Norvasc     Morbid obesity-   BMI Readings from Last 1 Encounters:  02/17/21 40.56 kg/m      While in ER: MRI with bilateral temporal encephalopathy CXR showing cardiomegaly CTA neg except for thyroid lesion   ED Triage Vitals  Enc Vitals Group     BP 02/17/21 1649 (!) 189/99     Pulse Rate 02/17/21 1815 (!) 103     Resp 02/17/21 1815 19     Temp --      Temp  src --      SpO2 02/17/21 1649 98 %     Weight 02/17/21 1600 251 lb 5.2 oz (114 kg)     Height 02/17/21 1815 5\' 6"  (1.676 m)     Head Circumference --      Peak Flow --      Pain Score --      Pain Loc --      Pain Edu? --      Excl. in [place]? --   TMAX(24)@     _________________________________________ Significant initial  Findings: Abnormal Labs Reviewed  CBC - Abnormal; Notable for the following components:      Result Value   WBC 13.4 (*)    RBC 5.26 (*)    HCT 46.1 (*)    All other components within normal limits  DIFFERENTIAL - Abnormal; Notable for the following components:   Neutro Abs 10.1 (*)    Abs Immature Granulocytes 0.25 (*)    All other components within normal limits  COMPREHENSIVE METABOLIC PANEL - Abnormal; Notable for the following components:   Glucose, Bld 201 (*)    Calcium 8.7 (*)    Albumin 3.3 (*)    All other components within normal limits  CBG MONITORING, ED - Abnormal; Notable for the following components:  Glucose-Capillary 195 (*)    All other components within normal limits  I-STAT CHEM 8, ED - Abnormal; Notable for the following components:   Glucose, Bld 194 (*)    Calcium, Ion 1.13 (*)    Hemoglobin 15.6 (*)    All other components within normal limits  I-STAT VENOUS BLOOD GAS, ED - Abnormal; Notable for the following components:   pO2, Ven 121.0 (*)    Calcium, Ion 1.12 (*)    Hemoglobin 15.3 (*)    All other components within normal limits   ____________________________________________ Ordered CT HEAD   NON acute  CXR - cardiomegaly   CTA neg   MRI brain - Restricted diffusion and increased T2 signal, most prominent in the bilateral anterior temporal lobes, left greater than right medial temporal lobes, and left temporoparietal region, which is nonspecific but could be seen in the setting of viral or autoimmune encephalitis.      _________________________ Troponin   ECG: Ordered Personally reviewed by me showing: HR :  102 Rhythm:  Sinus tachycardia    no evidence of ischemic changes QTC 511  The recent clinical data is shown below. Vitals:   02/17/21 1900 02/17/21 1930 02/17/21 1945 02/17/21 2000  BP: (!) 172/106 (!) 143/84 (!) 155/90   Pulse: 99 100 98 (!) 101  Resp: (!) 21  15 20   SpO2: 98% 98% 100% 98%  Weight:      Height:         WBC     Component Value Date/Time   WBC 13.4 (H) 02/17/2021 1638   LYMPHSABS 2.1 02/17/2021 1638   LYMPHSABS 1.8 09/10/2018 1159   MONOABS 0.6 02/17/2021 1638   EOSABS 0.2 02/17/2021 1638   EOSABS 0.3 09/10/2018 1159   BASOSABS 0.1 02/17/2021 1638   BASOSABS 0.1 09/10/2018 1159       UA   ordered   Results for orders placed or performed during the hospital encounter of 02/17/21  Resp Panel by RT-PCR (Flu A&B, Covid) Nasopharyngeal Swab     Status: None   Collection Time: 02/17/21  7:10 PM   Specimen: Nasopharyngeal Swab; Nasopharyngeal(NP) swabs in vial transport medium  Result Value Ref Range Status   SARS Coronavirus 2 by RT PCR NEGATIVE NEGATIVE Final         Influenza A by PCR NEGATIVE NEGATIVE Final   Influenza B by PCR NEGATIVE NEGATIVE Final           _______________________________________________________ ER Provider Called:  neurology     They Recommend admit to medicine recommended 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  The following Work up has been ordered so far:  Orders Placed This Encounter  Procedures   Lumbar Puncture   CSF culture w Gram Stain   Resp Panel by RT-PCR (Flu A&B, Covid) Nasopharyngeal Swab   CT HEAD CODE STROKE WO CONTRAST   MR BRAIN WO CONTRAST   CT ANGIO HEAD NECK W WO CM W PERF (CODE STROKE)   DG Chest Port 1 View   MR BRAIN W CONTRAST   Protime-INR   APTT   CBC   Differential   Comprehensive metabolic panel    CSF cell count with differential collection tube #: 1   CSF cell count with differential collection tube #: 4   Protein and glucose, CSF   HIV Antibody (routine testing  w rflx)   Oligoclonal bands, CSF + serum   Draw extra clot tube   Cryptococcal antigen, CSF   HSV 1/2 PCR, CSF Cerebrospinal Fluid   Miscellaneous [facility] test (send-out)   Miscellaneous [facility] test (send-out)   HSV(herpes simplex vrs) 1+2 ab-IgG   Epstein-Barr virus VCA, IgG   Epstein-Barr virus VCA, IgM   RSV(respiratory syncytial virus) ab   CMV IgM   Miscellaneous [facility] test (send-out)   Miscellaneous [facility] test (send-out)   Urinalysis, Routine w reflex microscopic   Diet NPO time specified   Cardiac monitoring   Stroke swallow screen   NIH Stroke Scale   Modified Stroke Scale (mNIHSS) Document mNIHSS assessment every 2 hours for a total of 12 hours   Saline Lock IV, Maintain IV access   If O2 sat   Clerk to Activate Code Stroke   acyclovir (ZOVIRAX) per pharmacy consult   Consult to hospitalist   vancomycin per pharmacy consult   Pulse oximetry, continuous   I-stat chem 8, ED   CBG monitoring, ED   CBG monitoring, ED   I-Stat venous blood gas, ED   ED EKG   EKG 12-Lead   Overnight EEG with video   Seizure precautions    Following Medications were ordered in ER: Medications  sodium chloride flush (NS) 0.9 % injection 3 mL (3 mLs Intravenous Not Given 02/17/21 1820)  acyclovir (ZOVIRAX) 810 mg in dextrose 5 % 150 mL IVPB (810 mg Intravenous New Bag/Given 02/17/21 1916)  0.9 %  sodium chloride infusion (has no administration in time range)  ampicillin (OMNIPEN) 2 g in sodium chloride 0.9 % 100 mL IVPB (has no administration in time range)  cefTRIAXone (ROCEPHIN) 2 g in sodium chloride 0.9 % 100 mL IVPB (has no administration in time range)  vancomycin (VANCOCIN) 2,250 mg in sodium chloride 0.9 % 500 mL IVPB (has no administration in time range)  iohexol (OMNIPAQUE) 350 MG/ML injection 100 mL (100  mLs Intravenous Contrast Given 02/17/21 1705)  LORazepam (ATIVAN) injection 2 mg (2 mg Intravenous Given 02/17/21 1738)  midazolam (VERSED) injection 2 mg ( Intravenous Given 02/17/21 1928)        Consult Orders  (From admission, onward)           Start     Ordered   02/17/21 1944  Consult to hospitalist  Paged by jasmine  Once       Provider:  (Not yet assigned)  Question Answer Comment  Place call to: [facility]   Reason for Consult Admit      02/17/21 1943               OTHER Significant initial  Findings:  labs showing:    Recent Labs  Lab 02/17/21 1638 02/17/21 1643 02/17/21 1845  NA 139 141 140  K 3.8 3.6 3.5  CO2 23  --   --   GLUCOSE 201* 194*  --   BUN 15 16  --   CREATININE 0.96 0.90  --   CALCIUM 8.7*  --   --     Cr  stable,    Lab Results  Component Value Date   CREATININE 0.90 02/17/2021   CREATININE 0.96 02/17/2021   CREATININE 0.87 09/10/2018    Recent Labs  Lab 02/17/21 1638  AST 19  ALT 17  ALKPHOS 100  BILITOT 0.3  PROT 6.6  ALBUMIN 3.3*   Lab Results  Component Value Date   CALCIUM 8.7 (L) 02/17/2021  Plt: Lab Results  Component Value Date   PLT 185 02/17/2021       COVID-19 Labs  No results for input(s): DDIMER, FERRITIN, LDH, CRP in the last 72 hours.  Lab Results  Component Value Date   SARSCOV2NAA NEGATIVE 02/17/2021   [place] Not Detected 01/01/2020   [place] Not Detected 09/28/2018    Venous  Blood Gas result:  pH 7.307 pCO2  52.8  ABG    Component Value Date/Time   HCO3 26.4 02/17/2021 1845   TCO2 28 02/17/2021 1845   ACIDBASEDEF 1.0 02/17/2021 1845   O2SAT 98.0 02/17/2021 1845         Recent Labs  Lab 02/17/21 1638 02/17/21 1643 02/17/21 1845  WBC 13.4*  --   --   NEUTROABS 10.1*  --   --   HGB 14.9 15.6* 15.3*  HCT 46.1* 46.0 45.0  MCV 87.6  --   --   PLT 185  --   --     HG/HCT  stable,       Component Value Date/Time   HGB 15.3 (H) 02/17/2021 1845    HGB 14.9 09/10/2018 1159   HCT 45.0 02/17/2021 1845   HCT 45.1 09/10/2018 1159   MCV 87.6 02/17/2021 1638   MCV 85 09/10/2018 1159   MCV 87 12/16/2011 1212     DM  labs:  HbA1C: No results for input(s): HGBA1C in the last 8760 hours.     CBG (last 3)  Recent Labs    02/17/21 1638  GLUCAP 195*          Cultures: No results found for: SDES, [place], CULT, REPTSTATUS   Radiological Exams on Admission: MR BRAIN WO CONTRAST  Result Date: 02/17/2021 CLINICAL DATA:  Neuro deficit, acute, stroke suspected EXAM: MRI HEAD WITHOUT CONTRAST TECHNIQUE: Multiplanar, multiecho pulse sequences of the brain and surrounding structures were obtained without intravenous contrast. COMPARISON:  No prior MRI, correlation made with CT head 02/17/2021. FINDINGS: Brain: Restricted diffusion with ADC correlate, most prominent in the in the bilateral anterior temporal lobes, left greater than right medial temporal lobes, and left temporoparietal region. These areas are associated with increased T2 signal and gyral swelling. No hemorrhage, mass, mass effect, or midline shift. T2 hyperintense signal in the periventricular white matter, likely the sequela of chronic small vessel ischemic disease. Vascular: Normal flow voids. Skull and upper cervical spine: Normal marrow signal. Sinuses/Orbits: Negative. Other: The mastoids are well aerated. IMPRESSION: Restricted diffusion and increased T2 signal, most prominent in the bilateral anterior temporal lobes, left greater than right medial temporal lobes, and left temporoparietal region, which is nonspecific but could be seen in the setting of viral or autoimmune encephalitis. Seizures can also appears similar. Electronically Signed   By: Merilyn Baba M.D.   On: 02/17/2021 18:13   DG Chest Port 1 View  Result Date: 02/17/2021 CLINICAL DATA:  Shortness of breath EXAM: PORTABLE CHEST 1 VIEW COMPARISON:  Portable exam 1823 hours compared to 01/02/2017 FINDINGS:  Enlargement of cardiac silhouette with pulmonary vascular congestion. Decreased lung volumes versus previous exam with mild bibasilar atelectasis. Scattered interstitial prominence, question mild pulmonary edema. No pleural effusion or pneumothorax. Bones demineralized. IMPRESSION: Enlargement of cardiac silhouette with pulmonary vascular congestion and interstitial prominence question pulmonary edema. Bibasilar atelectasis. Electronically Signed   By: Lavonia Dana M.D.   On: 02/17/2021 18:35   CT HEAD CODE STROKE WO CONTRAST  Result Date: 02/17/2021 CLINICAL DATA:  Code stroke. Neuro deficit, acute, stroke suspected. Confusion. EXAM:  CT HEAD WITHOUT CONTRAST TECHNIQUE: Contiguous axial images were obtained from the base of the skull through the vertex without intravenous contrast. COMPARISON:  01-11-2012 FINDINGS: Brain: No acute finding by CT. Mild chronic small-vessel ischemic changes of the white matter in left basal ganglia. No identifiable acute infarction, mass lesion, hemorrhage, hydrocephalus or extra-axial collection. Vascular: There is atherosclerotic calcification of the major vessels at the base of the brain. Skull: Negative Sinuses/Orbits: Clear/normal Other: None ASPECTS ([place] Stroke Program Early CT Score) - Ganglionic level infarction (caudate, lentiform nuclei, internal capsule, insula, M1-M3 cortex): 7 - Supraganglionic infarction (M4-M6 cortex): 3 Total score (0-10 with 10 being normal): 10 IMPRESSION: 1. No acute finding by CT. Mild chronic small-vessel ischemic change of the cerebral hemispheric white matter and left basal ganglia. 2. These results were communicated to Dr. Curly Shores at 4:49 pm on 2021/03/15 by text page via the [facility] messaging system. 3. ASPECTS is 10 Electronically Signed   By: Nelson Chimes M.D.   On: 15-Mar-2021 16:50   CT ANGIO HEAD NECK W WO CM W PERF (CODE STROKE)  Result Date: 2021/03/15 CLINICAL DATA:  Aphasia, confusion, stroke suspected EXAM: CT ANGIOGRAPHY HEAD  AND NECK CT PERFUSION BRAIN TECHNIQUE: Multidetector CT imaging of the head and neck was performed using the standard protocol during bolus administration of intravenous contrast. Multiplanar CT image reconstructions and MIPs were obtained to evaluate the vascular anatomy. Carotid stenosis measurements (when applicable) are obtained utilizing NASCET criteria, using the distal internal carotid diameter as the denominator. Multiphase CT imaging of the brain was performed following IV bolus contrast injection. Subsequent parametric perfusion maps were calculated using RAPID software. CONTRAST:  193mL OMNIPAQUE IOHEXOL 350 MG/ML SOLN COMPARISON:  03/15/2021 CT head codes stroke, January 11, 2012 CT head. FINDINGS: CT HEAD FINDINGS Please see same day CT head code stroke for noncontrast findings. CTA NECK FINDINGS Aortic arch: Standard branching. Imaged portion shows no evidence of aneurysm or dissection. No significant stenosis of the major arch vessel origins. Right carotid system: No evidence of dissection, stenosis (50% or greater) or occlusion. Left carotid system: No evidence of dissection, stenosis (50% or greater) or occlusion. Vertebral arteries: Codominant. No evidence of dissection, stenosis (50% or greater) or occlusion. Skeleton: No acute osseous abnormality. Other neck: Hypoattenuating lesion in the thyroid isthmus measures up to 1.5 x 1.9 x 2.8 cm. The neck is otherwise negative. Upper chest: Dependent atelectasis. No pleural effusion or focal pulmonary opacity. Review of the MIP images confirms the above findings CTA HEAD FINDINGS Anterior circulation: Both internal carotid arteries are patent to the termini, without stenosis or other abnormality. A1 segments patent. Normal anterior communicating artery. Anterior cerebral arteries are patent to their distal aspects. No M1 stenosis or occlusion. Normal MCA bifurcations. Distal MCA branches perfused and symmetric. Posterior circulation: Vertebral arteries widely  patent to the vertebrobasilar junction without stenosis. Posterior inferior cerebral arteries patent bilaterally. Basilar patent to its distal aspect. Superior cerebral arteries patent bilaterally. Bilateral posterior communicating arteries are visualized. Diminutive right P1, with near fetal origin of the right PCA. Normal left P1. PCAs well perfused to their distal aspects without stenosis. Venous sinuses: As permitted by contrast timing, patent. Anatomic variants: None significant Review of the MIP images confirms the above findings CT Brain Perfusion Findings: ASPECTS: 10 CBF (<30%) Volume: 10mL Perfusion (Tmax>6.0s) volume: 93mL Mismatch Volume: 13mL Infarction Location:None IMPRESSION: 1. No intracranial large vessel occlusion or hemodynamically significant stenosis. 2. No hemodynamically significant stenosis in the neck. 3. CT perfusion demonstrates no infarct core or  penumbra. 4. Incidental note is made of hypoattenuating lesion in the thyroid isthmus, measuring up to 2.8 cm. If this has not previously been evaluated, an ultrasound of the thyroid is recommended. Electronically Signed   By: Merilyn Baba M.D.   On: 02/17/2021 17:17   _______________________________________________________________________________________________________ Latest   Blood pressure (!) 155/90, pulse (!) 101, resp. rate 20, height 5\' 6"  (1.676 m), weight 114 kg, SpO2 98 %.   Review of Systems:    Pertinent positives include:   confusion fatigue,  Constitutional:  No weight loss, night sweats, Fevers, chills,  weight loss  HEENT:  No headaches, Difficulty swallowing,Tooth/dental problems,Sore throat,  No sneezing, itching, ear ache, nasal congestion, post nasal drip,  Cardio-vascular:  No chest pain, Orthopnea, PND, anasarca, dizziness, palpitations.no Bilateral lower extremity swelling  GI:  No heartburn, indigestion, abdominal pain, nausea, vomiting, diarrhea, change in bowel habits, loss of appetite, melena, blood in  stool, hematemesis Resp:  no shortness of breath at rest. No dyspnea on exertion, No excess mucus, no productive cough, No non-productive cough, No coughing up of blood.No change in color of mucus.No wheezing. Skin:  no rash or lesions. No jaundice GU:  no dysuria, change in color of urine, no urgency or frequency. No straining to urinate.  No flank pain.  Musculoskeletal:  No joint pain or no joint swelling. No decreased range of motion. No back pain.  Psych:  No change in mood or affect. No depression or anxiety. No memory loss.  Neuro: no localizing neurological complaints, no tingling, no weakness, no double vision, no gait abnormality, no slurred speech, no  All systems reviewed and apart from [place] all are negative _______________________________________________________________________________________________ Past Medical History:   Past Medical History:  Diagnosis Date   Hernia, hiatal    Hypertension       Past Surgical History:  Procedure Laterality Date   CESAREAN SECTION     FRACTURE SURGERY Right    TUBAL LIGATION      Social History:  Ambulatory   independently       reports that she has been smoking cigarettes. She has a 50.00 pack-year smoking history. She has never used smokeless tobacco. She reports that she does not drink alcohol and does not use drugs.     Family History:   Family History  Problem Relation Age of Onset   Kyphosis Mother    Alcohol abuse Father    Liver disease Father    Kidney failure Father    Thyroid disease Maternal Grandmother    Breast cancer Paternal Grandmother    Heart disease Paternal Grandfather    Rheum arthritis Daughter    ______________________________________________________________________________________________ Allergies: Allergies  Allergen Reactions   Tetanus Toxoids Hives     Prior to Admission medications   Medication Sig Start Date End Date Taking? Authorizing Provider  amLODipine (NORVASC) 5 MG  tablet Take 1 tablet (5 mg total) by mouth daily. 06/06/17   Trinna Post, PA-C  Multiple Vitamin (MULTI-VITAMIN PO) Take by mouth.    [provider]    ___________________________________________________________________________________________________ Physical Exam: Vitals with BMI 02/17/2021 02/17/2021 02/17/2021  Height - - -  Weight - - -  BMI - - -  Systolic - 379 024  Diastolic - 90 84  Pulse 097 98 100     1. General:  in No  Acute distress    Chronically ill  -appearing 2. Psychological:  somnolent not Oriented 3. Head/ENT:    Dry Mucous Membranes  Head Non traumatic, neck supple                         Poor Dentition 4. SKIN: decreased Skin turgor,  Skin clean Dry and intact no rash 5. Heart: Regular rate and rhythm no  Murmur, no Rub or gallop 6. Lungs:  , no wheezes or crackles   7. Abdomen: Soft,  non-tender, Non distended   obese  bowel sounds present 8. Lower extremities: no clubbing, cyanosis, no  edema 9. Neurologically Grossly intact, moving all 4 extremities equally not following commands 10. MSK: Normal range of motion    Chart has been reviewed  ______________________________________________________________________________________________  Assessment/Plan 70 y.o. 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  Pharmacy consult. LP labs ordered .  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   Continue with acyclovir for at least 14 days patient will likely need PICC line Per neurology anticipate that with acyclovir she may improve   Tobacco abuse -  - Spoke about importance of quitting spent 5 minutes discussing options for treatment, prior attempts at quitting, and dangers of smoking  -At this point patient is   NOT  interested in quitting  - order nicotine patch   - nursing tobacco cessation protocol   Thyroid nodule -ordered thyroid ultrasound TSH T4 T3   Essential hypertension -resume Norvasc if able to tolerate   Obesity, Class III, BMI 40-49.9 (morbid obesity) ([place]) -we will need outpatient  follow-up with nutrition   Cardiomegaly - obtain echogramAvoid fluid overloadl    Prolonged QT interval - - will monitor on tele avoid QT prolonging medications, rehydrate correct electrolytes     Other plan as per orders.  DVT prophylaxis:  SCD       Code Status:    Code Status: Not on file FULL CODE   as per family  I had personally discussed CODE STATUS with family     Family Communication:   Family at  Bedside  plan of care was discussed  with   Daughter,   Disposition Plan:       To home once workup is complete and patient is stable   Following barriers for discharge:                            Electrolytes corrected                                                       white count improving able to transition to PO antibiotics                             Will need to be able to tolerate PO  Will need consultants to evaluate patient prior to discharge                       Would benefit from PT/OT eval prior to DC  Ordered                                        Consults called: Neurology is aware and following   Admission status:  ED Disposition     ED Disposition  [place]: [place] [[id number]]  Level of Care: Progressive [102]  Admit to  Progressive based on following criteria: NEUROLOGICAL AND NEUROSURGICAL complex patients with significant risk of instability, who do not meet ICU criteria, yet require close observation or frequent assessment (< / = every 2 - 4 hours) with medical / nursing intervention.  May admit patient to [facility] or [facility] if equivalent level of care is available:: No  Covid Evaluation: Asymptomatic Screening Protocol (No Symptoms)  Diagnosis: Herpes encephalitis [[id number]]  Admitting Physician: Toy Baker [[id number]]  Attending Physician: Toy Baker [[id number]]  Estimated length of stay: past midnight tomorrow  Certification:: I certify this patient will need inpatient services for at least 2 midnights             inpatient     I Expect 2 midnight stay secondary to severity of patient's current illness need for inpatient interventions justified by the following:  hemodynamic instability despite optimal treatment (tachycardia )  Severe lab/radiological/exam abnormalities including:    Abnormal MRI    That are currently affecting medical management.   I expect  patient to be hospitalized for 2 midnights requiring inpatient medical care.  Patient is at high risk for adverse outcome (such as loss of life or disability) if not treated.  Indication for inpatient stay as follows:  Severe change from baseline regarding mental status  Need for operative/procedural  intervention    Need for IV antibiotics, IV fluids,  IV antivirals    Level of care   progressive tele indefinitely please discontinue once patient no longer qualifies COVID-19 Labs    Lab Results  Component Value Date   [place] NEGATIVE 02/17/2021     Precautions: admitted as   Covid Negative     PPE: Used by the provider:   N95  eye Goggles,  Gloves     Kalliopi Coupland 02/18/2021, 2:05 AM    [facility]     after 2 AM please page floor coverage PA If 7AM-7PM, please contact the day  team taking care of the patient using [email or web address]   Patient was evaluated in the context of the global COVID-19 pandemic, which necessitated consideration that the patient might be at risk for infection with the SARS-CoV-2 virus that causes COVID-19. Institutional protocols and algorithms that pertain to the evaluation of patients at risk for COVID-19 are in a state of rapid change based on information released by regulatory bodies including the CDC and federal and state organizations. These policies and algorithms were followed during the patient's care.

## 2021-02-17 NOTE — Consult Note (Addendum)
Neurology Consultation  Reason for Consult: Acute confusion, nonverbal, does not follow commands Referring Physician: Dr. Tyrone Nine  CC: Patient is unable to provide a chief complaint due to nonverbal status  History is obtained from: EMS, Patient's daughter, chart review, unable to obtain from patient due to nonverbal state  HPI: Corona Popovich is a 70 y.o. 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 Patient's daughter states that she last had dinner last night with Ms. Choate around 6:00 PM in her usual state of health and went to work. When she returned this morning, she went to sleep and sent a text message to her mother this afternoon when she woke up around 3:00 PM. Her mother sent a response that did not make sense and when her daughter went upstairs to check on her, she found Ms. Hunsucker to be confused and unable to follow commands. Ms. Wattley lives with her daughter in the upper level of her house. She states that it took quite a while to get her mother down the stairs due to trouble following instructions and once they were down, she "almost passed out". EMS was subsequently activated. On arrival, she was found with an SpO2 of 85% on room air and her heart rate was as low as 42 en route to the hospital. She was activated as a Code Stroke for further evaluation.  LKW: 02/16/2021 18:00 TNK given?: no, patient is outside of the thrombolytic therapy window on presentation to the hospital  IR Thrombectomy? No, vessel imaging reviewed by attending neurologist without evidence of LVO Modified Rankin Scale: 0-Completely asymptomatic and back to baseline post- stroke  ROS: Unable to obtain due to altered mental status.   Past Medical History:  Diagnosis Date   Hernia,  hiatal    Hypertension    Past Surgical History:  Procedure Laterality Date   CESAREAN SECTION     FRACTURE SURGERY Right    TUBAL LIGATION     Family History  Problem Relation Age of Onset   Kyphosis Mother    Alcohol abuse Father    Liver disease Father    Kidney failure Father    Thyroid disease Maternal Grandmother    Breast cancer Paternal Grandmother    Heart disease Paternal Grandfather    Rheum arthritis Daughter    Social History:   reports that she has been smoking cigarettes. She has a 50.00 pack-year smoking history. She has never used smokeless tobacco. She reports that she does not drink alcohol and does not use drugs.  Medications  Current Facility-Administered Medications:    sodium chloride flush (NS) 0.9 % injection 3 mL, 3 mL, Intravenous, Once, Deno Etienne, DO  Current Outpatient Medications:    amLODipine (NORVASC) 5 MG tablet, Take 1 tablet (5 mg total) by mouth daily., Disp: 90 tablet, Rfl: 1   Multiple Vitamin (MULTI-VITAMIN PO), Take by mouth., Disp: , Rfl:   Exam: Current vital signs: Wt 114 kg   BMI 40.56 kg/m  Vital signs in last 24 hours: Weight:  [114 kg] 114 kg (11/09 1600)  GENERAL: Awake, alert, in no acute distress Psych: Affect appropriate for situation, patient is overall calm and cooperative though occasionally resistant for example for MRI scan Head: Normocephalic and atraumatic, without obvious abnormality EENT: Normal conjunctivae, dry mucous membranes, no OP obstruction LUNGS: Normal  respiratory effort. Non-labored breathing on room air CV: Regular rate and rhythm on telemetry, no bradycardia witnessed during our evaluation ABDOMEN: Soft, non-tender, non-distended Extremities: warm, well perfused, without obvious deformity  NEURO:  Mental Status: Awake, alert, but without any verbal output.  At best she nods yes and no to questions but not accurately.  She does mimic well Cranial Nerves:  II: PERRLA and brisk. visual fields  full to confrontation.  III, IV, VI: EOMI to tracking examiner's face V: Sensation is intact to eyelash brush VII: Face is symmetric resting and smiling.   VIII: Hearing intact to voice XII: Tongue protrudes midline without fasciculations.   Remainder difficult to test given her aphasia Motor: Confrontational testing is challenging given her aphasia.  However she has grossly equal strength in all 4 extremities and does not appear significantly weak anywhere Sensation: Equally reactive to light noxious stimulation in all 4 extremities with no apparent extinction to double simultaneous stimuli Coordination: Intact on casual observation, she has difficulty following these commands DTRs: 2+ throughout.  Gait: Deferred  NIHSS: 1a Level of Conscious.: 0 1b LOC Questions: 2 1c LOC Commands: 2 2 Best Gaze: 0 3 Visual: 0 4 Facial Palsy: 0 5a Motor Arm - left: 0 5b Motor Arm - Right: 0 6a Motor Leg - Left: 0 6b Motor Leg - Right: 0 7 Limb Ataxia: 0 8 Sensory: 0 9 Best Language: 3 10 Dysarthria: 2 11 Extinct. and Inatten.: 0 TOTAL: 9  Labs I have reviewed labs in epic and the results pertinent to this consultation are: CBC    Component Value Date/Time   WBC 13.4 (H) 02/17/2021 1638   RBC 5.26 (H) 02/17/2021 1638   HGB 15.6 (H) 02/17/2021 1643   HGB 14.9 09/10/2018 1159   HCT 46.0 02/17/2021 1643   HCT 45.1 09/10/2018 1159   PLT 185 02/17/2021 1638   PLT 185 09/10/2018 1159   MCV 87.6 02/17/2021 1638   MCV 85 09/10/2018 1159   MCV 87 12/16/2011 1212   MCH 28.3 02/17/2021 1638   MCHC 32.3 02/17/2021 1638   RDW 14.2 02/17/2021 1638   RDW 13.6 09/10/2018 1159   RDW 13.7 12/16/2011 1212   LYMPHSABS 2.1 02/17/2021 1638   LYMPHSABS 1.8 09/10/2018 1159   MONOABS 0.6 02/17/2021 1638   EOSABS 0.2 02/17/2021 1638   EOSABS 0.3 09/10/2018 1159   BASOSABS 0.1 02/17/2021 1638   BASOSABS 0.1 09/10/2018 1159   CMP     Component Value Date/Time   NA 141 02/17/2021 1643   NA 146 (H)  09/10/2018 1159   NA 143 12/16/2011 1212   K 3.6 02/17/2021 1643   K 4.0 12/16/2011 1212   CL 105 02/17/2021 1643   CL 109 (H) 12/16/2011 1212   CO2 21 09/10/2018 1159   CO2 28 12/16/2011 1212   GLUCOSE 194 (H) 02/17/2021 1643   GLUCOSE 97 12/16/2011 1212   BUN 16 02/17/2021 1643   BUN 12 09/10/2018 1159   BUN 14 12/16/2011 1212   CREATININE 0.90 02/17/2021 1643   CREATININE 0.83 12/16/2011 1212   CALCIUM 9.4 09/10/2018 1159   CALCIUM 8.5 12/16/2011 1212   PROT 6.5 09/10/2018 1159   PROT 6.9 12/16/2011 1212   ALBUMIN 4.3 09/10/2018 1159   ALBUMIN 3.5 12/16/2011 1212   AST 21 09/10/2018 1159   AST 16 12/16/2011 1212   ALT 16 09/10/2018 1159   ALT 15 12/16/2011 1212   ALKPHOS 122 (H) 09/10/2018 1159   ALKPHOS 115 12/16/2011 1212  BILITOT 0.4 09/10/2018 1159   BILITOT 0.4 12/16/2011 1212   GFRNONAA 70 09/10/2018 1159   GFRNONAA >60 12/16/2011 1212   GFRAA 81 09/10/2018 1159   GFRAA >60 12/16/2011 1212   Lipid Panel     Component Value Date/Time   CHOL 131 09/10/2018 1159   TRIG 93 09/10/2018 1159   HDL 48 09/10/2018 1159   CHOLHDL 2.7 09/10/2018 1159   LDLCALC 64 09/10/2018 1159   Lab Results  Component Value Date   HGBA1C 5.8 (H) 09/10/2018   Imaging I have reviewed the images obtained:  CT-scan of the brain 11/9: 1. No acute finding by CT. Mild chronic small-vessel ischemic change of the cerebral hemispheric white matter and left basal ganglia. 2. These results were communicated to Dr. Curly Shores at 4:49 pm on 02/17/2021 by text page via the [facility] messaging system. 3. ASPECTS is 10  CT angio head and neck with CT cerebral perfusion 11/9: 1. No intracranial large vessel occlusion or hemodynamically significant stenosis. 2. No hemodynamically significant stenosis in the neck. 3. CT perfusion demonstrates no infarct core or penumbra. 4. Incidental note is made of hypoattenuating lesion in the thyroid isthmus, measuring up to 2.8 cm. If this has not previously been  evaluated, an ultrasound of the thyroid is recommended.  MRI Brain  Restricted diffusion and increased T2 signal, most prominent in the bilateral anterior temporal lobes, left greater than right medial temporal lobes, and left temporoparietal region, which is nonspecific but could be seen in the setting of viral or autoimmune encephalitis. Seizures can also appears similar.  Assessment: 70 y.o. female who presented to the ED via EMS for evaluation of altered mental status with confusion, difficulty following commands, and nonverbal state after being found by her daughter altered this afternoon.  - Examination reveals patient with global aphasia who is nonverbal, confused, and does not follow commands.  - CT imaging was obtained without evidence of an acute intracranial abnormality. CT angiography imaging was obtained without evidence of LVO or hemodynamically significant stenosis of the head or neck. Perfusion imaging is without core infarct or penumbra. Due to ongoing concern for stroke not identified on CT imaging with diffuse aphasia, patient was taken to MRI imaging. MRI revealed bilateral temporal lobe diffusion worse on the left than the right, with matched FLAIR changes suggestive of onset least 6 hours prior to admission in a pattern very atypical for vascular process, but highly concerning for an infectious/inflammatory etiology  I am most concerned about the possibility of an acute viral meningitis, particularly herpetic meningitis given the imaging findings.  While I have low overall concern for bacterial meningitis, given that the patient seems quite alert and comfortable, given that CSF could not be obtained on a bedside attempt, will empirically cover overnight.  Recommendations: - LP with CSF studies: cell count with differential, protein, glucose, gram stain, HSV PCR, CMV IgM, EBV, cryptococcal antigen, oligoclonal bands, autoimmune encephalitis [facility] send out, [facility] labs HSV IgG CSF and  VZV IgG - Serum autoimmune encephalitis send out to [facility]  - MRI brain with contrast when able to obtain - LTM EEG  - Initiating Keppra 1000 mg twice daily given preliminary read of left temporal PLEDs on EEG - Empiric acyclovir for meningitis coverage pending HSV PCR results - Empiric bacterial meningitis coverage as well - Neurology will continue to follow  Pt seen by NP/Neuro and later by MD. Note/plan to be edited by MD as needed.  Anibal Henderson, AGAC-NP [facility] Pager: [phone]  Attending Neurologist's note:  I personally saw this patient, gathering history, performing a full neurologic examination, reviewing relevant labs, personally reviewing relevant imaging including head CT, CTA, CT perfusion and MRI brain, and formulated the assessment and plan, adding the note above for completeness and clarity to accurately reflect my thoughts  Lesleigh Noe MD-PhD [facility] [phone]  Available 7 AM to 7 PM, outside these hours please contact Neurologist on call listed on [place] Performed by: Lorenza Chick   Total critical care time: 70 minutes  Critical care time was exclusive of separately billable procedures and treating other patients.  Critical care was necessary to treat or prevent imminent or life-threatening deterioration, in particular the patient presented with acute neurological changes in the time window potentially compatible with thrombolytic treatment and requiring emergent highly complex medical decision making.  Critical care was time spent personally by me on the following activities: development of treatment plan with patient and/or surrogate as well as nursing, discussions with consultants, evaluation of patient's response to treatment, examination of patient, obtaining history from patient or surrogate, ordering and performing treatments and interventions, ordering and review of laboratory studies, ordering  and review of radiographic studies, pulse oximetry and re-evaluation of patient's condition.

## 2021-02-17 NOTE — ED Notes (Signed)
Pt unable to have MRI tonight due to EEG.  MRI notified.

## 2021-02-17 NOTE — Progress Notes (Signed)
STAT EEG complete - results pending. ? ?

## 2021-02-17 NOTE — Procedures (Addendum)
Patient Name: Robin Adams  MRN: [id number]  Epilepsy Attending: Lora Havens  Referring Physician/Provider: Dr Lesleigh Noe Date: 02/17/2021 Duration: 29.16 mins  Patient history: 70 y.o. female who presented to the ED via EMS for evaluation of altered mental status with confusion, difficulty following commands, and nonverbal state after being found by her daughter altered this afternoon. EEG to evaluate for seizure  Level of alertness: Awake  AEDs during EEG study: None  Technical aspects: This EEG study was done with scalp electrodes positioned according to the 10-20 International system of electrode placement. Electrical activity was acquired at a sampling rate of 500Hz  and reviewed with a high frequency filter of 70Hz  and a low frequency filter of 1Hz . EEG data were recorded continuously and digitally stored.   Description: The posterior dominant rhythm consists of 8 Hz activity of moderate voltage (25-35 uV) seen predominantly in posterior head regions, asymmetric ( left< right) and reactive to eye opening and eye closing. EEG showed continuous 3 to 6 Hz theta-delta slowing in left hemisphere, maximal left frontotemporal region. Lateralized periodic discharges were also noted in left hemisphere, maximal left frontotemporal region with fluctuation frequency between 0.5-1hz . Hyperventilation and photic stimulation were not performed.     ABNORMALITY - Lateralized periodic discharges, left hemisphere, maximal left frontotemporal region - Continuous slow, left hemisphere, maximal left frontotemporal region - background asymmetry, left<right  IMPRESSION: This study showed evidence of epileptogenicity and cortical dysfunction arising from left hemisphere, maximal left frontotemporal region likely secondary to underlying structural abnormality. This eeg pattern is on the ictal -interictal continuum with high potential for seizures. No definite seizures were seen throughout the  recording.  Dr Curly Shores was notified.   Robin Adams

## 2021-02-17 NOTE — Progress Notes (Signed)
Pharmacy Antibiotic Note  Trilby Way is a 70 y.o. female admitted on 02/17/2021 with CNS infection. Initially presented as Code Stroke with LKW 11/8 @ 1800. WBC 13.4 on admission. Initial broad coverage with potential for meningitis including ampicillin 2g q4h and ceftriaxone 2g q12h. Pharmacy has been consulted for Vancomycin and acyclovir dosing.  Plan: Acyclovir 810mg  q8h with continuous NS @75mL /hr  Vancomycin 2250 mg x 1 for load Vancomycin 750mg  q12h (nomogram-based dosing) Obtain vancomycin trough as needed F/u renal function and labs for de-esculations  Height: 5\' 6"  (167.6 cm) Weight: 114 kg (251 lb 5.2 oz) IBW/kg (Calculated) : 59.3  No data recorded.  Recent Labs  Lab 02/17/21 1638 02/17/21 1643  WBC 13.4*  --   CREATININE 0.96 0.90    Estimated Creatinine Clearance: 76.7 mL/min (by C-G formula based on SCr of 0.9 mg/dL).    Allergies  Allergen Reactions   Tetanus Toxoids Hives    Antimicrobials this admission: Acyclovir 11/9  > Ampicillin 11/9  > Ceftriaxone 11/9  > Vancomycin 11/9  >  Microbiology results: 11/9 CSF culture: sent  Thank you for allowing pharmacy to be a part of this patient's care.  Levonne Spiller 02/17/2021 8:56 PM

## 2021-02-17 NOTE — Code Documentation (Signed)
Stroke Response Nurse Documentation Code Documentation  Robin Adams is a 70 y.o. female arriving to [facility] ED via [facility] on 02/17/21 with past medical hx hypertension. Code stroke was activated by EMS.  Patient from home where she lives with her daughter. She was LKW yesterday 11/8 at 1800. Her daughter noted she was acting strange via text. EMS reported in and out of consciousness and aphasic.   Stroke team at the bedside on patient arrival. Labs drawn. Patient to CT with team. NIHSS 8, see documentation for details and code stroke times. Patient with disoriented, not following commands, Global aphasia , and dysarthria  on exam. The following imaging was completed: CT, CTA head and neck.. Patient is not a candidate for IV Thrombolytic due to out of the window. Patient is not a candidate for IR due to no LVO.   Care/Plan: Q2 VS and neuro checks.   Bedside handoff with ED RN Candace.    Earma Reading  Stroke Response RN

## 2021-02-17 NOTE — Progress Notes (Signed)
LTM EEG hooked up and running - no initial skin breakdown - push button tested - neuro notified.  

## 2021-02-17 NOTE — ED Provider Notes (Signed)
[facility] EMERGENCY DEPARTMENT Provider Note   CSN: [id number] Arrival date & time: 02/17/21  1637     History Chief Complaint  Patient presents with   Code Stroke    Robin Adams is a 70 y.o. female.  70 yo F with a chief complaint of altered mental status.  The patient lives with her daughter and her daughter try to get in touch with her about 3 PM today and was having some difficulty.  Eventually her mother showed up at the top of the stairs and seemed confused was asked to come down and she went back into her room multiple times.  Eventually got her to come downstairs and realized that she was having trouble speaking.  Called 911 and brought her here.  Denies any issues yesterday.  Denies any complaints of headache or fatigue or cough or shortness of breath.  Last seen normal last night at dinner.  Arrived as a code stroke.  Level 5 caveat altered mental status.  The history is provided by the patient.  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   Hyperglycemia 02/72/5366   Acute metabolic encephalopathy 44/06/4740   Tobacco abuse 02/17/2021   Thyroid nodule 02/17/2021   Essential hypertension 02/17/2021   Obesity, Class III, BMI 40-49.9 (morbid obesity) ([place]) 02/17/2021   Cardiomegaly 02/17/2021   Prolonged QT interval 02/17/2021   Ovarian cyst 10/17/2018   Thickened endometrium 10/17/2018    Past Surgical History:  Procedure Laterality Date   CESAREAN SECTION     FRACTURE SURGERY Right    TUBAL LIGATION       OB History      Gravida  3   Para  3   Term  3   Preterm      AB      Living  3      SAB      IAB      Ectopic      Multiple      Live Births  3           Family History  Problem Relation Age of Onset   Kyphosis Mother    Alcohol abuse Father    Liver disease Father    Kidney failure Father    Thyroid disease Maternal Grandmother    Breast cancer Paternal Grandmother    Heart disease Paternal Grandfather    Rheum arthritis Daughter     Social History   Tobacco Use   Smoking status: Every Day    Packs/day: 1.00    Years: 50.00    Pack years: 50.00    Types: Cigarettes   Smokeless tobacco: Never  Vaping Use   Vaping Use: Never used  Substance Use Topics   Alcohol use: No   Drug use: No    Home Medications Prior to Admission medications   Medication Sig Start Date End Date Taking? Authorizing Provider  acetaminophen (TYLENOL) 500 MG tablet Take 1,000 mg by mouth every 6 (six) hours as needed for moderate pain or headache.  Yes [provider]  ibuprofen (ADVIL) 200 MG tablet Take 400 mg by mouth every 6 (six) hours as needed for headache or moderate pain.   Yes [provider]  Multiple Vitamin (MULTI-VITAMIN PO) Take 1 tablet by mouth daily.   Yes [provider]  amLODipine (NORVASC) 5 MG tablet Take 1 tablet (5 mg total) by mouth daily. Patient not taking: No sig reported 06/06/17   Trinna Post, PA-C    Allergies    Penicillins and Tetanus toxoids  Review of Systems   Review of Systems  Unable to perform ROS: Mental status change  Constitutional:  Negative for chills and fever.  HENT:  Negative for congestion and rhinorrhea.   Eyes:  Negative for redness and visual disturbance.  Respiratory:  Negative for shortness of breath and wheezing.   Cardiovascular:  Negative for chest pain and palpitations.  Gastrointestinal:  Negative for nausea and vomiting.  Genitourinary:  Negative for dysuria and urgency.   Musculoskeletal:  Negative for arthralgias and myalgias.  Skin:  Negative for pallor and wound.  Neurological:  Negative for dizziness and headaches.   Physical Exam Updated Vital Signs BP (!) 155/90   Pulse 98   Resp 13   Ht 5\' 6"  (1.676 m)   Wt 114 kg   SpO2 100%   BMI 40.56 kg/m   Physical Exam Vitals and nursing note reviewed.  Constitutional:      General: She is not in acute distress.    Appearance: She is well-developed. She is not diaphoretic.  HENT:     Head: Normocephalic and atraumatic.  Eyes:     Pupils: Pupils are equal, round, and reactive to light.  Cardiovascular:     Rate and Rhythm: Normal rate and regular rhythm.     Heart sounds: No murmur heard.   No friction rub. No gallop.  Pulmonary:     Effort: Pulmonary effort is normal.     Breath sounds: No wheezing or rales.  Abdominal:     General: There is no distension.     Palpations: Abdomen is soft.     Tenderness: There is no abdominal tenderness.  Musculoskeletal:        General: No tenderness.     Cervical back: Normal range of motion and neck supple.  Skin:    General: Skin is warm and dry.  Neurological:     Mental Status: She is alert.     Comments: Mumbles.  Moving all 4 extremities spontaneously.  Psychiatric:        Behavior: Behavior normal.    ED Results / Procedures / Treatments   Labs (all labs ordered are listed, but only abnormal results are displayed) Labs Reviewed  CBC - Abnormal; Notable for the following components:      Result Value   WBC 13.4 (*)    RBC 5.26 (*)    HCT 46.1 (*)    All other components within normal limits  DIFFERENTIAL - Abnormal; Notable for the following components:   Neutro Abs 10.1 (*)    Abs Immature Granulocytes 0.25 (*)    All other components within normal limits  COMPREHENSIVE METABOLIC PANEL - Abnormal; Notable for the following components:   Glucose, Bld 201 (*)    Calcium 8.7 (*)    Albumin 3.3 (*)    All other components within  normal limits  CBG MONITORING, ED - Abnormal; Notable for the following components:   Glucose-Capillary 195 (*)    All other  components within normal limits  I-STAT CHEM 8, ED - Abnormal; Notable for the following components:   Glucose, Bld 194 (*)    Calcium, Ion 1.13 (*)    Hemoglobin 15.6 (*)    All other components within normal limits  I-STAT VENOUS BLOOD GAS, ED - Abnormal; Notable for the following components:   pO2, Ven 121.0 (*)    Calcium, Ion 1.12 (*)    Hemoglobin 15.3 (*)    All other components within normal limits  RESP PANEL BY RT-PCR (FLU A&B, COVID) ARPGX2  CSF CULTURE W GRAM STAIN  PROTIME-INR  APTT  CSF CELL COUNT WITH DIFFERENTIAL  CSF CELL COUNT WITH DIFFERENTIAL  PROTEIN AND GLUCOSE, CSF  HIV ANTIBODY (ROUTINE TESTING W REFLEX)  OLIGOCLONAL BANDS, CSF + SERM  DRAW EXTRA CLOT TUBE  CRYPTOCOCCAL ANTIGEN, CSF  HSV 1/2 PCR, CSF  MISC [facility] TEST (SEND OUT)  MISC [facility] TEST (SEND OUT)  HSV(HERPES SIMPLEX VRS) I + II AB-IGG  EPSTEIN-BARR VIRUS VCA, IGG  EPSTEIN-BARR VIRUS VCA, IGM  RSV(RESPIRATORY SYNCYTIAL VIRUS) AB, BLOOD  CMV IGM  MISC [facility] TEST (SEND OUT)  MISC [facility] TEST (SEND OUT)  URINALYSIS, ROUTINE W REFLEX MICROSCOPIC  TSH  T4, FREE  T3  MAGNESIUM  HEMOGLOBIN A1C  BRAIN NATRIURETIC PEPTIDE  PREALBUMIN  BASIC METABOLIC PANEL  CBG MONITORING, ED  CYTOLOGY - NON PAP    EKG None  Radiology MR BRAIN WO CONTRAST  Result Date: 02/17/2021 CLINICAL DATA:  Neuro deficit, acute, stroke suspected EXAM: MRI HEAD WITHOUT CONTRAST TECHNIQUE: Multiplanar, multiecho pulse sequences of the brain and surrounding structures were obtained without intravenous contrast. COMPARISON:  No prior MRI, correlation made with CT head 02/17/2021. FINDINGS: Brain: Restricted diffusion with ADC correlate, most prominent in the in the bilateral anterior temporal lobes, left greater than right medial temporal lobes, and left temporoparietal region. These areas are  associated with increased T2 signal and gyral swelling. No hemorrhage, mass, mass effect, or midline shift. T2 hyperintense signal in the periventricular white matter, likely the sequela of chronic small vessel ischemic disease. Vascular: Normal flow voids. Skull and upper cervical spine: Normal marrow signal. Sinuses/Orbits: Negative. Other: The mastoids are well aerated. IMPRESSION: Restricted diffusion and increased T2 signal, most prominent in the bilateral anterior temporal lobes, left greater than right medial temporal lobes, and left temporoparietal region, which is nonspecific but could be seen in the setting of viral or autoimmune encephalitis. Seizures can also appears similar. Electronically Signed   By: Merilyn Baba M.D.   On: 02/17/2021 18:13   DG Chest Port 1 View  Result Date: 02/17/2021 CLINICAL DATA:  Shortness of breath EXAM: PORTABLE CHEST 1 VIEW COMPARISON:  Portable exam 1823 hours compared to 01/02/2017 FINDINGS: Enlargement of cardiac silhouette with pulmonary vascular congestion. Decreased lung volumes versus previous exam with mild bibasilar atelectasis. Scattered interstitial prominence, question mild pulmonary edema. No pleural effusion or pneumothorax. Bones demineralized. IMPRESSION: Enlargement of cardiac silhouette with pulmonary vascular congestion and interstitial prominence question pulmonary edema. Bibasilar atelectasis. Electronically Signed   By: Lavonia Dana M.D.   On: 02/17/2021 18:35   CT HEAD CODE STROKE WO CONTRAST  Result Date: 02/17/2021 CLINICAL DATA:  Code stroke. Neuro deficit, acute, stroke suspected. Confusion. EXAM: CT HEAD WITHOUT CONTRAST TECHNIQUE: Contiguous axial images were obtained from the base of the skull through the vertex without intravenous contrast. COMPARISON:  12/16/2011 FINDINGS: Brain: No acute finding by CT. Mild chronic small-vessel ischemic changes of the white matter in left basal ganglia.  No identifiable acute infarction, mass lesion,  hemorrhage, hydrocephalus or extra-axial collection. Vascular: There is atherosclerotic calcification of the major vessels at the base of the brain. Skull: Negative Sinuses/Orbits: Clear/normal Other: None ASPECTS ([place] Stroke Program Early CT Score) - Ganglionic level infarction (caudate, lentiform nuclei, internal capsule, insula, M1-M3 cortex): 7 - Supraganglionic infarction (M4-M6 cortex): 3 Total score (0-10 with 10 being normal): 10 IMPRESSION: 1. No acute finding by CT. Mild chronic small-vessel ischemic change of the cerebral hemispheric white matter and left basal ganglia. 2. These results were communicated to Dr. Curly Shores at 4:49 pm on 28-Feb-2021 by text page via the [facility] messaging system. 3. ASPECTS is 10 Electronically Signed   By: Nelson Chimes M.D.   On: 02/28/21 16:50   CT ANGIO HEAD NECK W WO CM W PERF (CODE STROKE)  Result Date: 2021-02-28 CLINICAL DATA:  Aphasia, confusion, stroke suspected EXAM: CT ANGIOGRAPHY HEAD AND NECK CT PERFUSION BRAIN TECHNIQUE: Multidetector CT imaging of the head and neck was performed using the standard protocol during bolus administration of intravenous contrast. Multiplanar CT image reconstructions and MIPs were obtained to evaluate the vascular anatomy. Carotid stenosis measurements (when applicable) are obtained utilizing NASCET criteria, using the distal internal carotid diameter as the denominator. Multiphase CT imaging of the brain was performed following IV bolus contrast injection. Subsequent parametric perfusion maps were calculated using RAPID software. CONTRAST:  155mL OMNIPAQUE IOHEXOL 350 MG/ML SOLN COMPARISON:  28-Feb-2021 CT head codes stroke, 12/27/2011 CT head. FINDINGS: CT HEAD FINDINGS Please see same day CT head code stroke for noncontrast findings. CTA NECK FINDINGS Aortic arch: Standard branching. Imaged portion shows no evidence of aneurysm or dissection. No significant stenosis of the major arch vessel origins. Right carotid system: No  evidence of dissection, stenosis (50% or greater) or occlusion. Left carotid system: No evidence of dissection, stenosis (50% or greater) or occlusion. Vertebral arteries: Codominant. No evidence of dissection, stenosis (50% or greater) or occlusion. Skeleton: No acute osseous abnormality. Other neck: Hypoattenuating lesion in the thyroid isthmus measures up to 1.5 x 1.9 x 2.8 cm. The neck is otherwise negative. Upper chest: Dependent atelectasis. No pleural effusion or focal pulmonary opacity. Review of the MIP images confirms the above findings CTA HEAD FINDINGS Anterior circulation: Both internal carotid arteries are patent to the termini, without stenosis or other abnormality. A1 segments patent. Normal anterior communicating artery. Anterior cerebral arteries are patent to their distal aspects. No M1 stenosis or occlusion. Normal MCA bifurcations. Distal MCA branches perfused and symmetric. Posterior circulation: Vertebral arteries widely patent to the vertebrobasilar junction without stenosis. Posterior inferior cerebral arteries patent bilaterally. Basilar patent to its distal aspect. Superior cerebral arteries patent bilaterally. Bilateral posterior communicating arteries are visualized. Diminutive right P1, with near fetal origin of the right PCA. Normal left P1. PCAs well perfused to their distal aspects without stenosis. Venous sinuses: As permitted by contrast timing, patent. Anatomic variants: None significant Review of the MIP images confirms the above findings CT Brain Perfusion Findings: ASPECTS: 10 CBF (<30%) Volume: 66mL Perfusion (Tmax>6.0s) volume: 46mL Mismatch Volume: 53mL Infarction Location:None IMPRESSION: 1. No intracranial large vessel occlusion or hemodynamically significant stenosis. 2. No hemodynamically significant stenosis in the neck. 3. CT perfusion demonstrates no infarct core or penumbra. 4. Incidental note is made of hypoattenuating lesion in the thyroid isthmus, measuring up to 2.8  cm. If this has not previously been evaluated, an ultrasound of the thyroid is recommended. Electronically Signed   By: Merilyn Baba M.D.   On:  02/17/2021 17:17    Procedures .Lumbar Puncture  Date/Time: 02/17/2021 7:58 PM Performed by: Deno Etienne, DO Authorized by: Deno Etienne, DO   Consent:    Consent obtained:  Verbal   Consent given by: daughter.   Risks, benefits, and alternatives were discussed: yes     Risks discussed:  Bleeding, infection, headache, nerve damage, pain and repeat procedure   Alternatives discussed:  No treatment, delayed treatment and alternative treatment Universal protocol:    Procedure explained and questions answered to patient or proxy's satisfaction: yes     Site/side marked: yes     Patient identity confirmed:  Arm band Pre-procedure details:    Procedure purpose:  Diagnostic   Preparation: Patient was prepped and draped in usual sterile fashion   Anesthesia:    Anesthesia method:  Local infiltration   Local anesthetic:  Lidocaine 2% WITH epi Procedure details:    Lumbar space:  L3-L4 interspace   Patient position:  L lateral decubitus   Needle gauge:  18   Needle type:  Spinal needle - Quincke tip   Needle length (in): 6.   Ultrasound guidance: no     Number of attempts:  5 or more   Total volume (ml):  0 Post-procedure details:    Puncture site:  Adhesive bandage applied   Procedure completion:  Tolerated well, no immediate complications   Medications Ordered in ED Medications  sodium chloride flush (NS) 0.9 % injection 3 mL (3 mLs Intravenous Not Given 02/17/21 1820)  acyclovir (ZOVIRAX) 810 mg in dextrose 5 % 150 mL IVPB (0 mg Intravenous Stopped 02/17/21 2016)  ampicillin (OMNIPEN) 2 g in sodium chloride 0.9 % 100 mL IVPB (has no administration in time range)  cefTRIAXone (ROCEPHIN) 2 g in sodium chloride 0.9 % 100 mL IVPB (0 g Intravenous Stopped 02/17/21 2129)  vancomycin (VANCOCIN) 2,250 mg in sodium chloride 0.9 % 500 mL IVPB (has no  administration in time range)  levETIRAcetam (KEPPRA) IVPB 1000 mg/100 mL premix (1,000 mg Intravenous New Bag/Given 02/17/21 2220)  levETIRAcetam (KEPPRA) IVPB 1000 mg/100 mL premix (1,000 mg Intravenous Not Given 02/17/21 2222)  insulin aspart (novoLOG) injection 0-9 Units (has no administration in time range)  0.9 %  sodium chloride infusion (has no administration in time range)  iohexol (OMNIPAQUE) 350 MG/ML injection 100 mL (100 mLs Intravenous Contrast Given 02/17/21 1705)  LORazepam (ATIVAN) injection 2 mg (2 mg Intravenous Given 02/17/21 1738)  midazolam (VERSED) injection 2 mg ( Intravenous Given 02/17/21 1928)    ED Course  I have reviewed the triage vital signs and the nursing notes.  Pertinent labs & imaging results that were available during my care of the patient were reviewed by me and considered in my medical decision making (see chart for details).    MDM Rules/Calculators/A&P                           70 yo F with a chief complaint of altered mental status.  Found to have occurred acutely.  Patient arrived as a code stroke.  MRI concerning for bitemporal inflammation.  Concern for possible HSV encephalitis.  Neurology recommending LP viral Acyclovir medical admission.  Of note the patient was also hypoxic.  Not on oxygen at baseline.  Chest x-ray without obvious focal infiltrate.  Unfortunately unable to obtain LP at bedside due to body habitus and difficulty palpating landmarks.  Notified the neurologist.  Will discuss with medicine for admission.  The patients results and plan were reviewed and discussed.   Any x-rays performed were independently reviewed by myself.   Differential diagnosis were considered with the presenting HPI.  Medications  sodium chloride flush (NS) 0.9 % injection 3 mL (3 mLs Intravenous Not Given 02/17/21 1820)  acyclovir (ZOVIRAX) 810 mg in dextrose 5 % 150 mL IVPB (0 mg Intravenous Stopped 02/17/21 2016)  ampicillin (OMNIPEN) 2 g in sodium  chloride 0.9 % 100 mL IVPB (has no administration in time range)  cefTRIAXone (ROCEPHIN) 2 g in sodium chloride 0.9 % 100 mL IVPB (0 g Intravenous Stopped 02/17/21 2129)  vancomycin (VANCOCIN) 2,250 mg in sodium chloride 0.9 % 500 mL IVPB (has no administration in time range)  levETIRAcetam (KEPPRA) IVPB 1000 mg/100 mL premix (1,000 mg Intravenous New Bag/Given 02/17/21 2220)  levETIRAcetam (KEPPRA) IVPB 1000 mg/100 mL premix (1,000 mg Intravenous Not Given 02/17/21 2222)  insulin aspart (novoLOG) injection 0-9 Units (has no administration in time range)  0.9 %  sodium chloride infusion (has no administration in time range)  iohexol (OMNIPAQUE) 350 MG/ML injection 100 mL (100 mLs Intravenous Contrast Given 02/17/21 1705)  LORazepam (ATIVAN) injection 2 mg (2 mg Intravenous Given 02/17/21 1738)  midazolam (VERSED) injection 2 mg ( Intravenous Given 02/17/21 1928)    Vitals:   02/17/21 1930 02/17/21 1945 02/17/21 2000 02/17/21 2015  BP: (!) 143/84 (!) 155/90    Pulse: 100 98 (!) 101 98  Resp:  15 20 13   SpO2: 98% 100% 98% 100%  Weight:      Height:        Final diagnoses:  Herpes encephalitis  Thyroid lesion    Admission/ observation were discussed with the admitting physician, patient and/or family and they are comfortable with the plan.    Final Clinical Impression(s) / ED Diagnoses Final diagnoses:  Herpes encephalitis  Thyroid lesion    Rx / DC Orders ED Discharge Orders     None        Deno Etienne, DO 02/17/21 2338

## 2021-02-17 NOTE — ED Triage Notes (Signed)
Pt's daughter works nights and last saw her mom last night about 1800  When daughter woke around 3 mom was altered and unable to communicate with her.  EMS noted sinus brady as well as sats in the high 70s which improved on 4L [place].

## 2021-02-18 ENCOUNTER — Inpatient Hospital Stay (HOSPITAL_COMMUNITY): Payer: Medicare Other

## 2021-02-18 DIAGNOSIS — I10 Essential (primary) hypertension: Secondary | ICD-10-CM | POA: Diagnosis not present

## 2021-02-18 DIAGNOSIS — R4182 Altered mental status, unspecified: Secondary | ICD-10-CM

## 2021-02-18 DIAGNOSIS — G0490 Encephalitis and encephalomyelitis, unspecified: Secondary | ICD-10-CM | POA: Diagnosis not present

## 2021-02-18 DIAGNOSIS — G9341 Metabolic encephalopathy: Secondary | ICD-10-CM | POA: Diagnosis not present

## 2021-02-18 DIAGNOSIS — I517 Cardiomegaly: Secondary | ICD-10-CM | POA: Diagnosis not present

## 2021-02-18 DIAGNOSIS — B004 Herpesviral encephalitis: Secondary | ICD-10-CM | POA: Diagnosis not present

## 2021-02-18 DIAGNOSIS — E041 Nontoxic single thyroid nodule: Secondary | ICD-10-CM | POA: Diagnosis not present

## 2021-02-18 LAB — CBC WITH DIFFERENTIAL/PLATELET
Abs Immature Granulocytes: 0.12 10*3/uL — ABNORMAL HIGH (ref 0.00–0.07)
Basophils Absolute: 0 10*3/uL (ref 0.0–0.1)
Basophils Relative: 0 %
Eosinophils Absolute: 0 10*3/uL (ref 0.0–0.5)
Eosinophils Relative: 0 %
HCT: 43.4 % (ref 36.0–46.0)
Hemoglobin: 13.8 g/dL (ref 12.0–15.0)
Immature Granulocytes: 1 %
Lymphocytes Relative: 7 %
Lymphs Abs: 1.3 10*3/uL (ref 0.7–4.0)
MCH: 27.9 pg (ref 26.0–34.0)
MCHC: 31.8 g/dL (ref 30.0–36.0)
MCV: 87.9 fL (ref 80.0–100.0)
Monocytes Absolute: 0.7 10*3/uL (ref 0.1–1.0)
Monocytes Relative: 4 %
Neutro Abs: 15.3 10*3/uL — ABNORMAL HIGH (ref 1.7–7.7)
Neutrophils Relative %: 88 %
Platelets: 191 10*3/uL (ref 150–400)
RBC: 4.94 MIL/uL (ref 3.87–5.11)
RDW: 14.3 % (ref 11.5–15.5)
WBC: 17.3 10*3/uL — ABNORMAL HIGH (ref 4.0–10.5)
nRBC: 0 % (ref 0.0–0.2)

## 2021-02-18 LAB — GLUCOSE, CAPILLARY
Glucose-Capillary: 105 mg/dL — ABNORMAL HIGH (ref 70–99)
Glucose-Capillary: 115 mg/dL — ABNORMAL HIGH (ref 70–99)
Glucose-Capillary: 115 mg/dL — ABNORMAL HIGH (ref 70–99)
Glucose-Capillary: 133 mg/dL — ABNORMAL HIGH (ref 70–99)
Glucose-Capillary: 95 mg/dL (ref 70–99)
Glucose-Capillary: 95 mg/dL (ref 70–99)
Glucose-Capillary: 98 mg/dL (ref 70–99)

## 2021-02-18 LAB — ECHOCARDIOGRAM COMPLETE
AR max vel: 1.76 cm2
AV Peak grad: 12.8 mmHg
Ao pk vel: 1.79 m/s
Area-P 1/2: 4.68 cm2
Calc EF: 57.4 %
Height: 66 in
S' Lateral: 3.6 cm
Single Plane A2C EF: 52.5 %
Single Plane A4C EF: 58.8 %
Weight: 4021.19 oz

## 2021-02-18 LAB — COMPREHENSIVE METABOLIC PANEL
ALT: 18 U/L (ref 0–44)
AST: 17 U/L (ref 15–41)
Albumin: 3.1 g/dL — ABNORMAL LOW (ref 3.5–5.0)
Alkaline Phosphatase: 95 U/L (ref 38–126)
Anion gap: 8 (ref 5–15)
BUN: 11 mg/dL (ref 8–23)
CO2: 25 mmol/L (ref 22–32)
Calcium: 8.1 mg/dL — ABNORMAL LOW (ref 8.9–10.3)
Chloride: 103 mmol/L (ref 98–111)
Creatinine, Ser: 0.85 mg/dL (ref 0.44–1.00)
GFR, Estimated: >60 mL/min (ref >60)
Glucose, Bld: 108 mg/dL — ABNORMAL HIGH (ref 70–99)
Potassium: 3.7 mmol/L (ref 3.5–5.1)
Sodium: 136 mmol/L (ref 135–145)
Total Bilirubin: 0.5 mg/dL (ref 0.3–1.2)
Total Protein: 6.1 g/dL — ABNORMAL LOW (ref 6.5–8.1)

## 2021-02-18 LAB — HEMOGLOBIN A1C
Hgb A1c MFr Bld: 5.8 % — ABNORMAL HIGH (ref 4.8–5.6)
Mean Plasma Glucose: 119.76 mg/dL

## 2021-02-18 LAB — MAGNESIUM: Magnesium: 1.9 mg/dL (ref 1.7–2.4)

## 2021-02-18 LAB — PHOSPHORUS: Phosphorus: 3.1 mg/dL (ref 2.5–4.6)

## 2021-02-18 LAB — HIV ANTIBODY (ROUTINE TESTING W REFLEX): HIV Screen 4th Generation wRfx: NONREACTIVE

## 2021-02-18 LAB — TSH: TSH: 0.682 u[IU]/mL (ref 0.350–4.500)

## 2021-02-18 LAB — BRAIN NATRIURETIC PEPTIDE: B Natriuretic Peptide: 100.6 pg/mL — ABNORMAL HIGH (ref 0.0–100.0)

## 2021-02-18 LAB — T4, FREE: Free T4: 0.88 ng/dL (ref 0.61–1.12)

## 2021-02-18 LAB — PREALBUMIN: Prealbumin: 20.4 mg/dL (ref 18–38)

## 2021-02-18 MED ORDER — LORAZEPAM 2 MG/ML IJ SOLN
1.0000 mg | Freq: Four times a day (QID) | INTRAMUSCULAR | Status: DC | PRN
  Administered 2021-02-18 – 2021-02-20 (×6): 1 mg via INTRAVENOUS
  Filled 2021-02-18 (×6): qty 1

## 2021-02-18 MED ORDER — ACETAMINOPHEN 650 MG RE SUPP: 650.0000 mg | Freq: Four times a day (QID) | RECTAL | Status: DC | PRN

## 2021-02-18 MED ORDER — NICOTINE 14 MG/24HR TD PT24
14.0000 mg | MEDICATED_PATCH | Freq: Every day | TRANSDERMAL | Status: DC
  Administered 2021-02-18 – 2021-02-22 (×5): 14 mg via TRANSDERMAL
  Filled 2021-02-18 (×8): qty 1

## 2021-02-18 MED ORDER — LEVETIRACETAM IN NACL 1500 MG/100ML IV SOLN
1500.0000 mg | Freq: Two times a day (BID) | INTRAVENOUS | Status: DC
  Administered 2021-02-18 – 2021-02-20 (×4): 1500 mg via INTRAVENOUS
  Filled 2021-02-18 (×5): qty 100

## 2021-02-18 MED ORDER — ACETAMINOPHEN 325 MG PO TABS
650.0000 mg | ORAL_TABLET | Freq: Four times a day (QID) | ORAL | Status: DC | PRN
  Administered 2021-02-21 – 2021-02-23 (×4): 650 mg via ORAL
  Filled 2021-02-18 (×4): qty 2

## 2021-02-18 MED ORDER — AMLODIPINE BESYLATE 5 MG PO TABS: 5.0000 mg | ORAL_TABLET | Freq: Every day | ORAL | Status: DC

## 2021-02-18 MED ORDER — VANCOMYCIN HCL 1250 MG/250ML IV SOLN
1250.0000 mg | Freq: Two times a day (BID) | INTRAVENOUS | Status: DC
  Administered 2021-02-18 – 2021-02-19 (×2): 1250 mg via INTRAVENOUS
  Filled 2021-02-18 (×4): qty 250

## 2021-02-18 MED ORDER — DEXTROSE 5 % IV SOLN
800.0000 mg | Freq: Three times a day (TID) | INTRAVENOUS | Status: DC
  Administered 2021-02-18 – 2021-02-23 (×13): 800 mg via INTRAVENOUS
  Filled 2021-02-18 (×16): qty 16

## 2021-02-18 MED ORDER — IOHEXOL 9 MG/ML PO SOLN
ORAL | Status: AC
  Filled 2021-02-18: qty 1000

## 2021-02-18 NOTE — Progress Notes (Signed)
[facility] PROGRESS NOTE   Kale Dols DXA:[id number] DOB: 09-28-1950 DOA: 02/17/2021  PCP: Mar Daring, PA-C  Brief History/Interval Summary: 70 y.o. female with medical history significant of HTN, ovarian cystic masses s/p hysterectomy and bilateral oophorectomy, obesity, and tobacco dependence who had not been feeling well for several days prior to admission.  Patient appeared to be very confused to family members.  Subsequently she was brought into the emergency department for further evaluation.  Initial assessment raise concern for meningitis as well as seizure activity.  She was hospitalized for further management.  Reason for Visit: Concern for herpes encephalitis versus meningitis/seizure activity  Consultants: Neurology  Procedures:  Lumbar puncture to be attempted today under fluoroscopy.   EEG.    Subjective/Interval History: Patient noted to be encephalopathic.  Unable to answer any questions.     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  MRI was done which showed abnormal findings in bilateral temporal lobes.  Seizure activity was also noted on EEG.  Neurology is following.   LP is pending.  Bedside attempt was not successful last night.   Patient remains on broad-spectrum coverage with ampicillin ceftriaxone and vancomycin.  Remains on acyclovir as well. On Keppra.  Continuous EEG monitoring.  History of essential hypertension Noted to be on amlodipine.  Seems to be high risk for aspiration.  Blood pressure noted to be low.  We will stop the amlodipine.  Hyperglycemia HbA1c 5.8.  Continue to monitor glucose daily.  Thyroid nodule TSH and free T4 levels are normal.  Thyroid ultrasound was done and no concerning features identified in the thyroid nodules.  No indication for biopsy based on the thyroid ultrasound report.  Outpatient monitoring.  Tobacco abuse Will  need counseling when she is more oriented.  Prolonged QT interval Avoid QT prolonging medications.  Monitor on telemetry.  EKG repeated this morning does not show QT progression.  Cardiomegaly Noted on chest x-ray.  Echocardiogram is pending.  Obesity Estimated body mass index is 40.56 kg/m as calculated from the following:   Height as of this encounter: 5\' 6"  (1.676 m).   Weight as of this encounter: 114 kg.   DVT Prophylaxis: SCDs Code Status: Full code Family Communication: No family at bedside Disposition Plan: To be determined  Status is: Inpatient  Remains inpatient appropriate because: Altered mental status, need for IV antibiotics,    Medications: Scheduled:  amLODipine  5 mg Oral Daily   insulin aspart  0-9 Units Subcutaneous Q4H   iohexol       nicotine  14 mg Transdermal Daily   sodium chloride flush  3 mL Intravenous Once   Continuous:  sodium chloride 75 mL/hr at 02/17/21 2353   acyclovir (ZOVIRAX) [phone] mg IVPB     acyclovir 810 mg (02/18/21 0622)   ampicillin (OMNIPEN) IV 2 g (02/18/21 0835)   cefTRIAXone (ROCEPHIN)  IV 2 g (02/18/21 0829)   levETIRAcetam 1,000 mg (02/18/21 0904)   vancomycin     MCN:[id number] **OR** acetaminophen, LORazepam  Antibiotics: Anti-infectives (From admission, onward)    Start     Dose/Rate Route Frequency Ordered Stop   02/18/21 2200  acyclovir (ZOVIRAX) 800 mg in dextrose 5 % 150 mL IVPB        800 mg 166 mL/hr over 60 Minutes Intravenous Every 8 hours 02/18/21 0911     02/18/21 1300  vancomycin (VANCOREADY) IVPB 1250 mg/250 mL        1,250 mg  166.7 mL/hr over 90 Minutes Intravenous Every 12 hours 02/18/21 0926     02/18/21 0415  vancomycin (VANCOCIN) 1,710 mg in sodium chloride 0.9 % 500 mL IVPB  Status:  Discontinued       Note to Pharmacy: Maximum dose of 3000mg .  See Hyperspace for full Linked Orders Report.   15 mg/kg  114 kg 250 mL/hr over 120 Minutes Intravenous Every 8 hours 02/17/21 2006 02/17/21  2010   02/17/21 2030  ampicillin (OMNIPEN) 2 g in sodium chloride 0.9 % 100 mL IVPB        2 g 300 mL/hr over 20 Minutes Intravenous Every 4 hours 02/17/21 2006     02/17/21 2030  cefTRIAXone (ROCEPHIN) 2 g in sodium chloride 0.9 % 100 mL IVPB        2 g 200 mL/hr over 30 Minutes Intravenous Every 12 hours 02/17/21 2006 03/03/21 2029   02/17/21 2015  vancomycin (VANCOCIN) 2,850 mg in sodium chloride 0.9 % 500 mL IVPB  Status:  Discontinued       See Hyperspace for full Linked Orders Report.   25 mg/kg  114 kg 250 mL/hr over 120 Minutes Intravenous  Once 02/17/21 2006 02/17/21 2010   02/17/21 2015  vancomycin (VANCOCIN) 2,250 mg in sodium chloride 0.9 % 500 mL IVPB        2,250 mg 250 mL/hr over 120 Minutes Intravenous  Once 02/17/21 2011 02/18/21 0305   02/17/21 1830  acyclovir (ZOVIRAX) 810 mg in dextrose 5 % 150 mL IVPB        10 mg/kg  81.2 kg (Adjusted) 166.2 mL/hr over 60 Minutes Intravenous Every 8 hours 02/17/21 1825 02/18/21 1829       Objective:  Vital Signs  Vitals:   02/18/21 0020 02/18/21 0210 02/18/21 0444 02/18/21 0719  BP: 110/66 (!) 131/59 128/73 (!) 106/58  Pulse: 85 86  91  Resp: 19 20 19 17   Temp:  99.4 F (37.4 C) 98.7 F (37.1 C) 98.1 F (36.7 C)  TempSrc:  Axillary Axillary Oral  SpO2: 94% 93%  94%  Weight:      Height:        Intake/Output Summary (Last 24 hours) at 02/18/2021 1008 Last data filed at 02/18/2021 0800 Gross per 24 hour  Intake --  Output 300 ml  Net -300 ml   Filed Weights   02/17/21 1600  Weight: 114 kg    General appearance: She is awake with eyes open but does not respond any questions.  Noted to be fidgety and slightly agitated. Resp: Clear to auscultation bilaterally.  Normal effort Cardio: S1-S2 is normal regular.  No S3-S4.  No rubs murmurs or bruit GI: Abdomen is soft.  Nontender nondistended.  Bowel sounds are present normal.  No masses organomegaly Extremities: No edema.  Noted to be moving all of her  extremities Neurologic:  No focal neurological deficits.    Lab Results:  Data Reviewed: I have personally reviewed following labs and imaging studies  CBC: Recent Labs  Lab 02/17/21 1638 02/17/21 1643 02/17/21 1845 02/18/21 0345  WBC 13.4*  --   --  17.3*  NEUTROABS 10.1*  --   --  15.3*  HGB 14.9 15.6* 15.3* 13.8  HCT 46.1* 46.0 45.0 43.4  MCV 87.6  --   --  87.9  PLT 185  --   --  601    Basic Metabolic Panel: Recent Labs  Lab 02/17/21 1638 02/17/21 1643 02/17/21 1845 02/18/21 0345  NA 139 141  140 136  K 3.8 3.6 3.5 3.7  CL 105 105  --  103  CO2 23  --   --  25  GLUCOSE 201* 194*  --  108*  BUN 15 16  --  11  CREATININE 0.96 0.90  --  0.85  CALCIUM 8.7*  --   --  8.1*  MG  --   --   --  1.9  PHOS  --   --   --  3.1    GFR: Estimated Creatinine Clearance: 81.2 mL/min (by C-G formula based on SCr of 0.85 mg/dL).  Liver Function Tests: Recent Labs  Lab 02/17/21 1638 02/18/21 0345  AST 19 17  ALT 17 18  ALKPHOS 100 95  BILITOT 0.3 0.5  PROT 6.6 6.1*  ALBUMIN 3.3* 3.1*      Coagulation Profile: Recent Labs  Lab 02/17/21 1638  INR 1.1     HbA1C: Recent Labs    02/18/21 0345  HGBA1C 5.8*    CBG: Recent Labs  Lab 02/17/21 1638 02/18/21 0034 02/18/21 0448 02/18/21 0753  GLUCAP 195* 133* 115* 115*      Thyroid Function Tests: Recent Labs    02/18/21 0345  TSH 0.682  FREET4 0.88     Recent Results (from the past 240 hour(s))  Resp Panel by RT-PCR (Flu A&B, Covid) Nasopharyngeal Swab     Status: None   Collection Time: 02/17/21  7:10 PM   Specimen: Nasopharyngeal Swab; Nasopharyngeal(NP) swabs in vial transport medium  Result Value Ref Range Status   SARS Coronavirus 2 by RT PCR NEGATIVE NEGATIVE Final    Comment: (NOTE) SARS-CoV-2 target nucleic acids are NOT DETECTED.  The SARS-CoV-2 RNA is generally detectable in upper respiratory specimens during the acute phase of infection. The lowest concentration of SARS-CoV-2  viral copies this assay can detect is 138 copies/mL. A negative result does not preclude SARS-Cov-2 infection and should not be used as the sole basis for treatment or other patient management decisions. A negative result may occur with  improper specimen collection/handling, submission of specimen other than nasopharyngeal swab, presence of viral mutation(s) within the areas targeted by this assay, and inadequate number of viral copies(<138 copies/mL). A negative result must be combined with clinical observations, patient history, and epidemiological information. The expected result is Negative.  Fact Sheet for Patients:  EntrepreneurPulse.com.au  Fact Sheet for Healthcare Providers:  IncredibleEmployment.be  This test is no t yet approved or cleared by the [place] FDA and  has been authorized for detection and/or diagnosis of SARS-CoV-2 by FDA under an Emergency Use Authorization (EUA). This EUA will remain  in effect (meaning this test can be used) for the duration of the COVID-19 declaration under Section 564(b)(1) of the Act, 21 U.S.C.section 360bbb-3(b)(1), unless the authorization is terminated  or revoked sooner.       Influenza A by PCR NEGATIVE NEGATIVE Final   Influenza B by PCR NEGATIVE NEGATIVE Final    Comment: (NOTE) The Xpert Xpress SARS-CoV-2/FLU/RSV plus assay is intended as an aid in the diagnosis of influenza from Nasopharyngeal swab specimens and should not be used as a sole basis for treatment. Nasal washings and aspirates are unacceptable for Xpert Xpress SARS-CoV-2/FLU/RSV testing.  Fact Sheet for Patients: EntrepreneurPulse.com.au  Fact Sheet for Healthcare Providers: IncredibleEmployment.be  This test is not yet approved or cleared by the [place] FDA and has been authorized for detection and/or diagnosis of SARS-CoV-2 by FDA under an Emergency Use Authorization  (EUA). This  EUA will remain in effect (meaning this test can be used) for the duration of the COVID-19 declaration under Section 564(b)(1) of the Act, 21 U.S.C. section 360bbb-3(b)(1), unless the authorization is terminated or revoked.  Performed at [facility], [place] [address] [place], [place] [address]       Radiology Studies: MR BRAIN WO CONTRAST  Result Date: 02/17/2021 CLINICAL DATA:  Neuro deficit, acute, stroke suspected EXAM: MRI HEAD WITHOUT CONTRAST TECHNIQUE: Multiplanar, multiecho pulse sequences of the brain and surrounding structures were obtained without intravenous contrast. COMPARISON:  No prior MRI, correlation made with CT head 02/17/2021. FINDINGS: Brain: Restricted diffusion with ADC correlate, most prominent in the in the bilateral anterior temporal lobes, left greater than right medial temporal lobes, and left temporoparietal region. These areas are associated with increased T2 signal and gyral swelling. No hemorrhage, mass, mass effect, or midline shift. T2 hyperintense signal in the periventricular white matter, likely the sequela of chronic small vessel ischemic disease. Vascular: Normal flow voids. Skull and upper cervical spine: Normal marrow signal. Sinuses/Orbits: Negative. Other: The mastoids are well aerated. IMPRESSION: Restricted diffusion and increased T2 signal, most prominent in the bilateral anterior temporal lobes, left greater than right medial temporal lobes, and left temporoparietal region, which is nonspecific but could be seen in the setting of viral or autoimmune encephalitis. Seizures can also appears similar. Electronically Signed   By: Merilyn Baba M.D.   On: 02/17/2021 18:13   DG Chest Port 1 View  Result Date: 02/17/2021 CLINICAL DATA:  Shortness of breath EXAM: PORTABLE CHEST 1 VIEW COMPARISON:  Portable exam 1823 hours compared to 01/02/2017 FINDINGS: Enlargement of cardiac silhouette with pulmonary vascular congestion. Decreased lung  volumes versus previous exam with mild bibasilar atelectasis. Scattered interstitial prominence, question mild pulmonary edema. No pleural effusion or pneumothorax. Bones demineralized. IMPRESSION: Enlargement of cardiac silhouette with pulmonary vascular congestion and interstitial prominence question pulmonary edema. Bibasilar atelectasis. Electronically Signed   By: Lavonia Dana M.D.   On: 02/17/2021 18:35   Overnight EEG with video  Result Date: 02/18/2021 Lora Havens, MD     02/18/2021  9:17 AM Patient Name: Robin Adams MRN: [id number] Epilepsy Attending: Lora Havens Referring Physician/Provider: Dr Lesleigh Noe Duration: 02/17/2021 2047 to 02/18/2021 0915  Patient history: 70 y.o. female who presented to the ED via EMS for evaluation of altered mental status with confusion, difficulty following commands, and nonverbal state after being found by her daughter altered this afternoon. EEG to evaluate for seizure  Level of alertness: Awake, asleep  AEDs during EEG study: None  Technical aspects: This EEG study was done with scalp electrodes positioned according to the 10-20 International system of electrode placement. Electrical activity was acquired at a sampling rate of 500Hz  and reviewed with a high frequency filter of 70Hz  and a low frequency filter of 1Hz . EEG data were recorded continuously and digitally stored.  Description: The posterior dominant rhythm consists of 8 Hz activity of moderate voltage (25-35 uV) seen predominantly in posterior head regions, asymmetric ( left< right) and reactive to eye opening and eye closing. EEG showed continuous 3 to 6 Hz theta-delta slowing in left hemisphere, maximal left frontotemporal region. Lateralized periodic discharges were also noted in left hemisphere, maximal left frontotemporal region with fluctuation frequency between 0.5-1hz . Hyperventilation and photic stimulation were not performed.    ABNORMALITY - Lateralized periodic discharges, left  hemisphere, maximal left frontotemporal region - Continuous slow, left hemisphere, maximal left frontotemporal region - background asymmetry, left<right  IMPRESSION: This study showed evidence of epileptogenicity and cortical dysfunction arising from left hemisphere, maximal left frontotemporal region likely secondary to underlying structural abnormality. This eeg pattern is on the ictal -interictal continuum with high potential for seizures. No definite seizures were seen throughout the recording.  Priyanka Barbra Sarks   US THYROID  Result Date: 02/18/2021 CLINICAL DATA:  Thyroid lesion EXAM: THYROID ULTRASOUND TECHNIQUE: Ultrasound examination of the thyroid gland and adjacent soft tissues was performed. COMPARISON:  None. FINDINGS: Parenchymal Echotexture: Mildly heterogeneous Isthmus: 0.5 cm Right lobe: 3.4 x 1.3 x 1.8 cm Left lobe: 4.6 x 1.8 x 1.5 cm _________________________________________________________ Estimated total number of nodules >/= 1 cm: 2 Number of spongiform nodules >/=  2 cm not described below (TR1): 0 Number of mixed cystic and solid nodules >/= 1.5 cm not described below (TR2): 0 _________________________________________________________ Nodule # 1: Location: Isthmus; superior Maximum size: 3.1 cm; Other 2 dimensions: 2.6 x 2.0 cm Composition: mixed cystic and solid (1) Echogenicity: isoechoic (1) Shape: not taller-than-wide (0) Margins: smooth (0) Echogenic foci: none (0) ACR TI-RADS total points: 2. ACR TI-RADS risk category: TR2 (2 points). ACR TI-RADS recommendations: This nodule does NOT meet TI-RADS criteria for biopsy or dedicated follow-up. _________________________________________________________ 0.9 x 0.6 x 0.8 cm cystic nodule in the right superior thyroid lobe does not meet criteria for imaging surveillance or FNA. IMPRESSION: Isthmus and right thyroid nodules do not meet criteria for FNA or imaging surveillance. No suspicious thyroid nodules. The above is in keeping with the ACR  TI-RADS recommendations - J Am Coll Radiol 2017;14:587-595. Electronically Signed   By: Miachel Roux M.D.   On: 02/18/2021 07:37   CT HEAD CODE STROKE WO CONTRAST  Result Date: 02/17/2021 CLINICAL DATA:  Code stroke. Neuro deficit, acute, stroke suspected. Confusion. EXAM: CT HEAD WITHOUT CONTRAST TECHNIQUE: Contiguous axial images were obtained from the base of the skull through the vertex without intravenous contrast. COMPARISON:  12/16/2011 FINDINGS: Brain: No acute finding by CT. Mild chronic small-vessel ischemic changes of the white matter in left basal ganglia. No identifiable acute infarction, mass lesion, hemorrhage, hydrocephalus or extra-axial collection. Vascular: There is atherosclerotic calcification of the major vessels at the base of the brain. Skull: Negative Sinuses/Orbits: Clear/normal Other: None ASPECTS ([place] Stroke Program Early CT Score) - Ganglionic level infarction (caudate, lentiform nuclei, internal capsule, insula, M1-M3 cortex): 7 - Supraganglionic infarction (M4-M6 cortex): 3 Total score (0-10 with 10 being normal): 10 IMPRESSION: 1. No acute finding by CT. Mild chronic small-vessel ischemic change of the cerebral hemispheric white matter and left basal ganglia. 2. These results were communicated to Dr. Curly Shores at 4:49 pm on 02/17/2021 by text page via the [facility] messaging system. 3. ASPECTS is 10 Electronically Signed   By: Nelson Chimes M.D.   On: 02/17/2021 16:50   CT ANGIO HEAD NECK W WO CM W PERF (CODE STROKE)  Result Date: 02/17/2021 CLINICAL DATA:  Aphasia, confusion, stroke suspected EXAM: CT ANGIOGRAPHY HEAD AND NECK CT PERFUSION BRAIN TECHNIQUE: Multidetector CT imaging of the head and neck was performed using the standard protocol during bolus administration of intravenous contrast. Multiplanar CT image reconstructions and MIPs were obtained to evaluate the vascular anatomy. Carotid stenosis measurements (when applicable) are obtained utilizing NASCET criteria, using  the distal internal carotid diameter as the denominator. Multiphase CT imaging of the brain was performed following IV bolus contrast injection. Subsequent parametric perfusion maps were calculated using RAPID software. CONTRAST:  155mL OMNIPAQUE IOHEXOL 350 MG/ML SOLN COMPARISON:  02/17/2021 CT  head codes stroke, 12/16/2011 CT head. FINDINGS: CT HEAD FINDINGS Please see same day CT head code stroke for noncontrast findings. CTA NECK FINDINGS Aortic arch: Standard branching. Imaged portion shows no evidence of aneurysm or dissection. No significant stenosis of the major arch vessel origins. Right carotid system: No evidence of dissection, stenosis (50% or greater) or occlusion. Left carotid system: No evidence of dissection, stenosis (50% or greater) or occlusion. Vertebral arteries: Codominant. No evidence of dissection, stenosis (50% or greater) or occlusion. Skeleton: No acute osseous abnormality. Other neck: Hypoattenuating lesion in the thyroid isthmus measures up to 1.5 x 1.9 x 2.8 cm. The neck is otherwise negative. Upper chest: Dependent atelectasis. No pleural effusion or focal pulmonary opacity. Review of the MIP images confirms the above findings CTA HEAD FINDINGS Anterior circulation: Both internal carotid arteries are patent to the termini, without stenosis or other abnormality. A1 segments patent. Normal anterior communicating artery. Anterior cerebral arteries are patent to their distal aspects. No M1 stenosis or occlusion. Normal MCA bifurcations. Distal MCA branches perfused and symmetric. Posterior circulation: Vertebral arteries widely patent to the vertebrobasilar junction without stenosis. Posterior inferior cerebral arteries patent bilaterally. Basilar patent to its distal aspect. Superior cerebral arteries patent bilaterally. Bilateral posterior communicating arteries are visualized. Diminutive right P1, with near fetal origin of the right PCA. Normal left P1. PCAs well perfused to their  distal aspects without stenosis. Venous sinuses: As permitted by contrast timing, patent. Anatomic variants: None significant Review of the MIP images confirms the above findings CT Brain Perfusion Findings: ASPECTS: 10 CBF (<30%) Volume: 13mL Perfusion (Tmax>6.0s) volume: 8mL Mismatch Volume: 51mL Infarction Location:None IMPRESSION: 1. No intracranial large vessel occlusion or hemodynamically significant stenosis. 2. No hemodynamically significant stenosis in the neck. 3. CT perfusion demonstrates no infarct core or penumbra. 4. Incidental note is made of hypoattenuating lesion in the thyroid isthmus, measuring up to 2.8 cm. If this has not previously been evaluated, an ultrasound of the thyroid is recommended. Electronically Signed   By: Merilyn Baba M.D.   On: 02/17/2021 17:17       LOS: 1 day   [facility] Pager on [email or web address]  02/18/2021, 10:08 AM

## 2021-02-18 NOTE — Progress Notes (Signed)
Neurology Progress Note   CC:" Get off of me"  History is obtained from: EMS, Patient's daughter, chart review, unable to obtain from patient due to nonverbal state  HPI: Shweta Aman is a 70 y.o. 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 Patient's daughter states that she last had dinner last night with Ms. Guettler around 6:00 PM in her usual state of health and went to work. When she returned this morning, she went to sleep and sent a text message to her mother this afternoon when she woke up around 3:00 PM. Her mother sent a response that did not make sense and when her daughter went upstairs to check on her, she found Ms. Buxton to be confused and unable to follow commands. Ms. Dacquisto lives with her daughter in the upper level of her house. She states that it took quite a while to get her mother down the stairs due to trouble following instructions and once they were down, she "almost passed out". EMS was subsequently activated. On arrival, she was found with an SpO2 of 85% on room air and her heart rate was as low as 42 en route to the hospital. She was activated as a Code Stroke for further evaluation.  24 hours:  Overnight, she continues to be intermittently agitated and encephalopathic but now with verbal output and follows simple commands.  EEG overnight with lateralized periodic discharges, left hemisphere, maximal left frontotemporal region and continuously slow maximally left frontotemporal region   ROS: Unable to obtain due to altered mental status.   Past Medical History:  Diagnosis Date   Hernia, hiatal    Hypertension    Past Surgical History:  Procedure Laterality Date   CESAREAN SECTION     FRACTURE SURGERY Right    TUBAL LIGATION      Family History  Problem Relation Age of Onset   Kyphosis Mother    Alcohol abuse Father    Liver disease Father    Kidney failure Father    Thyroid disease Maternal Grandmother    Breast cancer Paternal Grandmother    Heart disease Paternal Grandfather    Rheum arthritis Daughter    Social History:   reports that she has been smoking cigarettes. She has a 50.00 pack-year smoking history. She has never used smokeless tobacco. She reports that she does not drink alcohol and does not use drugs.  Medications  Current Facility-Administered Medications:    0.9 %  sodium chloride infusion, , Intravenous, Continuous, Doutova, Anastassia, MD, Last Rate: 75 mL/hr at 02/17/21 2353, New Bag at 02/17/21 2353   acetaminophen (TYLENOL) tablet 650 mg, 650 mg, Oral, Q6H PRN **OR** acetaminophen (TYLENOL) suppository 650 mg, 650 mg, Rectal, Q6H PRN, Doutova, Anastassia, MD   acyclovir (ZOVIRAX) 800 mg in dextrose 5 % 150 mL IVPB, 800 mg, Intravenous, Q8H, Chen, Lydia D, RPH   acyclovir (ZOVIRAX) 810 mg in dextrose 5 % 150 mL IVPB, 10 mg/kg (Adjusted), Intravenous, Q8H, Donnamae Jude, RPH, Last Rate: 166.2 mL/hr at 02/18/21 1108, 810 mg at 02/18/21 1108   ampicillin (OMNIPEN) 2 g in sodium chloride 0.9 % 100 mL IVPB, 2 g, Intravenous, Q4H, Doutova, Anastassia, MD, Last Rate: 300 mL/hr at 02/18/21 0835, 2 g at 02/18/21 0835   cefTRIAXone (ROCEPHIN) 2 g in sodium chloride 0.9 % 100 mL  IVPB, 2 g, Intravenous, Q12H, Doutova, Anastassia, MD, Last Rate: 200 mL/hr at 02/18/21 0829, 2 g at 02/18/21 0829   insulin aspart (novoLOG) injection 0-9 Units, 0-9 Units, Subcutaneous, Q4H, Doutova, Anastassia, MD, 1 Units at 02/18/21 0037   iohexol (OMNIPAQUE) 9 MG/ML oral solution, , , ,    levETIRAcetam (KEPPRA) IVPB 1000 mg/100 mL premix, 1,000 mg, Intravenous, Q12H, Doutova, Anastassia, MD, Last Rate: 400 mL/hr at 02/18/21 0904, 1,000 mg at 02/18/21 0904   LORazepam (ATIVAN) injection 1 mg, 1 mg, Intravenous, Q6H PRN,  Kristopher Oppenheim, DO, 1 mg at 02/18/21 0950   nicotine (NICODERM CQ - dosed in mg/24 hours) patch 14 mg, 14 mg, Transdermal, Daily, Doutova, Anastassia, MD, 14 mg at 02/18/21 1104   sodium chloride flush (NS) 0.9 % injection 3 mL, 3 mL, Intravenous, Once, Doutova, Anastassia, MD   vancomycin (VANCOREADY) IVPB 1250 mg/250 mL, 1,250 mg, Intravenous, Q12H, Benetta Spar D, RPH  Exam: Current vital signs: BP 131/72 (BP Location: Right Arm)   Pulse 81   Temp 98.1 F (36.7 C) (Oral)   Resp 19   Ht 5\' 6"  (1.676 m)   Wt 114 kg   SpO2 95%   BMI 40.56 kg/m  Vital signs in last 24 hours: Temp:  [98.1 F (36.7 C)-99.4 F (37.4 C)] 98.1 F (36.7 C) (11/10 0719) Pulse Rate:  [81-103] 81 (11/10 1134) Resp:  [13-21] 19 (11/10 1134) BP: (106-189)/(58-114) 131/72 (11/10 1134) SpO2:  [93 %-100 %] 95 % (11/10 1134) Weight:  [673 kg] 114 kg (11/09 1600)  GENERAL: Awake, alert, in no acute distress Psych: Affect appropriate for situation, patient is overall calm and cooperative though occasionally resistant. Eyes closed but when examining, will wake up and say "get off of me" and "God!" Head: Normocephalic and atraumatic, without obvious abnormality EENT: Normal conjunctivae, dry mucous membranes, no OP obstruction LUNGS: Normal respiratory effort. Non-labored breathing on room air CV: Regular rate and rhythm on telemetry, no bradycardia witnessed during our evaluation ABDOMEN: Soft, non-tender, non-distended Extremities: warm, well perfused, without obvious deformity  NEURO:  Mental Status: lying in bed with eyes closed but arousable to verbal stimulus.Aaox1 to name but not year or place. Speech is clear, without dysarthria, but speech is not entirely fluent. Naming poor. Follows simple commands to shut eyes, touch finger. Comprehension and insight limited.  Cranial Nerves:  II: PERRLA and brisk. visual fields full to confrontation.  III, IV, VI: EOMI to tracking examiner's face bilaterally  V:  Sensation is intact to eyelash brush VII: Face is symmetric resting and smiling.   VIII: Hearing intact to voice Remainder of examination is limited due to limited patient participation.  Motor: moves all 4 extremities anti-gravity spontaneously  Sensation: Equally reactive to light noxious stimulation in all 4 extremities with no apparent extinction to double simultaneous stimuli Coordination: finger-nose-finger in tact within limits of limited participation  DTRs: 2+ throughout. Gait: Deferred  NIHSS: 1a Level of Conscious.: 0 1b LOC Questions: 1 1c LOC Commands: 2 2 Best Gaze: 0 3 Visual: 0 4 Facial Palsy: 0 5a Motor Arm - left: 0 5b Motor Arm - Right: 0 6a Motor Leg - Left: 0 6b Motor Leg - Right: 0 7 Limb Ataxia: 0 8 Sensory: 0 9 Best Language: 2 10 Dysarthria: 0 11 Extinct. and Inatten.: 0 TOTAL: 9 --> 5   Labs I have reviewed labs in epic and the results pertinent to this consultation are: CBC    Component Value Date/Time   WBC 17.3 (  H) 02/18/2021 0345   RBC 4.94 02/18/2021 0345   HGB 13.8 02/18/2021 0345   HGB 14.9 09/10/2018 1159   HCT 43.4 02/18/2021 0345   HCT 45.1 09/10/2018 1159   PLT 191 02/18/2021 0345   PLT 185 09/10/2018 1159   MCV 87.9 02/18/2021 0345   MCV 85 09/10/2018 1159   MCV 87 12/16/2011 1212   MCH 27.9 02/18/2021 0345   MCHC 31.8 02/18/2021 0345   RDW 14.3 02/18/2021 0345   RDW 13.6 09/10/2018 1159   RDW 13.7 12/16/2011 1212   LYMPHSABS 1.3 02/18/2021 0345   LYMPHSABS 1.8 09/10/2018 1159   MONOABS 0.7 02/18/2021 0345   EOSABS 0.0 02/18/2021 0345   EOSABS 0.3 09/10/2018 1159   BASOSABS 0.0 02/18/2021 0345   BASOSABS 0.1 09/10/2018 1159   CMP     Component Value Date/Time   NA 136 02/18/2021 0345   NA 146 (H) 09/10/2018 1159   NA 143 12/16/2011 1212   K 3.7 02/18/2021 0345   K 4.0 12/16/2011 1212   CL 103 02/18/2021 0345   CL 109 (H) 12/16/2011 1212   CO2 25 02/18/2021 0345   CO2 28 12/16/2011 1212   GLUCOSE 108 (H)  02/18/2021 0345   GLUCOSE 97 12/16/2011 1212   BUN 11 02/18/2021 0345   BUN 12 09/10/2018 1159   BUN 14 12/16/2011 1212   CREATININE 0.85 02/18/2021 0345   CREATININE 0.83 12/16/2011 1212   CALCIUM 8.1 (L) 02/18/2021 0345   CALCIUM 8.5 12/16/2011 1212   PROT 6.1 (L) 02/18/2021 0345   PROT 6.5 09/10/2018 1159   PROT 6.9 12/16/2011 1212   ALBUMIN 3.1 (L) 02/18/2021 0345   ALBUMIN 4.3 09/10/2018 1159   ALBUMIN 3.5 12/16/2011 1212   AST 17 02/18/2021 0345   AST 16 12/16/2011 1212   ALT 18 02/18/2021 0345   ALT 15 12/16/2011 1212   ALKPHOS 95 02/18/2021 0345   ALKPHOS 115 12/16/2011 1212   BILITOT 0.5 02/18/2021 0345   BILITOT 0.4 09/10/2018 1159   BILITOT 0.4 12/16/2011 1212   GFRNONAA >60 02/18/2021 0345   GFRNONAA >60 12/16/2011 1212   GFRAA 81 09/10/2018 1159   GFRAA >60 12/16/2011 1212   Lipid Panel     Component Value Date/Time   CHOL 131 09/10/2018 1159   TRIG 93 09/10/2018 1159   HDL 48 09/10/2018 1159   CHOLHDL 2.7 09/10/2018 1159   LDLCALC 64 09/10/2018 1159   Lab Results  Component Value Date   HGBA1C 5.8 (H) 02/18/2021   Imaging I have reviewed the images obtained:  CT-scan of the brain 11/9: 1. No acute finding by CT. Mild chronic small-vessel ischemic change of the cerebral hemispheric white matter and left basal ganglia. 2. These results were communicated to Dr. Curly Shores at 4:49 pm on 02/17/2021 by text page via the [facility] messaging system. 3. ASPECTS is 10  CT angio head and neck with CT cerebral perfusion 11/9: 1. No intracranial large vessel occlusion or hemodynamically significant stenosis. 2. No hemodynamically significant stenosis in the neck. 3. CT perfusion demonstrates no infarct core or penumbra. 4. Incidental note is made of hypoattenuating lesion in the thyroid isthmus, measuring up to 2.8 cm. If this has not previously been evaluated, an ultrasound of the thyroid is recommended.  MRI Brain 11/9 Restricted diffusion and increased T2 signal,  most prominent in the bilateral anterior temporal lobes, left greater than right medial temporal lobes, and left temporoparietal region, which is nonspecific but could be seen in the setting of  viral or autoimmune encephalitis. Seizures can also appears similar.  EEG overnight 11/8-11/9: left temporoparietal PLEDs , no seizures   Thyroid ultrasound: no suspicious thyroid nodules ascertained  Assessment: 70 y.o. female who presented to the ED via EMS for evaluation of altered mental status with confusion, difficulty following commands, and nonverbal state after being found by her daughter altered on 11/10.  Although she had been globally aphasic initially, her examination is now non-focal, although she remains globally encephalopathic.  CT head, CTA head and neck, and CTP unrevealing for acute entity such as hemorrhagic or ischemic stroke.  MRI showed bilateral temporal lobe diffusion restriction worse on the left than the right, with matched FLAIR changes suggestive of onset least 6 hours prior to admission in a pattern very atypical for vascular process, but highly concerning for an infectious/inflammatory etiology.  HIV negative, respiratory panel negative, TSH .682 with T4 of 0.88,A1c 5.8, mag 1.9  Leading differential is acute viral meningitis with suspicion for herpetic meningitis and lower suspicion for bacterial meningitis.  Also potential for an autoimmune encephalitis/paraneoplastic encephalitis, though patient does appear to be improving without immunosuppression  Recommendations: - LP under fluoroscopy today:  with CSF studies: cell count with differential, protein, glucose, gram stain, HSV PCR, CMV IgM, EBV, cryptococcal antigen, oligoclonal bands, autoimmune encephalitis [facility] send out, [facility] labs HSV IgG CSF and VZV IgG; unfortunately this patient did not tolerate this due to her encephalopathy, she will need lumbar puncture under anesthesia - Serum T3 pending also - Serum autoimmune  encephalitis send out to [facility]  - Serum thyroid autoencephalitis labs pending  -MRI brain w contrast for better characterization (without contrast study already completed) - CT chest, abd, pelvis to evaluate for occult malignancy  -Please note 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 but no evidence of invasion." - We will discontinue long-term EEG monitoring for MRI, increasing Keppra to 1500 mg twice daily given she has been receiving frequent Ativan while on EEG - Empiric acyclovir for meningitis coverage pending HSV PCR results - Empiric bacterial meningitis coverage as well though lower concern for bacterial meningitis - Neurology will continue to follow  Lennie Hummer, PA-C Neurology   Attending Neurologist's note:  I personally saw this patient, gathering history, performing a full neurologic examination, reviewing relevant labs, personally reviewing relevant imaging including EEG and MRI brain as well as head CTs and CTA, and formulated the assessment and plan, adding the note above for completeness and clarity to accurately reflect my thoughts  Lesleigh Noe MD-PhD [facility] [phone]  Available 7 AM to 7 PM, outside these hours please contact Neurologist on call listed on [place] than 35 minutes were spent in care of this patient today including reviewing MRI brain with family at bedside, discussing differential diagnosis and plan as above

## 2021-02-18 NOTE — Progress Notes (Signed)
OT Cancellation Note  Patient Details Name: Robin Adams MRN: [id number] DOB: [birth year]   Cancelled Treatment:    Reason Eval/Treat Not Completed: Patient not medically ready. RN reported that pt is remaining too aggressive/agitated to take her out of restraints at this time. Additionally, RN reported that she has not been following any commands or responding to any questions. OT will follow up when pt is able to safely participate in therapy session.   Jaden Abreu H., OTR/L Acute Rehabilitation  Darden Flemister Elane Yolanda Bonine 02/18/2021, 11:15 AM

## 2021-02-18 NOTE — Progress Notes (Signed)
D/C'd patient. [facility] notified. Patient had no skin break down. Patient was agitated, the nurse ativan during the D/C process because patient kept trying to get out of the bed.

## 2021-02-18 NOTE — Progress Notes (Signed)
PT Cancellation Note  Patient Details Name: Robin Adams MRN: [id number] DOB: Nov 13, 1950   Cancelled Treatment:    Reason Eval/Treat Not Completed: Medical issues which prohibited therapy. Pt to agitated to participate. Will continue to monitor for readiness for PT.    Shary Decamp [facility] 02/18/2021, 11:38 AM [place] Pager [phone] Office [phone]

## 2021-02-18 NOTE — Progress Notes (Signed)
LTM maint complete - no skin breakdown under: Fp1 Fp2 Ref [facility] monitored, Event button test confirmed by [facility].

## 2021-02-18 NOTE — Progress Notes (Addendum)
Pharmacy Antibiotic Note  Robin Adams is a 70 y.o. female admitted on 02/17/2021 with possible CNS infection.  Initial broad coverage with potential for meningitis including ampicillin 2g q4h and ceftriaxone 2g q12h. Pharmacy has been consulted for Vancomycin and acyclovir dosing.  Vancomycin 2250 mg x 1 load given. ClCr 81 ml/min. Will use trough dosing, goal 15-20 for meningitis. Patient going for LP today.   Plan: Acyclovir 800mg  q8h with NS @75mL /hr for renal protection (decreased due to concern for fluid overload)  Vanc 1250mg  q12h   Ceftriaxone 2g q12 per team Ampicillin 2g q4 hr per team Monitor cultures, clinical status, renal fx, vanc level Narrow abx as able and f/u duration    Height: 5\' 6"  (167.6 cm) Weight: 114 kg (251 lb 5.2 oz) IBW/kg (Calculated) : 59.3  Temp (24hrs), Avg:98.7 F (37.1 C), Min:98.1 F (36.7 C), Max:99.4 F (37.4 C)  Recent Labs  Lab 02/17/21 1638 02/17/21 1643 02/18/21 0345  WBC 13.4*  --  17.3*  CREATININE 0.96 0.90 0.85     Estimated Creatinine Clearance: 81.2 mL/min (by C-G formula based on SCr of 0.85 mg/dL).    Allergies  Allergen Reactions   Penicillins    Tetanus Toxoids Hives    Antimicrobials this admission: Acyclovir 11/9  > Ampicillin 11/9  > Ceftriaxone 11/9  > Vancomycin 11/9  >  Microbiology results: 11/9 HSV PCR: 11/9 CMV IgM: 11/9 RSV 11/9 EBV: 11/10 CSF culture:  11/10 thyroid peroxidase Ab: 11/10 thyroglobulin ab:  Thank you for allowing pharmacy to be a part of this patient's care.  Benetta Spar, PharmD, BCPS, BCCP Clinical Pharmacist  Please check AMION for all [place] phone numbers After 10:00 PM, call [place] [phone]

## 2021-02-18 NOTE — Progress Notes (Signed)
RN has attempted to contact EEG team to inform pt is now in room, but no answer x 2.

## 2021-02-18 NOTE — Progress Notes (Signed)
Pt transported down to fluoro for LP with transporter and daughter via bed.

## 2021-02-18 NOTE — Progress Notes (Signed)
LP attempted in Fluoro. Patient was asleep upon arrival but became quickly agitated/combative when placed prone on Fluoro table. Ginger RN came to Fluoro to administer PRN ativan. Patient remained too agitated/combative to safely perform procedure. She refused to stay laying on table, made vigorous attempts to get off the table.   No procedure performed, patient returned to 3W. Team  notified. If team feels LP is necessary please re-order under IR with moderate sedation.  Soyla Dryer, [place] [phone] 02/18/2021, 10:13 AM

## 2021-02-18 NOTE — Consult Note (Signed)
Chief Complaint: Patient was seen in consultation today for altered mental status   Referring Physician(s): Dr. Roel Cluck   Supervising Physician: Mir, Sharen Heck  Patient Status: [facility] - In-pt  History of Present Illness: Robin Adams is a 70 y.o. female with a medical history significant for HTN, ovarian cystic masses s/p hysterectomy and bilateral oophorectomy, obesity and tobacco dependence. She presented to the ED 02/17/21 with confusion, global aphasia and dysarthria initially worked up as a Code Stroke. Imaging showed findings concerning for viral or autoimmune encephalitis and she was referred to radiology for a lumbar puncture under fluoroscopy.   The patient presented to Radiology today for LP and was agitated/combative and unable to stay still for the procedure despite administration of IV ativan. Procedure cancelled and IR has been asked to perform this procedure under moderate sedation.   Past Medical History:  Diagnosis Date   Hernia, hiatal    Hypertension     Past Surgical History:  Procedure Laterality Date   CESAREAN SECTION     FRACTURE SURGERY Right    TUBAL LIGATION      Allergies: Penicillins and Tetanus toxoids  Medications: Prior to Admission medications   Medication Sig Start Date End Date Taking? Authorizing Provider  acetaminophen (TYLENOL) 500 MG tablet Take 1,000 mg by mouth every 6 (six) hours as needed for moderate pain or headache.   Yes [provider]  ibuprofen (ADVIL) 200 MG tablet Take 400 mg by mouth every 6 (six) hours as needed for headache or moderate pain.   Yes [provider]  Multiple Vitamin (MULTI-VITAMIN PO) Take 1 tablet by mouth daily.   Yes [provider]  amLODipine (NORVASC) 5 MG tablet Take 1 tablet (5 mg total) by mouth daily. Patient not taking: No sig reported 06/06/17   Trinna Post, PA-C     Family History  Problem Relation Age of Onset   Kyphosis Mother    Alcohol abuse Father     Liver disease Father    Kidney failure Father    Thyroid disease Maternal Grandmother    Breast cancer Paternal Grandmother    Heart disease Paternal Grandfather    Rheum arthritis Daughter     Social History   Socioeconomic History   Marital status: Legally Separated    Spouse name: Not on file   Number of children: Not on file   Years of education: Not on file   Highest education level: Not on file  Occupational History   Not on file  Tobacco Use   Smoking status: Every Day    Packs/day: 1.00    Years: 50.00    Pack years: 50.00    Types: Cigarettes   Smokeless tobacco: Never  Vaping Use   Vaping Use: Never used  Substance and Sexual Activity   Alcohol use: No   Drug use: No   Sexual activity: Not Currently    Partners: Male    Birth control/protection: Post-menopausal  Other Topics Concern   Not on file  Social History Narrative   Not on file   Social Determinants of Health   Financial Resource Strain: Not on file  Food Insecurity: Not on file  Transportation Needs: Not on file  Physical Activity: Not on file  Stress: Not on file  Social Connections: Not on file    Review of Systems: A 12 point ROS discussed and pertinent positives are indicated in the HPI above.  All other systems are negative.  Review of Systems  Unable  to perform ROS: Mental status change   Vital Signs: BP 131/72 (BP Location: Right Arm)   Pulse 81   Temp 98.5 F (36.9 C) (Oral)   Resp 19   Ht 5\' 6"  (1.676 m)   Wt 251 lb 5.2 oz (114 kg)   SpO2 95%   BMI 40.56 kg/m   Physical Exam Constitutional:      General: She is not in acute distress.    Appearance: She is obese.     Comments: Sedated. Responds minimally to voice and touch.   HENT:     Mouth/Throat:     Mouth: Mucous membranes are dry.     Pharynx: Oropharynx is clear.  Cardiovascular:     Rate and Rhythm: Normal rate and regular rhythm.     Pulses: Normal pulses.     Heart sounds: Normal heart sounds.   Pulmonary:     Effort: Pulmonary effort is normal.     Breath sounds: Normal breath sounds.  Abdominal:     General: Bowel sounds are normal.     Palpations: Abdomen is soft.  Skin:    General: Skin is warm and dry.    Imaging: MR BRAIN WO CONTRAST  Result Date: 02/17/2021 CLINICAL DATA:  Neuro deficit, acute, stroke suspected EXAM: MRI HEAD WITHOUT CONTRAST TECHNIQUE: Multiplanar, multiecho pulse sequences of the brain and surrounding structures were obtained without intravenous contrast. COMPARISON:  No prior MRI, correlation made with CT head 02/17/2021. FINDINGS: Brain: Restricted diffusion with ADC correlate, most prominent in the in the bilateral anterior temporal lobes, left greater than right medial temporal lobes, and left temporoparietal region. These areas are associated with increased T2 signal and gyral swelling. No hemorrhage, mass, mass effect, or midline shift. T2 hyperintense signal in the periventricular white matter, likely the sequela of chronic small vessel ischemic disease. Vascular: Normal flow voids. Skull and upper cervical spine: Normal marrow signal. Sinuses/Orbits: Negative. Other: The mastoids are well aerated. IMPRESSION: Restricted diffusion and increased T2 signal, most prominent in the bilateral anterior temporal lobes, left greater than right medial temporal lobes, and left temporoparietal region, which is nonspecific but could be seen in the setting of viral or autoimmune encephalitis. Seizures can also appears similar. Electronically Signed   By: Merilyn Baba M.D.   On: 02/17/2021 18:13   DG Chest Port 1 View  Result Date: 02/17/2021 CLINICAL DATA:  Shortness of breath EXAM: PORTABLE CHEST 1 VIEW COMPARISON:  Portable exam 1823 hours compared to 01/02/2017 FINDINGS: Enlargement of cardiac silhouette with pulmonary vascular congestion. Decreased lung volumes versus previous exam with mild bibasilar atelectasis. Scattered interstitial prominence, question mild  pulmonary edema. No pleural effusion or pneumothorax. Bones demineralized. IMPRESSION: Enlargement of cardiac silhouette with pulmonary vascular congestion and interstitial prominence question pulmonary edema. Bibasilar atelectasis. Electronically Signed   By: Lavonia Dana M.D.   On: 02/17/2021 18:35   Overnight EEG with video  Result Date: 02/18/2021 Lora Havens, MD     02/18/2021  9:17 AM Patient Name: Robin Adams MRN: [id number] Epilepsy Attending: Lora Havens Referring Physician/Provider: Dr Lesleigh Noe Duration: 02/17/2021 2047 to 02/18/2021 0915  Patient history: 70 y.o. female who presented to the ED via EMS for evaluation of altered mental status with confusion, difficulty following commands, and nonverbal state after being found by her daughter altered this afternoon. EEG to evaluate for seizure  Level of alertness: Awake, asleep  AEDs during EEG study: None  Technical aspects: This EEG study was done with scalp electrodes positioned  according to the 10-20 International system of electrode placement. Electrical activity was acquired at a sampling rate of 500Hz  and reviewed with a high frequency filter of 70Hz  and a low frequency filter of 1Hz . EEG data were recorded continuously and digitally stored.  Description: The posterior dominant rhythm consists of 8 Hz activity of moderate voltage (25-35 uV) seen predominantly in posterior head regions, asymmetric ( left< right) and reactive to eye opening and eye closing. EEG showed continuous 3 to 6 Hz theta-delta slowing in left hemisphere, maximal left frontotemporal region. Lateralized periodic discharges were also noted in left hemisphere, maximal left frontotemporal region with fluctuation frequency between 0.5-1hz . Hyperventilation and photic stimulation were not performed.    ABNORMALITY - Lateralized periodic discharges, left hemisphere, maximal left frontotemporal region - Continuous slow, left hemisphere, maximal left frontotemporal  region - background asymmetry, left<right  IMPRESSION: This study showed evidence of epileptogenicity and cortical dysfunction arising from left hemisphere, maximal left frontotemporal region likely secondary to underlying structural abnormality. This eeg pattern is on the ictal -interictal continuum with high potential for seizures. No definite seizures were seen throughout the recording.  Priyanka Barbra Sarks   US THYROID  Result Date: 02/18/2021 CLINICAL DATA:  Thyroid lesion EXAM: THYROID ULTRASOUND TECHNIQUE: Ultrasound examination of the thyroid gland and adjacent soft tissues was performed. COMPARISON:  None. FINDINGS: Parenchymal Echotexture: Mildly heterogeneous Isthmus: 0.5 cm Right lobe: 3.4 x 1.3 x 1.8 cm Left lobe: 4.6 x 1.8 x 1.5 cm _________________________________________________________ Estimated total number of nodules >/= 1 cm: 2 Number of spongiform nodules >/=  2 cm not described below (TR1): 0 Number of mixed cystic and solid nodules >/= 1.5 cm not described below (TR2): 0 _________________________________________________________ Nodule # 1: Location: Isthmus; superior Maximum size: 3.1 cm; Other 2 dimensions: 2.6 x 2.0 cm Composition: mixed cystic and solid (1) Echogenicity: isoechoic (1) Shape: not taller-than-wide (0) Margins: smooth (0) Echogenic foci: none (0) ACR TI-RADS total points: 2. ACR TI-RADS risk category: TR2 (2 points). ACR TI-RADS recommendations: This nodule does NOT meet TI-RADS criteria for biopsy or dedicated follow-up. _________________________________________________________ 0.9 x 0.6 x 0.8 cm cystic nodule in the right superior thyroid lobe does not meet criteria for imaging surveillance or FNA. IMPRESSION: Isthmus and right thyroid nodules do not meet criteria for FNA or imaging surveillance. No suspicious thyroid nodules. The above is in keeping with the ACR TI-RADS recommendations - J Am Coll Radiol 2017;14:587-595. Electronically Signed   By: Miachel Roux M.D.   On:  02/18/2021 07:37   CT HEAD CODE STROKE WO CONTRAST  Result Date: 02/17/2021 CLINICAL DATA:  Code stroke. Neuro deficit, acute, stroke suspected. Confusion. EXAM: CT HEAD WITHOUT CONTRAST TECHNIQUE: Contiguous axial images were obtained from the base of the skull through the vertex without intravenous contrast. COMPARISON:  12/16/2011 FINDINGS: Brain: No acute finding by CT. Mild chronic small-vessel ischemic changes of the white matter in left basal ganglia. No identifiable acute infarction, mass lesion, hemorrhage, hydrocephalus or extra-axial collection. Vascular: There is atherosclerotic calcification of the major vessels at the base of the brain. Skull: Negative Sinuses/Orbits: Clear/normal Other: None ASPECTS ([place] Stroke Program Early CT Score) - Ganglionic level infarction (caudate, lentiform nuclei, internal capsule, insula, M1-M3 cortex): 7 - Supraganglionic infarction (M4-M6 cortex): 3 Total score (0-10 with 10 being normal): 10 IMPRESSION: 1. No acute finding by CT. Mild chronic small-vessel ischemic change of the cerebral hemispheric white matter and left basal ganglia. 2. These results were communicated to Dr. Curly Shores at 4:49 pm on 02/17/2021  by text page via the [facility] messaging system. 3. ASPECTS is 10 Electronically Signed   By: Nelson Chimes M.D.   On: 2021-02-23 16:50   CT ANGIO HEAD NECK W WO CM W PERF (CODE STROKE)  Result Date: 02-23-21 CLINICAL DATA:  Aphasia, confusion, stroke suspected EXAM: CT ANGIOGRAPHY HEAD AND NECK CT PERFUSION BRAIN TECHNIQUE: Multidetector CT imaging of the head and neck was performed using the standard protocol during bolus administration of intravenous contrast. Multiplanar CT image reconstructions and MIPs were obtained to evaluate the vascular anatomy. Carotid stenosis measurements (when applicable) are obtained utilizing NASCET criteria, using the distal internal carotid diameter as the denominator. Multiphase CT imaging of the brain was performed  following IV bolus contrast injection. Subsequent parametric perfusion maps were calculated using RAPID software. CONTRAST:  166mL OMNIPAQUE IOHEXOL 350 MG/ML SOLN COMPARISON:  2021-02-23 CT head codes stroke, 12-22-2011 CT head. FINDINGS: CT HEAD FINDINGS Please see same day CT head code stroke for noncontrast findings. CTA NECK FINDINGS Aortic arch: Standard branching. Imaged portion shows no evidence of aneurysm or dissection. No significant stenosis of the major arch vessel origins. Right carotid system: No evidence of dissection, stenosis (50% or greater) or occlusion. Left carotid system: No evidence of dissection, stenosis (50% or greater) or occlusion. Vertebral arteries: Codominant. No evidence of dissection, stenosis (50% or greater) or occlusion. Skeleton: No acute osseous abnormality. Other neck: Hypoattenuating lesion in the thyroid isthmus measures up to 1.5 x 1.9 x 2.8 cm. The neck is otherwise negative. Upper chest: Dependent atelectasis. No pleural effusion or focal pulmonary opacity. Review of the MIP images confirms the above findings CTA HEAD FINDINGS Anterior circulation: Both internal carotid arteries are patent to the termini, without stenosis or other abnormality. A1 segments patent. Normal anterior communicating artery. Anterior cerebral arteries are patent to their distal aspects. No M1 stenosis or occlusion. Normal MCA bifurcations. Distal MCA branches perfused and symmetric. Posterior circulation: Vertebral arteries widely patent to the vertebrobasilar junction without stenosis. Posterior inferior cerebral arteries patent bilaterally. Basilar patent to its distal aspect. Superior cerebral arteries patent bilaterally. Bilateral posterior communicating arteries are visualized. Diminutive right P1, with near fetal origin of the right PCA. Normal left P1. PCAs well perfused to their distal aspects without stenosis. Venous sinuses: As permitted by contrast timing, patent. Anatomic variants:  None significant Review of the MIP images confirms the above findings CT Brain Perfusion Findings: ASPECTS: 10 CBF (<30%) Volume: 45mL Perfusion (Tmax>6.0s) volume: 70mL Mismatch Volume: 17mL Infarction Location:None IMPRESSION: 1. No intracranial large vessel occlusion or hemodynamically significant stenosis. 2. No hemodynamically significant stenosis in the neck. 3. CT perfusion demonstrates no infarct core or penumbra. 4. Incidental note is made of hypoattenuating lesion in the thyroid isthmus, measuring up to 2.8 cm. If this has not previously been evaluated, an ultrasound of the thyroid is recommended. Electronically Signed   By: Merilyn Baba M.D.   On: 02-23-21 17:17    Labs:  CBC: Recent Labs    2021/02/23 1638 2021-02-23 1643 2021/02/23 1845 02/18/21 0345  WBC 13.4*  --   --  17.3*  HGB 14.9 15.6* 15.3* 13.8  HCT 46.1* 46.0 45.0 43.4  PLT 185  --   --  191    COAGS: Recent Labs    February 23, 2021 1638  INR 1.1  APTT 30    BMP: Recent Labs    02/23/2021 1638 Feb 23, 2021 1643 Feb 23, 2021 1845 02/18/21 0345  NA 139 141 140 136  K 3.8 3.6 3.5 3.7  CL 105  105  --  103  CO2 23  --   --  25  GLUCOSE 201* 194*  --  108*  BUN 15 16  --  11  CALCIUM 8.7*  --   --  8.1*  CREATININE 0.96 0.90  --  0.85  GFRNONAA >60  --   --  >60    LIVER FUNCTION TESTS: Recent Labs    02/17/21 1638 02/18/21 0345  BILITOT 0.3 0.5  AST 19 17  ALT 17 18  ALKPHOS 100 95  PROT 6.6 6.1*  ALBUMIN 3.3* 3.1*    TUMOR MARKERS: No results for input(s): AFPTM, CEA, CA199, CHROMGRNA in the last 8760 hours.  Assessment and Plan:  Acute encephalopathy: Robin Adams, 70 year old female, is tentatively scheduled 02/19/21 for an image-guided lumbar puncture under moderate sedation. This procedure/moderate sedation were discussed at the bedside with the patient's daughter. Consent has been signed by patient's daughter Robin Adams.  Risks and benefits discussed with the patient including bleeding, infection, damage to  adjacent structures and post-procedure headache.  All of the patient's questions were answered, patient is agreeable to proceed. She will be NPO at midnight.   Consent signed and in chart.   Thank you for this interesting consult.  I greatly enjoyed meeting Robin Adams and look forward to participating in their care.  A copy of this report was sent to the requesting provider on this date.  Electronically Signed: Soyla Dryer, AGACNP-BC [phone] 02/18/2021, 1:36 PM   I spent a total of 20 Minutes    in face to face in clinical consultation, greater than 50% of which was counseling/coordinating care for image-guided lumbar puncture with moderate sedation.

## 2021-02-18 NOTE — Plan of Care (Signed)
Pt is soft wrist restraints. Pt still attempting to bite at IV and O2 sensor. Pt restless. Pt did rest for about 30 min and then woke up restless again. Oral care provided. Daughter called and informed of events and pts status.   Problem: Safety: Goal: Non-violent Restraint(s) Outcome: Progressing

## 2021-02-18 NOTE — Procedures (Addendum)
Patient Name: Robin Adams  MRN: [id number]  Epilepsy Attending: Lora Havens  Referring Physician/Provider: Dr Lesleigh Noe Duration: 02/17/2021 2047 to 02/18/2021 1349   Patient history: 70 y.o. female who presented to the ED via EMS for evaluation of altered mental status with confusion, difficulty following commands, and nonverbal state after being found by her daughter altered this afternoon. EEG to evaluate for seizure   Level of alertness: Awake, asleep   AEDs during EEG study: None   Technical aspects: This EEG study was done with scalp electrodes positioned according to the 10-20 International system of electrode placement. Electrical activity was acquired at a sampling rate of 500Hz  and reviewed with a high frequency filter of 70Hz  and a low frequency filter of 1Hz . EEG data were recorded continuously and digitally stored.    Description: The posterior dominant rhythm consists of 8 Hz activity of moderate voltage (25-35 uV) seen predominantly in posterior head regions, asymmetric ( left< right) and reactive to eye opening and eye closing. EEG showed continuous 3 to 6 Hz theta-delta slowing in left hemisphere, maximal left frontotemporal region. Lateralized periodic discharges were also noted in left hemisphere, maximal left frontotemporal region with fluctuation frequency between 0.5-1hz . Hyperventilation and photic stimulation were not performed.      ABNORMALITY - Lateralized periodic discharges, left hemisphere, maximal left frontotemporal region - Continuous slow, left hemisphere, maximal left frontotemporal region - background asymmetry, left<right   IMPRESSION: This study showed evidence of epileptogenicity and cortical dysfunction arising from left hemisphere, maximal left frontotemporal region likely secondary to underlying structural abnormality. This eeg pattern is on the ictal -interictal continuum with high potential for seizures. No definite seizures were seen  throughout the recording.   Mattheo Swindle Barbra Sarks

## 2021-02-18 NOTE — Progress Notes (Signed)
Pt agitated, restless. Pt pulling of oxygen, O2 sensor, pulled out IV, pt spitting at staffing and coughed up mucous on nurse. Pt hooked to EEG. Pt noted biting at IV. Punching at staff. Provider paged to inform. Provider paged to inform concerns for pt safety. Received order for ativan and bilateral soft wrist restraints. No distress noted.

## 2021-02-19 ENCOUNTER — Inpatient Hospital Stay (HOSPITAL_COMMUNITY): Payer: Medicare Other

## 2021-02-19 DIAGNOSIS — G0490 Encephalitis and encephalomyelitis, unspecified: Secondary | ICD-10-CM | POA: Diagnosis not present

## 2021-02-19 DIAGNOSIS — R569 Unspecified convulsions: Secondary | ICD-10-CM | POA: Diagnosis not present

## 2021-02-19 DIAGNOSIS — G9341 Metabolic encephalopathy: Secondary | ICD-10-CM | POA: Diagnosis not present

## 2021-02-19 DIAGNOSIS — B004 Herpesviral encephalitis: Secondary | ICD-10-CM | POA: Diagnosis not present

## 2021-02-19 DIAGNOSIS — I10 Essential (primary) hypertension: Secondary | ICD-10-CM | POA: Diagnosis not present

## 2021-02-19 HISTORY — PX: IR INJECT/THERA/INC NEEDLE/CATH/PLC EPI/LUMB/SAC W/IMG: IMG6130

## 2021-02-19 LAB — GLUCOSE, CAPILLARY
Glucose-Capillary: 97 mg/dL (ref 70–99)
Glucose-Capillary: 96 mg/dL (ref 70–99)

## 2021-02-19 LAB — CBC
HCT: 41.5 % (ref 36.0–46.0)
Hemoglobin: 13.4 g/dL (ref 12.0–15.0)
MCH: 28.1 pg (ref 26.0–34.0)
MCHC: 32.3 g/dL (ref 30.0–36.0)
MCV: 87 fL (ref 80.0–100.0)
Platelets: 171 10*3/uL (ref 150–400)
RBC: 4.77 MIL/uL (ref 3.87–5.11)
RDW: 14 % (ref 11.5–15.5)
WBC: 11.2 10*3/uL — ABNORMAL HIGH (ref 4.0–10.5)
nRBC: 0 % (ref 0.0–0.2)

## 2021-02-19 LAB — CSF CELL COUNT WITH DIFFERENTIAL
RBC Count, CSF: 104 /mm3 — ABNORMAL HIGH
RBC Count, CSF: 5 /mm3 — ABNORMAL HIGH
Tube #: 1
Tube #: 4
WBC, CSF: 3 /mm3 (ref 0–5)
WBC, CSF: 2 /mm3 (ref 0–5)

## 2021-02-19 LAB — HSV(HERPES SIMPLEX VRS) I + II AB-IGG
HSV 1 Glycoprotein G Ab, IgG: <0.91 index (ref 0.00–0.90)
HSV 2 Glycoprotein G Ab, IgG: <0.91 index (ref 0.00–0.90)

## 2021-02-19 LAB — BASIC METABOLIC PANEL
Anion gap: 7 (ref 5–15)
BUN: 8 mg/dL (ref 8–23)
CO2: 24 mmol/L (ref 22–32)
Calcium: 8 mg/dL — ABNORMAL LOW (ref 8.9–10.3)
Chloride: 107 mmol/L (ref 98–111)
Creatinine, Ser: 0.86 mg/dL (ref 0.44–1.00)
GFR, Estimated: >60 mL/min (ref >60)
Glucose, Bld: 95 mg/dL (ref 70–99)
Potassium: 3.3 mmol/L — ABNORMAL LOW (ref 3.5–5.1)
Sodium: 138 mmol/L (ref 135–145)

## 2021-02-19 LAB — EPSTEIN-BARR VIRUS VCA, IGM: EBV VCA IgM: <36 U/mL (ref 0.0–35.9)

## 2021-02-19 LAB — PROTEIN, CSF: Total  Protein, CSF: 38 mg/dL (ref 15–45)

## 2021-02-19 LAB — CMV IGM: CMV IgM: <30 AU/mL (ref 0.0–29.9)

## 2021-02-19 LAB — EPSTEIN-BARR VIRUS VCA, IGG: EBV VCA IgG: >600 U/mL — ABNORMAL HIGH (ref 0.0–17.9)

## 2021-02-19 LAB — T3: T3, Total: 106 ng/dL (ref 71–180)

## 2021-02-19 LAB — CRYPTOCOCCAL ANTIGEN, CSF: Crypto Ag: NEGATIVE

## 2021-02-19 LAB — GLUCOSE, CSF: Glucose, CSF: 52 mg/dL (ref 40–70)

## 2021-02-19 LAB — MAGNESIUM: Magnesium: 1.9 mg/dL (ref 1.7–2.4)

## 2021-02-19 IMAGING — MR MR HEAD W/ CM
2 of 4 series · 14 of 48 positions shown · IV contrast (Yes   gad)
Comparison: [DATE].

CLINICAL DATA: Seizure, abnormal neuro exam

EXAM:
MRI HEAD WITH CONTRAST
TECHNIQUE: Multiplanar, multiecho pulse sequences of the brain and surrounding
structures were obtained with intravenous contrast.
CONTRAST:  10mL GADAVIST GADOBUTROL 1 MMOL/ML IV SOLN

[Series 4: T2 post-contrast · coronal · 5.0mm · 0.39mm/px · 7 of 29 slices shown]
[im 1/29]
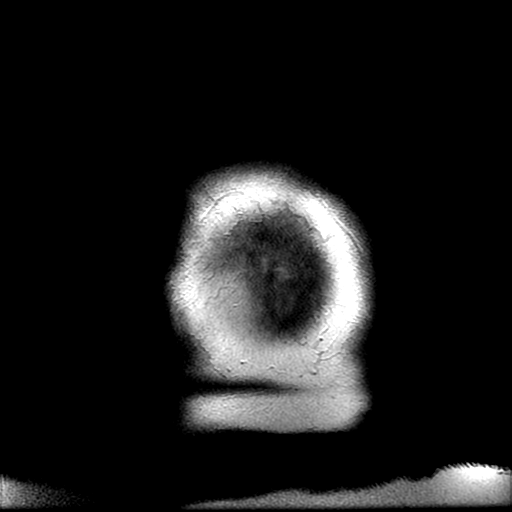
[im 5/29]
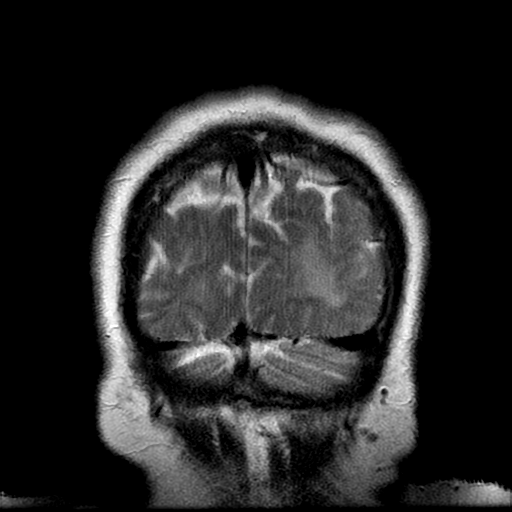
[im 10/29]
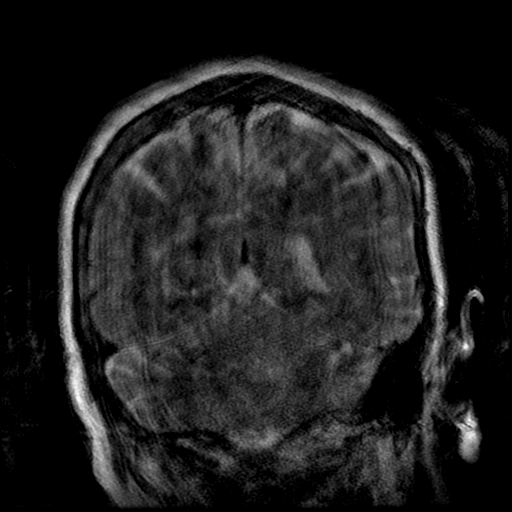
[im 15/29]
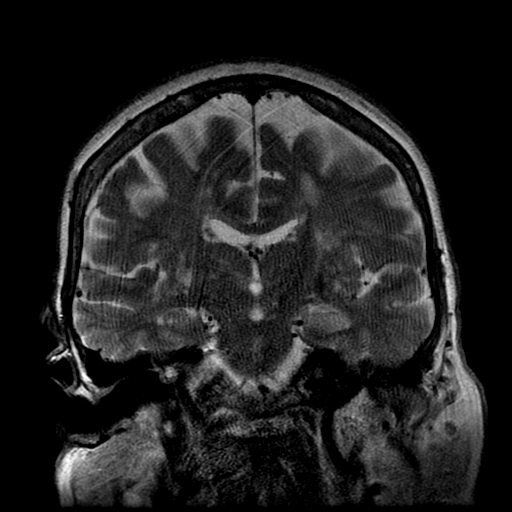
[im 19/29]
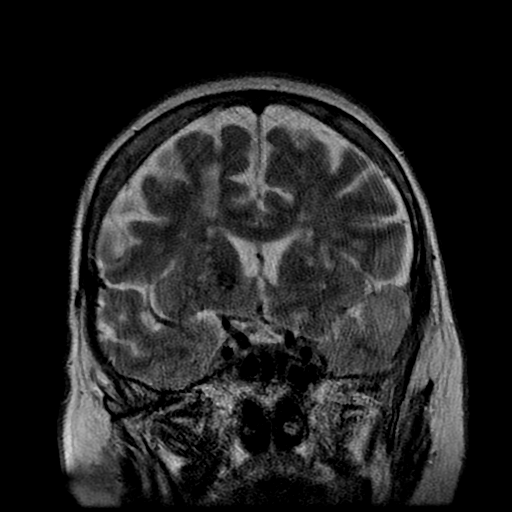
[im 24/29]
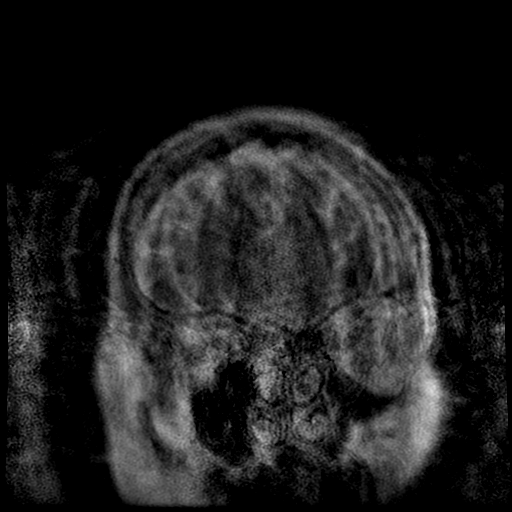
[im 29/29]
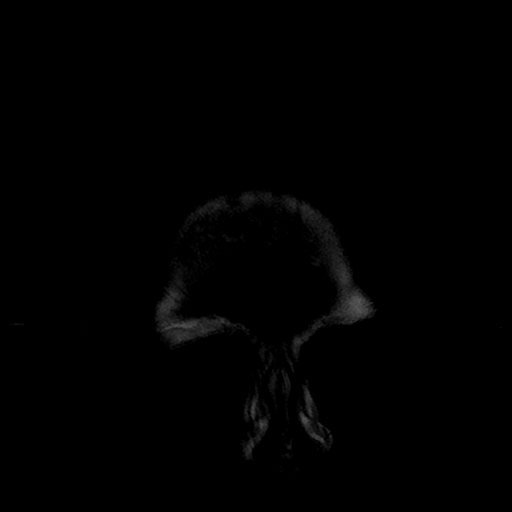

[Series 7: T1 post-contrast · coronal · 5.0mm · 0.43mm/px · 7 of 29 slices shown]
[im 1/29]
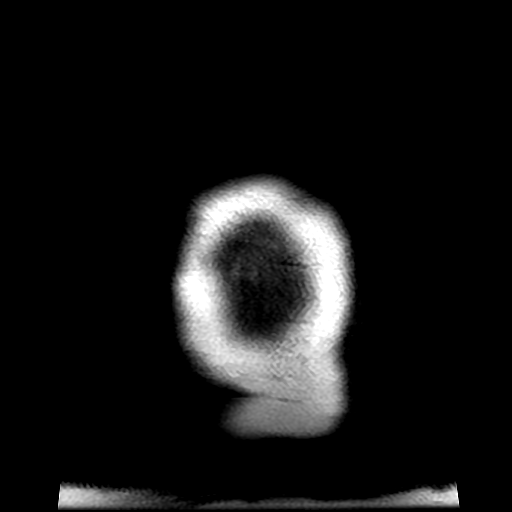
[im 5/29]
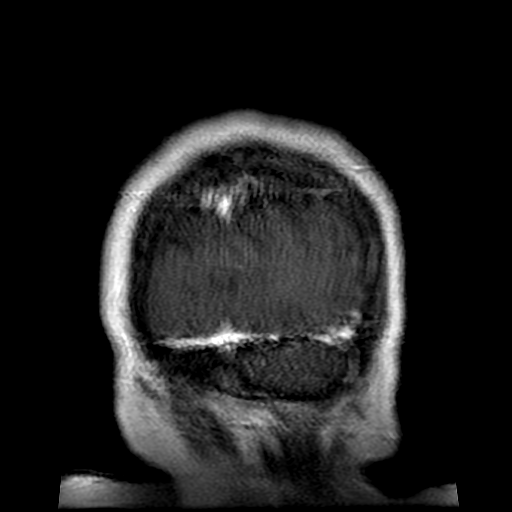
[im 10/29]
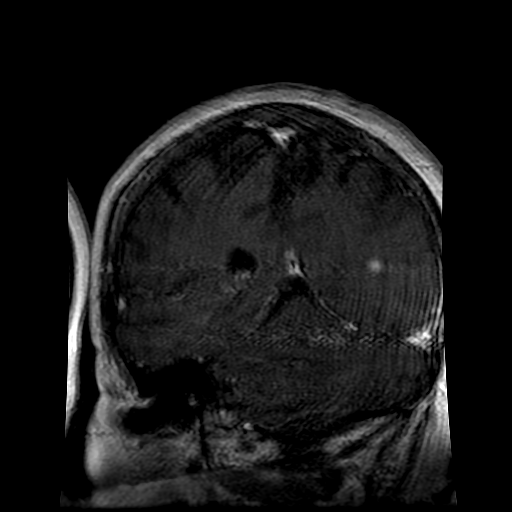
[im 15/29]
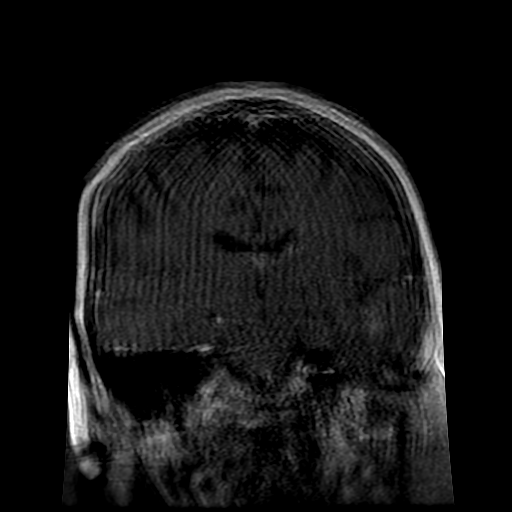
[im 19/29]
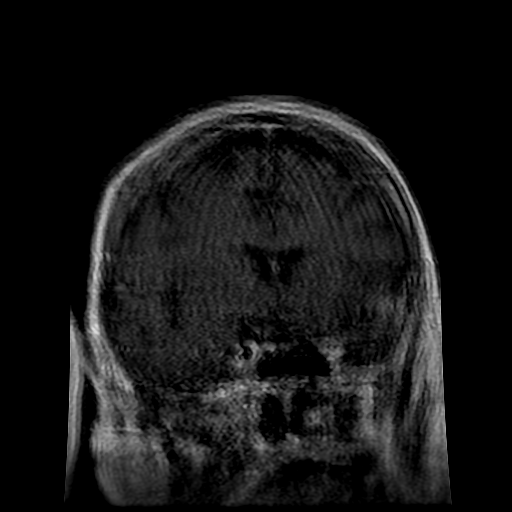
[im 24/29]
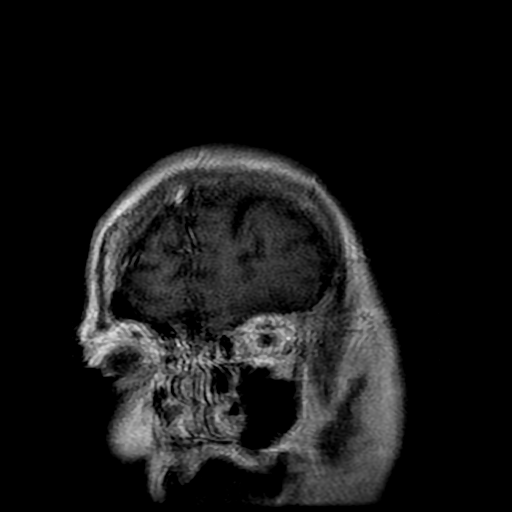
[im 29/29]
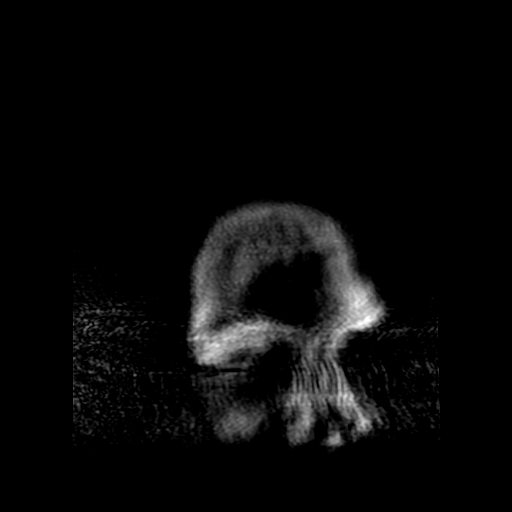

[14 of 48 positions shown; findings below may reference images not displayed]

FINDINGS: Brain: In the left anterior temporal lobe, there is an area of
enhancement that measures approximately 2.6 x 1.3 x 1.3 cm (series
8, image 19 and series 17, image 4), which correlates with an area
of increased signal on diffusion-weighted imaging and increased T2
signal on the prior exam.

Another area of contrast enhancement is noted in the left posterior
temporal/occipital region (series 8, image 25 and series 7, image
7), measuring up to 1.8 x 0.6 x 0.6 cm. This also correlates with an
area of increased T2 signal and increased signal on
diffusion-weighted imaging on the prior exam.

Possible 0.7 cm area of contrast enhancement in the left pons
(series 6, image 13), although this area is poorly seen on the
coronal for corroboration. This does not correlate with abnormal
signal on the prior exam.

Vascular: Unable to evaluate

Skull and upper cervical spine: Unable to evaluate.

Sinuses/Orbits: Limited evaluation, appears normal.

Other: None.
IMPRESSION: Evaluation is limited by motion artifact. Within this limitation,
there is contrast enhancement in the left anterior temporal lobe and
left temporoparietal region, which correlates with areas of
restricted diffusion and increased T2 signal seen on the prior MRI.
An additional area of possible contrast enhancement in the left pons
does not correlate with abnormal signal seen on the prior exam; this
may be unrelated or indicate an additional area of abnormality that
developed since the prior exam.

This appearance remains concerning for autoimmune or viral
encephalitis, including herpes encephalitis. Vasculitis is also in
the differential. Seizures or subacute infarcts are felt to be less
likely.

## 2021-02-19 IMAGING — CT CT CHEST-ABD-PELV W/ CM
3 of 5 series · 15 of 36 positions shown, 17 images · IV contrast (Omni 300)
Comparison: Ultrasound thyroid [DATE]

CLINICAL DATA: Encephalopathy, AMS. Concern for malignancy

EXAM:
CT CHEST, ABDOMEN, AND PELVIS WITH CONTRAST
TECHNIQUE: Multidetector CT imaging of the chest, abdomen and pelvis was
performed following the standard protocol during bolus
administration of intravenous contrast.
CONTRAST:  100mL OMNIPAQUE IOHEXOL 300 MG/ML  SOLN

[Series 3: cap with 5mm st · axial · 0.98mm/px · z∈[+1052,+1548]mm · 9 of 125 slices shown, 11 images]
[im 13/125  mediastinal]
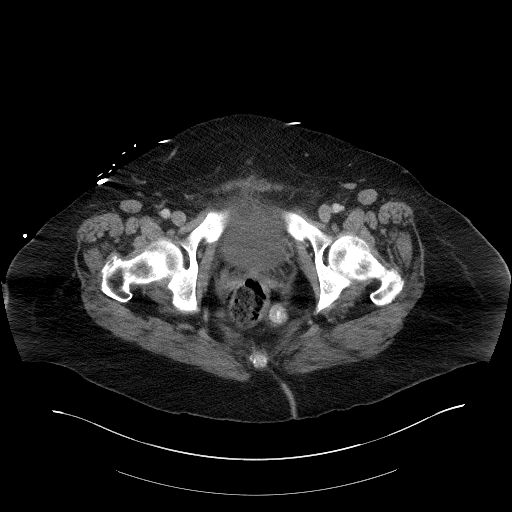
[im 13/125  bone]
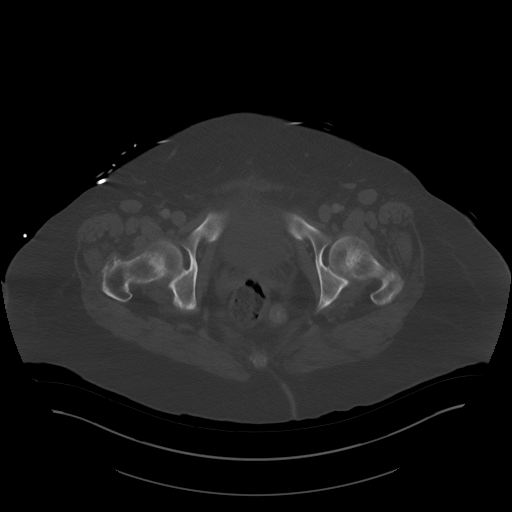
[im 25/125  mediastinal]
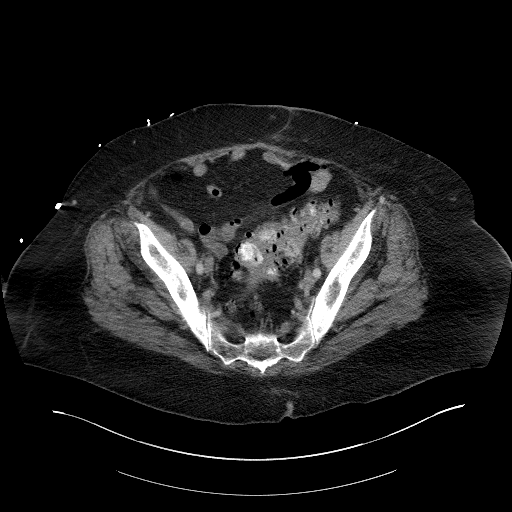
[im 38/125  mediastinal]
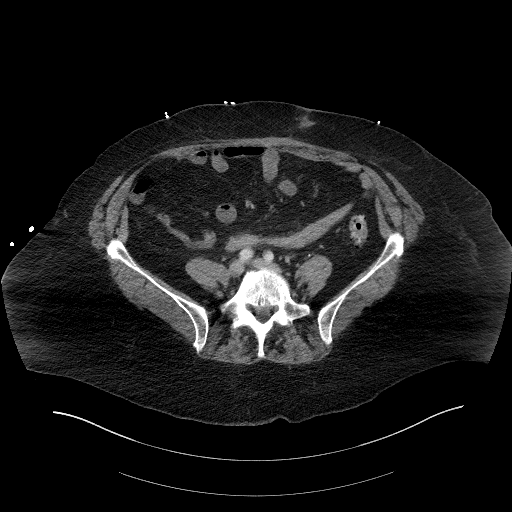
[im 50/125  mediastinal]
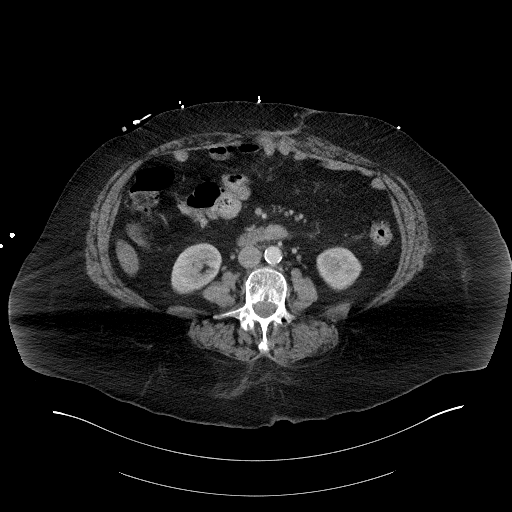
[im 63/125  mediastinal]
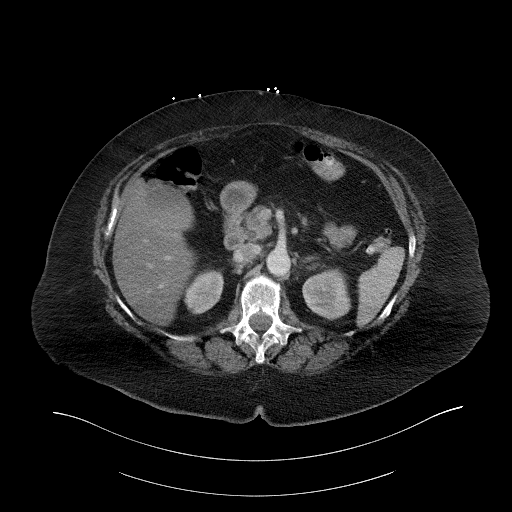
[im 75/125  mediastinal]
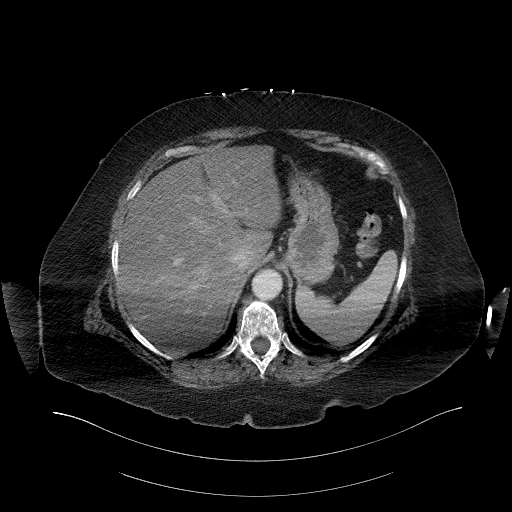
[im 87/125  mediastinal]
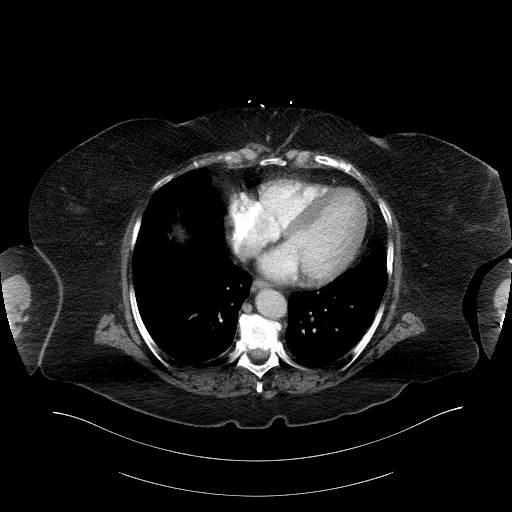
[im 100/125  mediastinal]
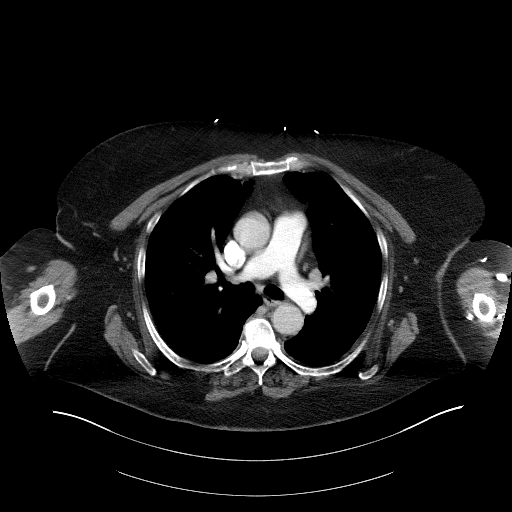
[im 112/125  mediastinal]
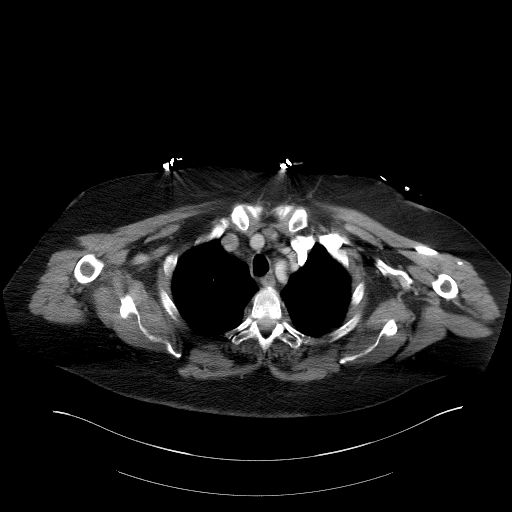
[im 112/125  bone]
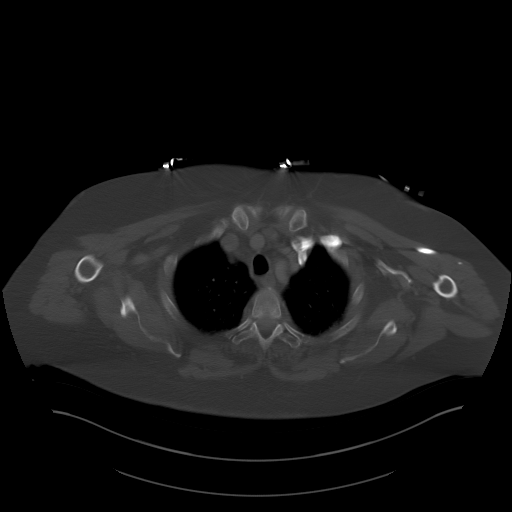

[Series 4: lung · axial · 0.97mm/px · z∈[+1330,+1400]mm · 3 of 153 slices shown]
[im 12/153  bone]
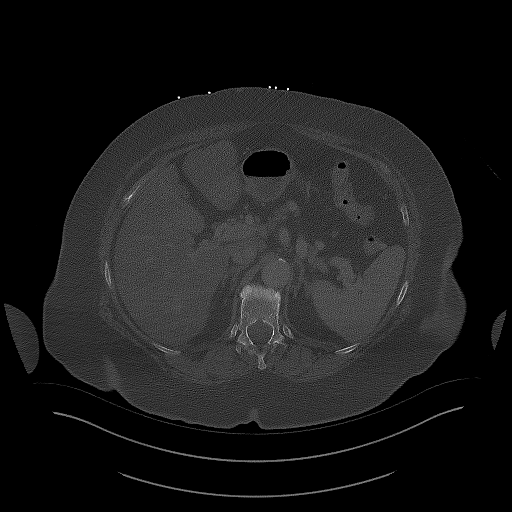
[im 36/153  bone]
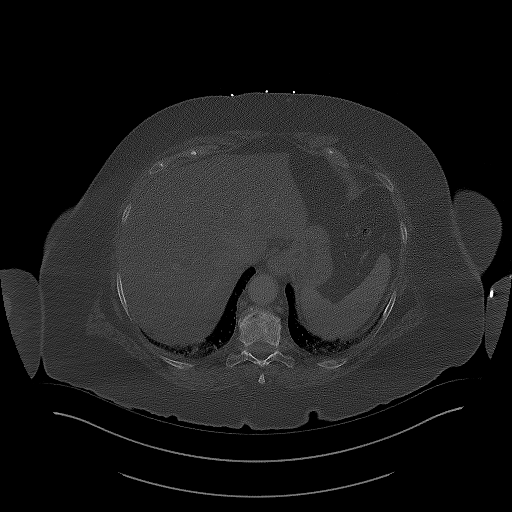
[im 47/153  bone]
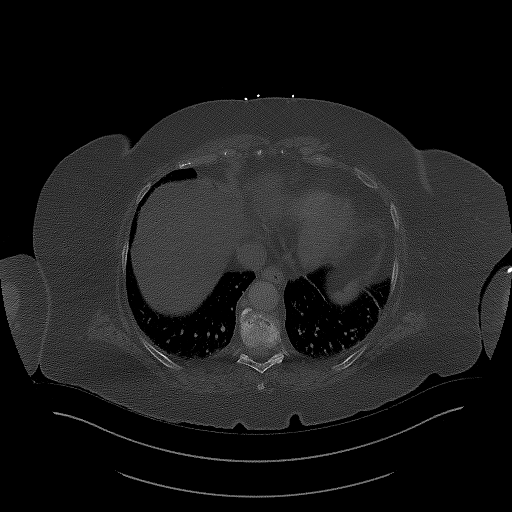

[Series 5: cap with 3mm st cor · coronal · 0.98mm/px · 3 of 138 slices shown]
[im 28/138  mediastinal]
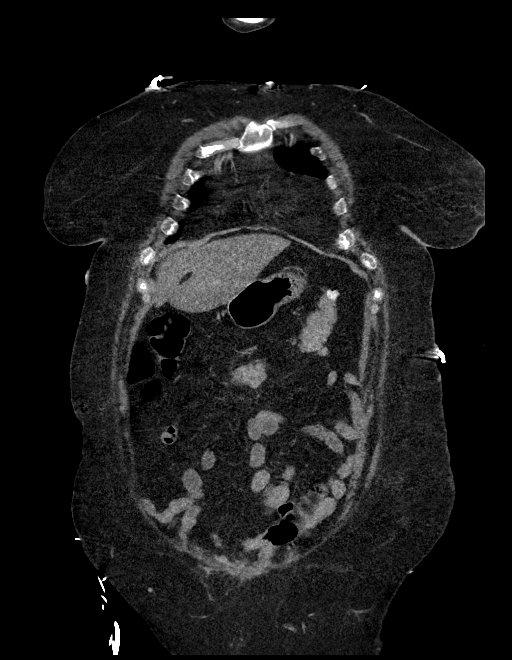
[im 55/138  mediastinal]
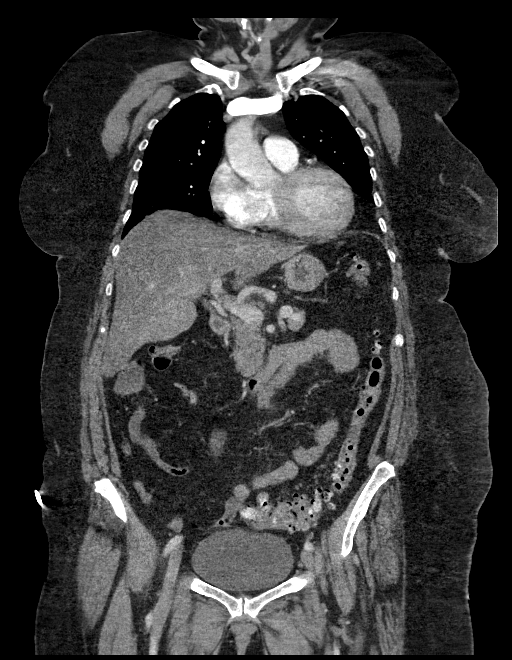
[im 83/138  mediastinal]
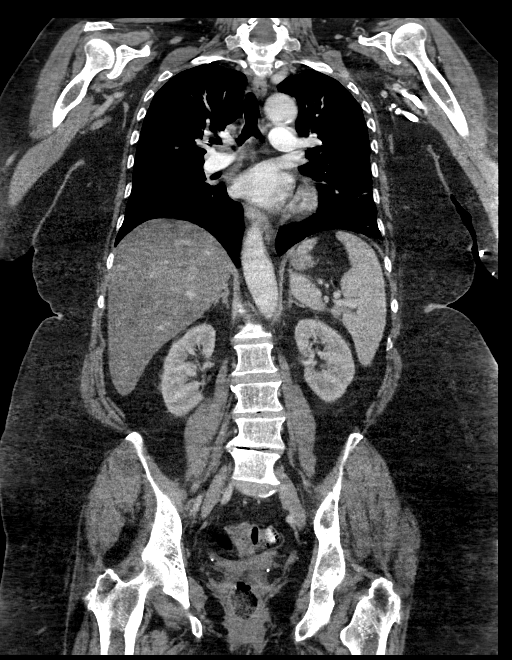

[15 of 36 positions shown; findings below may reference images not displayed]

FINDINGS: CT CHEST FINDINGS

Cardiovascular: Normal heart size. No significant pericardial
effusion. The thoracic aorta is normal in caliber. Mild
atherosclerotic plaque of the thoracic aorta. At least 1 vessel
coronary artery calcifications. The main pulmonary artery is normal
in caliber. No central pulmonary embolus.

Mediastinum/Nodes:No enlarged mediastinal, hilar, or axillary lymph
nodes. Trachea and esophagus demonstrate no significant findings.
Tiny hiatal hernia.

There is a 1.6 cm thyroid/isthmus hypodensity. This has been
evaluated on previous imaging (ref: [HOSPITAL]. [DATE]): 143-50).

Lungs/Pleura: Thin wall cystic lesion likely benign. No focal
consolidation. No pulmonary nodule. No pulmonary mass. No pleural
effusion. No pneumothorax.

Musculoskeletal:

No chest wall abnormality.

No suspicious lytic or blastic osseous lesions. No acute displaced
fracture. Multilevel degenerative changes of the spine.

CT ABDOMEN PELVIS FINDINGS

Hepatobiliary: The hepatic parenchyma is diffusely hypodense
compared to the splenic parenchyma consistent with fatty
infiltration. Subcentimeter hypodensity too small to characterize.
Otherwise no focal liver abnormality. No gallstones, gallbladder
wall thickening, or pericholecystic fluid. No biliary dilatation.

Pancreas: Diffusely atrophic. No focal lesion. Otherwise normal
pancreatic contour. No surrounding inflammatory changes. No main
pancreatic ductal dilatation.

Spleen: Normal in size without focal abnormality.

Adrenals/Urinary Tract:

No adrenal nodule bilaterally.

Bilateral kidneys enhance symmetrically.

No hydronephrosis. No hydroureter.

The urinary bladder is unremarkable.

On delayed imaging, there is no urothelial wall thickening and there
are no filling defects in the opacified portions of the bilateral
collecting systems or ureters.

Stomach/Bowel: Stomach is within normal limits. No evidence of bowel
wall thickening or dilatation. No pneumatosis. Diffuse descending
colon and sigmoid colonic diverticulosis. The appendix not
definitely identified.

Vascular/Lymphatic: No abdominal aorta or iliac aneurysm. Mild
atherosclerotic plaque of the aorta and its branches. No abdominal,
pelvic, or inguinal lymphadenopathy.

Reproductive: Status post hysterectomy. No adnexal masses.

Other: No intraperitoneal free fluid. No intraperitoneal free gas.
No organized fluid collection.

Musculoskeletal:

Very shallow umbilical hernia containing a short loop of small
bowel.

No suspicious lytic or blastic osseous lesions. No acute displaced
fracture. Multilevel degenerative changes of the spine.
IMPRESSION: 1. No acute intrathoracic abnormality.
2. Hepatic steatosis.
3. Diffuse descending colon and sigmoid colonic diverticulosis with
no definite acute diverticulitis.
4. Very shallow umbilical hernia containing a short loop of small
bowel. No findings of associated bowel obstruction or findings to
suggest ischemia.

## 2021-02-19 IMAGING — XA Imaging study
2 series · 2 of 2 positions shown · non-contrast
Comparison: None available.

CLINICAL DATA: 70-year-old female with concern for meningitis.

EXAM:
DIAGNOSTIC LUMBAR PUNCTURE UNDER FLUOROSCOPIC GUIDANCE

[Series 1: fl (-) angio · 1 of 1 slices shown (1 of 2)]
[im 1/1]
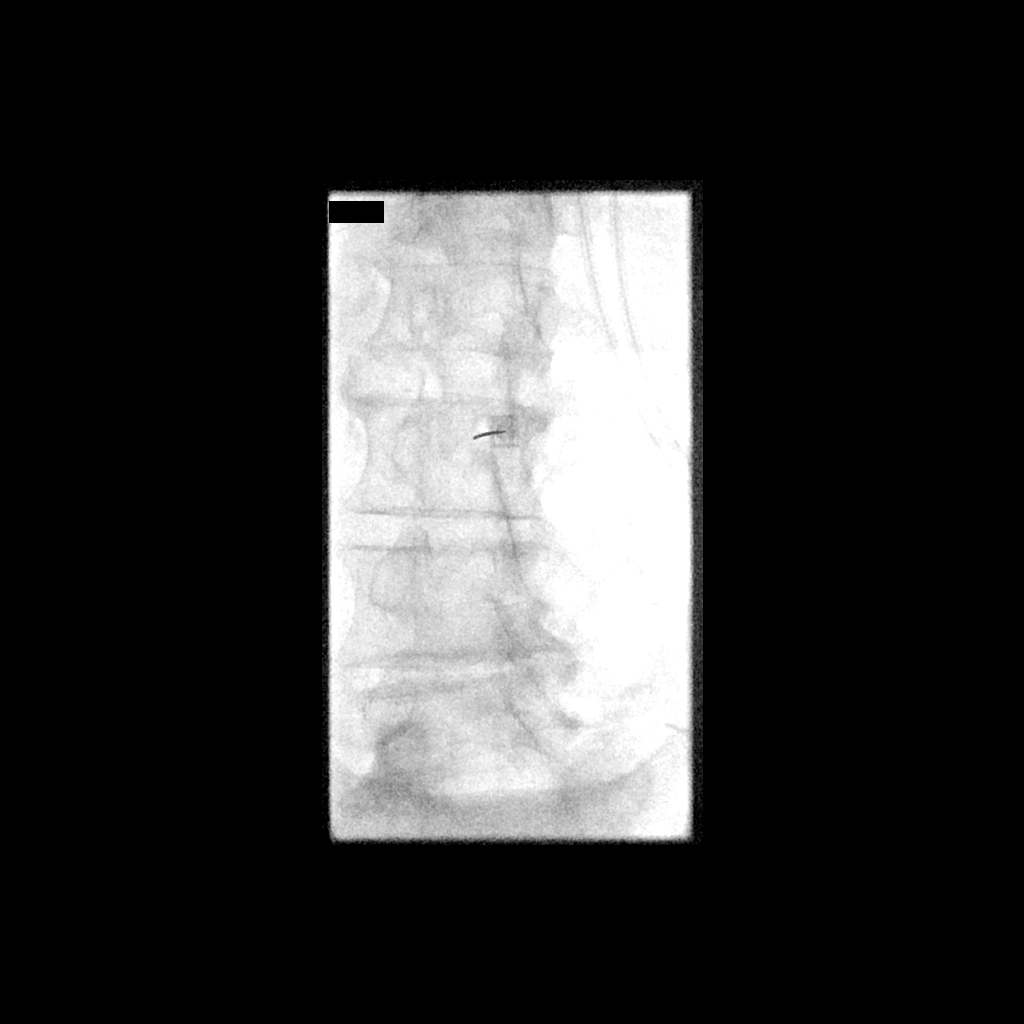

[Series 2: fl (-) angio · 1 of 1 slices shown (2 of 2)]
[im 1/1]
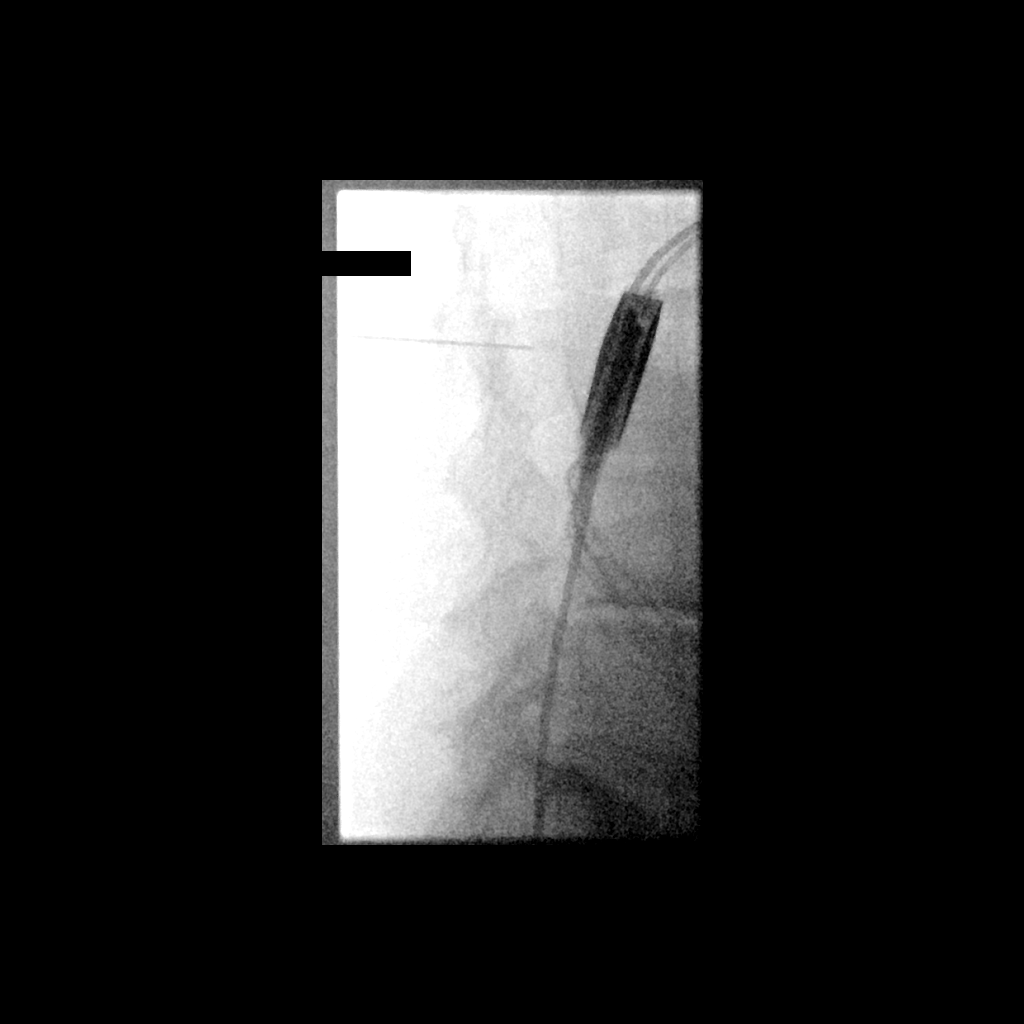

[2 of 2 positions shown; findings below may reference images not displayed]

FLUOROSCOPY TIME:  Fluoroscopy Time:  1 minute, 6 seconds

Radiation Exposure Index (if provided by the fluoroscopic device):
17 mGy

Number of Acquired Spot Images: 1

SEDATION:
Moderate (conscious) sedation was employed during this procedure. A
total of Versed 2 mg and Fentanyl 75 mcg was administered
intravenously.

Moderate Sedation Time: 21 minutes. The patient's level of
consciousness and vital signs were monitored continuously by
radiology nursing throughout the procedure under my direct
supervision.

PROCEDURE:
Informed consent was obtained from the patient's daughter prior to
the procedure, including potential complications of headache,
allergy, and pain. With the patient prone, the lower back was
prepped with Betadine. 1% Lidocaine was used for local anesthesia.
Lumbar puncture was performed at the L2-L3 level using a 20 gauge
needle with return of clear CSF with an opening pressure of 22 cm
water. Total of 28 ml of CSF were obtained for laboratory studies.
The patient tolerated the procedure well and there were no apparent
complications.
IMPRESSION: Technically successful left interlaminar L2-L3 lumbar puncture.

## 2021-02-19 MED ORDER — MIDAZOLAM HCL 2 MG/2ML IJ SOLN
INTRAMUSCULAR | Status: AC
  Filled 2021-02-19: qty 4

## 2021-02-19 MED ORDER — MIDAZOLAM HCL 2 MG/2ML IJ SOLN
INTRAMUSCULAR | Status: AC | PRN
  Administered 2021-02-19 (×2): 1 mg via INTRAVENOUS

## 2021-02-19 MED ORDER — LIDOCAINE-EPINEPHRINE 1 %-1:100000 IJ SOLN
INTRAMUSCULAR | Status: AC
  Filled 2021-02-19: qty 1

## 2021-02-19 MED ORDER — FENTANYL CITRATE (PF) 100 MCG/2ML IJ SOLN
INTRAMUSCULAR | Status: AC | PRN
  Administered 2021-02-19: 50 ug via INTRAVENOUS
  Administered 2021-02-19: 25 ug via INTRAVENOUS

## 2021-02-19 MED ORDER — IOHEXOL 300 MG/ML  SOLN
100.0000 mL | Freq: Once | INTRAMUSCULAR | Status: AC | PRN
  Administered 2021-02-19: 100 mL via INTRAVENOUS

## 2021-02-19 MED ORDER — HYDRALAZINE HCL 20 MG/ML IJ SOLN
10.0000 mg | Freq: Four times a day (QID) | INTRAMUSCULAR | Status: DC | PRN
  Administered 2021-02-20 – 2021-02-21 (×2): 10 mg via INTRAVENOUS
  Filled 2021-02-19 (×2): qty 1

## 2021-02-19 MED ORDER — LIDOCAINE HCL 1 % IJ SOLN
INTRAMUSCULAR | Status: AC
  Filled 2021-02-19: qty 20

## 2021-02-19 MED ORDER — FENTANYL CITRATE (PF) 100 MCG/2ML IJ SOLN
INTRAMUSCULAR | Status: AC
  Filled 2021-02-19: qty 2

## 2021-02-19 MED ORDER — LORAZEPAM 2 MG/ML IJ SOLN: 1.0000 mg | INTRAMUSCULAR | Status: AC

## 2021-02-19 MED ORDER — POTASSIUM CHLORIDE 10 MEQ/100ML IV SOLN
10.0000 meq | INTRAVENOUS | Status: AC
  Administered 2021-02-19 (×4): 10 meq via INTRAVENOUS
  Filled 2021-02-19 (×4): qty 100

## 2021-02-19 MED ORDER — GADOBUTROL 1 MMOL/ML IV SOLN
10.0000 mL | Freq: Once | INTRAVENOUS | Status: AC | PRN
  Administered 2021-02-19: 10 mL via INTRAVENOUS

## 2021-02-19 NOTE — Progress Notes (Signed)
RN noted at bedside removing pt's restraints. Patient noted alert and not combative or removing any lines at this time. RN explained restraints orders and notified MD to see if possibly removing restraints while dgt is here and per dgt, she will be here all day. Will continue to monitor.

## 2021-02-19 NOTE — Progress Notes (Signed)
SLP Cancellation Note  Patient Details Name: Robin Adams MRN: [id number] DOB: Feb 07, 1951   Cancelled treatment:        Attempted to see pt for clinical swallow evaluation.  Pt is presently NPO for LP in radiology scheduled for this afternoon.  SLP will reattempt as schedule allows when pt is appropriate for PO trials.    Celedonio Savage, [place], [place] Office: [phone] 02/19/2021, 10:46 AM

## 2021-02-19 NOTE — Sedation Documentation (Signed)
Lumbar puncture opening pressure 22.

## 2021-02-19 NOTE — Procedures (Signed)
Interventional Radiology Procedure Note  Procedure:  Fluoroscopic guided lumbar puncture  Findings: Please refer to procedural dictation for full description.  20 ga spinal needle, left interlaminar approach at L2-L3.  Opening CSF pressure = 22 cm H20.  Total of 28 mL clear CSF removed and sent to lab.  Complications: None immediate  Estimated Blood Loss: < 5 mL  Recommendations: Bedrest/flat remainder of day as tolerated. Follow up labs.   Ruthann Cancer, MD Pager: [phone]

## 2021-02-19 NOTE — Progress Notes (Addendum)
[facility] PROGRESS NOTE   Robin Adams OBS:[id number] DOB: 1951/01/19 DOA: 02/17/2021  PCP: Mar Daring, PA-C  Brief History/Interval Summary: 70 y.o. female with medical history significant of HTN, ovarian cystic masses s/p hysterectomy and bilateral oophorectomy, obesity, and tobacco dependence who had not been feeling well for several days prior to admission.  Patient appeared to be very confused to family members.  Subsequently she was brought into the emergency department for further evaluation.  Initial assessment raise concern for meningitis as well as seizure activity.  She was hospitalized for further management.  Reason for Visit: Concern for herpes encephalitis versus meningitis/seizure activity  Consultants: Neurology  Procedures:  Lumbar puncture to be attempted today by interventional radiology under moderate sedation  EEG ABNORMALITY - Lateralized periodic discharges, left hemisphere, maximal left frontotemporal region - Continuous slow, left hemisphere, maximal left frontotemporal region - background asymmetry, left<right   IMPRESSION: This study showed evidence of epileptogenicity and cortical dysfunction arising from left hemisphere, maximal left frontotemporal region likely secondary to underlying structural abnormality. This eeg pattern is on the ictal -interictal continuum with high potential for seizures. No definite seizures were seen throughout the recording.    Subjective/Interval History: Patient noted to be sleepy this morning.  Was reevaluated after few minutes when she was more awake.  Still appears to be distracted though more responsive compared to yesterday.  Still not answering all questions appropriately.   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  MRI was done which showed abnormal findings in bilateral temporal lobes.  Seizure activity was also noted  on EEG.   LP was attempted under fluoroscopy but due to agitation could not be done.  Plan is for LP to be done today under moderate sedation by interventional radiology.  Neurology continues to follow.  MRI brain with contrast has been ordered along with a CT scan of chest abdomen pelvis to evaluate for occult malignancy.  Seizure activity noted on EEG. Patient was started on Keppra which she seems to be tolerating well.   Patient remains on broad-spectrum antibiotics with ampicillin, ceftriaxone and vancomycin.  This does not appear to be a bacterial meningitis but will wait till LP is done and Gram staining is available.  Remains on acyclovir.  History of essential hypertension Was on amlodipine prior to admission.  Held here due to high risk of aspiration.  Hydralazine as needed.  Mentation seems to be improving.  Will request speech therapy to evaluate.    Hyperglycemia HbA1c 5.8.  Glucose levels are stable.  No need to check glucose levels on a daily basis.  Hypokalemia Replace potassium.  Magnesium is 1.5  Thyroid nodule TSH and free T4 levels are normal.  Thyroid ultrasound was done and no concerning features identified in the thyroid nodules.  No indication for biopsy based on the thyroid ultrasound report.  Outpatient monitoring.  Tobacco abuse Will need counseling when she is more oriented.  Prolonged QT interval Avoid QT prolonging medications.  Monitor on telemetry.  EKG repeated this morning does not show QT progression.  Cardiomegaly Noted on chest x-ray.  Echocardiogram shows normal systolic function.  No significant valvular abnormalities noted.  Obesity Estimated body mass index is 40.56 kg/m as calculated from the following:   Height as of this encounter: 5\' 6"  (1.676 m).   Weight as of this encounter: 114 kg.   DVT Prophylaxis: SCDs Code Status: Full code Family Communication: No family at bedside Disposition Plan: To  be determined  Status is:  Inpatient  Remains inpatient appropriate because: Altered mental status, need for IV antibiotics,    Medications: Scheduled:  insulin aspart  0-9 Units Subcutaneous Q4H   LORazepam  1 mg Intravenous On Call   nicotine  14 mg Transdermal Daily   sodium chloride flush  3 mL Intravenous Once   Continuous:  sodium chloride 75 mL/hr at 02/19/21 0750   acyclovir (ZOVIRAX) [phone] mg IVPB 800 mg (02/19/21 0513)   ampicillin (OMNIPEN) IV 2 g (02/19/21 0753)   cefTRIAXone (ROCEPHIN)  IV 2 g (02/18/21 2011)   levETIRAcetam 1,500 mg (02/19/21 0820)   potassium chloride 10 mEq (02/19/21 0750)   vancomycin 1,250 mg (02/19/21 0038)   GUY:[id number] **OR** acetaminophen, LORazepam  Antibiotics: Anti-infectives (From admission, onward)    Start     Dose/Rate Route Frequency Ordered Stop   02/18/21 2200  acyclovir (ZOVIRAX) 800 mg in dextrose 5 % 150 mL IVPB        800 mg 166 mL/hr over 60 Minutes Intravenous Every 8 hours 02/18/21 0911     02/18/21 1300  vancomycin (VANCOREADY) IVPB 1250 mg/250 mL        1,250 mg 166.7 mL/hr over 90 Minutes Intravenous Every 12 hours 02/18/21 0926     02/18/21 0415  vancomycin (VANCOCIN) 1,710 mg in sodium chloride 0.9 % 500 mL IVPB  Status:  Discontinued       Note to Pharmacy: Maximum dose of 3000mg .  See Hyperspace for full Linked Orders Report.   15 mg/kg  114 kg 250 mL/hr over 120 Minutes Intravenous Every 8 hours 02/17/21 2006 02/17/21 2010   02/17/21 2030  ampicillin (OMNIPEN) 2 g in sodium chloride 0.9 % 100 mL IVPB        2 g 300 mL/hr over 20 Minutes Intravenous Every 4 hours 02/17/21 2006     02/17/21 2030  cefTRIAXone (ROCEPHIN) 2 g in sodium chloride 0.9 % 100 mL IVPB        2 g 200 mL/hr over 30 Minutes Intravenous Every 12 hours 02/17/21 2006 03/03/21 2029   02/17/21 2015  vancomycin (VANCOCIN) 2,850 mg in sodium chloride 0.9 % 500 mL IVPB  Status:  Discontinued       See Hyperspace for full Linked Orders Report.   25 mg/kg  114  kg 250 mL/hr over 120 Minutes Intravenous  Once 02/17/21 2006 02/17/21 2010   02/17/21 2015  vancomycin (VANCOCIN) 2,250 mg in sodium chloride 0.9 % 500 mL IVPB        2,250 mg 250 mL/hr over 120 Minutes Intravenous  Once 02/17/21 2011 02/18/21 0305   02/17/21 1830  acyclovir (ZOVIRAX) 810 mg in dextrose 5 % 150 mL IVPB        10 mg/kg  81.2 kg (Adjusted) 166.2 mL/hr over 60 Minutes Intravenous Every 8 hours 02/17/21 1825 02/18/21 1208       Objective:  Vital Signs  Vitals:   02/18/21 2351 02/19/21 0300 02/19/21 0334 02/19/21 0817  BP: (!) 166/88  (!) 142/64 (!) 144/66  Pulse: 85  81 83  Resp: 20 20 20 12   Temp: 98.6 F (37 C)  98.2 F (36.8 C) 98.6 F (37 C)  TempSrc: Oral   Oral  SpO2: 94%  99% 98%  Weight:      Height:        Intake/Output Summary (Last 24 hours) at 02/19/2021 0844 Last data filed at 02/19/2021 0820 Gross per 24 hour  Intake 300 ml  Output 1800 ml  Net -1500 ml    Filed Weights   02/17/21 1600  Weight: 114 kg    General appearance: Somnolent but arousable.  Remains distracted.  In no distress. Resp: Clear to auscultation bilaterally.  Normal effort Cardio: S1-S2 is normal regular.  No S3-S4.  No rubs murmurs or bruit GI: Abdomen is soft.  Nontender nondistended.  Bowel sounds are present normal.  No masses organomegaly Extremities: No edema.  Full range of motion of lower extremities. Neurologic: Remains disoriented. no focal neurological deficits.     Lab Results:  Data Reviewed: I have personally reviewed following labs and imaging studies  CBC: Recent Labs  Lab 02/17/21 1638 02/17/21 1643 02/17/21 1845 02/18/21 0345 02/19/21 0221  WBC 13.4*  --   --  17.3* 11.2*  NEUTROABS 10.1*  --   --  15.3*  --   HGB 14.9 15.6* 15.3* 13.8 13.4  HCT 46.1* 46.0 45.0 43.4 41.5  MCV 87.6  --   --  87.9 87.0  PLT 185  --   --  191 171     Basic Metabolic Panel: Recent Labs  Lab 02/17/21 1638 02/17/21 1643 02/17/21 1845  02/18/21 0345 02/19/21 0221  NA 139 141 140 136 138  K 3.8 3.6 3.5 3.7 3.3*  CL 105 105  --  103 107  CO2 23  --   --  25 24  GLUCOSE 201* 194*  --  108* 95  BUN 15 16  --  11 8  CREATININE 0.96 0.90  --  0.85 0.86  CALCIUM 8.7*  --   --  8.1* 8.0*  MG  --   --   --  1.9 1.9  PHOS  --   --   --  3.1  --      GFR: Estimated Creatinine Clearance: 80.3 mL/min (by C-G formula based on SCr of 0.86 mg/dL).  Liver Function Tests: Recent Labs  Lab 02/17/21 1638 02/18/21 0345  AST 19 17  ALT 17 18  ALKPHOS 100 95  BILITOT 0.3 0.5  PROT 6.6 6.1*  ALBUMIN 3.3* 3.1*       Coagulation Profile: Recent Labs  Lab 02/17/21 1638  INR 1.1      HbA1C: Recent Labs    02/18/21 0345  HGBA1C 5.8*     CBG: Recent Labs  Lab 02/18/21 1607 02/18/21 1950 02/18/21 2349 02/19/21 0338 02/19/21 0811  GLUCAP 95 95 98 97 96       Thyroid Function Tests: Recent Labs    02/18/21 0345  TSH 0.682  FREET4 0.88      Recent Results (from the past 240 hour(s))  Resp Panel by RT-PCR (Flu A&B, Covid) Nasopharyngeal Swab     Status: None   Collection Time: 02/17/21  7:10 PM   Specimen: Nasopharyngeal Swab; Nasopharyngeal(NP) swabs in vial transport medium  Result Value Ref Range Status   SARS Coronavirus 2 by RT PCR NEGATIVE NEGATIVE Final    Comment: (NOTE) SARS-CoV-2 target nucleic acids are NOT DETECTED.  The SARS-CoV-2 RNA is generally detectable in upper respiratory specimens during the acute phase of infection. The lowest concentration of SARS-CoV-2 viral copies this assay can detect is 138 copies/mL. A negative result does not preclude SARS-Cov-2 infection and should not be used as the sole basis for treatment or other patient management decisions. A negative result may occur with  improper specimen collection/handling, submission of specimen other than nasopharyngeal swab, presence of viral mutation(s) within the areas targeted  by this assay, and inadequate  number of viral copies(<138 copies/mL). A negative result must be combined with clinical observations, patient history, and epidemiological information. The expected result is Negative.  Fact Sheet for Patients:  EntrepreneurPulse.com.au  Fact Sheet for Healthcare Providers:  IncredibleEmployment.be  This test is no t yet approved or cleared by the [place] FDA and  has been authorized for detection and/or diagnosis of SARS-CoV-2 by FDA under an Emergency Use Authorization (EUA). This EUA will remain  in effect (meaning this test can be used) for the duration of the COVID-19 declaration under Section 564(b)(1) of the Act, 21 U.S.C.section 360bbb-3(b)(1), unless the authorization is terminated  or revoked sooner.       Influenza A by PCR NEGATIVE NEGATIVE Final   Influenza B by PCR NEGATIVE NEGATIVE Final    Comment: (NOTE) The Xpert Xpress SARS-CoV-2/FLU/RSV plus assay is intended as an aid in the diagnosis of influenza from Nasopharyngeal swab specimens and should not be used as a sole basis for treatment. Nasal washings and aspirates are unacceptable for Xpert Xpress SARS-CoV-2/FLU/RSV testing.  Fact Sheet for Patients: EntrepreneurPulse.com.au  Fact Sheet for Healthcare Providers: IncredibleEmployment.be  This test is not yet approved or cleared by the [place] FDA and has been authorized for detection and/or diagnosis of SARS-CoV-2 by FDA under an Emergency Use Authorization (EUA). This EUA will remain in effect (meaning this test can be used) for the duration of the COVID-19 declaration under Section 564(b)(1) of the Act, 21 U.S.C. section 360bbb-3(b)(1), unless the authorization is terminated or revoked.  Performed at [facility], [place] [address] [place], [place] [address]        Radiology Studies: MR BRAIN WO CONTRAST  Result Date: 02/17/2021 CLINICAL DATA:  Neuro  deficit, acute, stroke suspected EXAM: MRI HEAD WITHOUT CONTRAST TECHNIQUE: Multiplanar, multiecho pulse sequences of the brain and surrounding structures were obtained without intravenous contrast. COMPARISON:  No prior MRI, correlation made with CT head 02/17/2021. FINDINGS: Brain: Restricted diffusion with ADC correlate, most prominent in the in the bilateral anterior temporal lobes, left greater than right medial temporal lobes, and left temporoparietal region. These areas are associated with increased T2 signal and gyral swelling. No hemorrhage, mass, mass effect, or midline shift. T2 hyperintense signal in the periventricular white matter, likely the sequela of chronic small vessel ischemic disease. Vascular: Normal flow voids. Skull and upper cervical spine: Normal marrow signal. Sinuses/Orbits: Negative. Other: The mastoids are well aerated. IMPRESSION: Restricted diffusion and increased T2 signal, most prominent in the bilateral anterior temporal lobes, left greater than right medial temporal lobes, and left temporoparietal region, which is nonspecific but could be seen in the setting of viral or autoimmune encephalitis. Seizures can also appears similar. Electronically Signed   By: Merilyn Baba M.D.   On: 02/17/2021 18:13   DG Chest Port 1 View  Result Date: 02/17/2021 CLINICAL DATA:  Shortness of breath EXAM: PORTABLE CHEST 1 VIEW COMPARISON:  Portable exam 1823 hours compared to 01/02/2017 FINDINGS: Enlargement of cardiac silhouette with pulmonary vascular congestion. Decreased lung volumes versus previous exam with mild bibasilar atelectasis. Scattered interstitial prominence, question mild pulmonary edema. No pleural effusion or pneumothorax. Bones demineralized. IMPRESSION: Enlargement of cardiac silhouette with pulmonary vascular congestion and interstitial prominence question pulmonary edema. Bibasilar atelectasis. Electronically Signed   By: Lavonia Dana M.D.   On: 02/17/2021 18:35    Overnight EEG with video  Result Date: 02/18/2021 Lora Havens, MD     02/18/2021  9:17  AM Patient Name: Robin Adams MRN: [id number] Epilepsy Attending: Lora Havens Referring Physician/Provider: Dr Lesleigh Noe Duration: 02/17/2021 2047 to 02/18/2021 0915  Patient history: 70 y.o. female who presented to the ED via EMS for evaluation of altered mental status with confusion, difficulty following commands, and nonverbal state after being found by her daughter altered this afternoon. EEG to evaluate for seizure  Level of alertness: Awake, asleep  AEDs during EEG study: None  Technical aspects: This EEG study was done with scalp electrodes positioned according to the 10-20 International system of electrode placement. Electrical activity was acquired at a sampling rate of 500Hz  and reviewed with a high frequency filter of 70Hz  and a low frequency filter of 1Hz . EEG data were recorded continuously and digitally stored.  Description: The posterior dominant rhythm consists of 8 Hz activity of moderate voltage (25-35 uV) seen predominantly in posterior head regions, asymmetric ( left< right) and reactive to eye opening and eye closing. EEG showed continuous 3 to 6 Hz theta-delta slowing in left hemisphere, maximal left frontotemporal region. Lateralized periodic discharges were also noted in left hemisphere, maximal left frontotemporal region with fluctuation frequency between 0.5-1hz . Hyperventilation and photic stimulation were not performed.    ABNORMALITY - Lateralized periodic discharges, left hemisphere, maximal left frontotemporal region - Continuous slow, left hemisphere, maximal left frontotemporal region - background asymmetry, left<right  IMPRESSION: This study showed evidence of epileptogenicity and cortical dysfunction arising from left hemisphere, maximal left frontotemporal region likely secondary to underlying structural abnormality. This eeg pattern is on the ictal -interictal continuum  with high potential for seizures. No definite seizures were seen throughout the recording.  Lora Havens   ECHOCARDIOGRAM COMPLETE  Result Date: 02/18/2021    ECHOCARDIOGRAM REPORT   Patient Name:   Robin Adams Date of Exam: 02/18/2021 Medical Rec #:  [id number]    Height:       66.0 in Accession #:    [id number]   Weight:       251.3 lb Date of Birth:  05-09-1950    BSA:          2.204 m Patient Age:    24 years     BP:           110/66 mmHg Patient Gender: F            HR:           82 bpm. Exam Location:  Inpatient Procedure: 2D Echo, Cardiac Doppler and Color Doppler Indications:    Cardiomegaly  History:        Patient has no prior history of Echocardiogram examinations.                 Risk Factors:Hypertension.  Sonographer:    Jyl Heinz Referring Phys: [place]  1. Left ventricular ejection fraction, by estimation, is 60 to 65%. The left ventricle has normal function. The left ventricle has no regional wall motion abnormalities. There is mild concentric left ventricular hypertrophy. Left ventricular diastolic parameters were normal.  2. Right ventricular systolic function is normal. The right ventricular size is normal. Tricuspid regurgitation signal is inadequate for assessing PA pressure.  3. The mitral valve is normal in structure. No evidence of mitral valve regurgitation. No evidence of mitral stenosis.  4. The aortic valve is normal in structure. Aortic valve regurgitation is not visualized. Mild aortic valve sclerosis is present, with no evidence of aortic valve stenosis.  5. The inferior vena cava is normal in  size with greater than 50% respiratory variability, suggesting right atrial pressure of 3 mmHg. Comparison(s): No prior Echocardiogram. FINDINGS  Left Ventricle: Left ventricular ejection fraction, by estimation, is 60 to 65%. The left ventricle has normal function. The left ventricle has no regional wall motion abnormalities. The left ventricular internal  cavity size was normal in size. There is  mild concentric left ventricular hypertrophy. Left ventricular diastolic parameters were normal. Right Ventricle: The right ventricular size is normal. No increase in right ventricular wall thickness. Right ventricular systolic function is normal. Tricuspid regurgitation signal is inadequate for assessing PA pressure. Left Atrium: Left atrial size was normal in size. Right Atrium: Right atrial size was normal in size. Pericardium: There is no evidence of pericardial effusion. Mitral Valve: The mitral valve is normal in structure. No evidence of mitral valve regurgitation. No evidence of mitral valve stenosis. Tricuspid Valve: The tricuspid valve is normal in structure. Tricuspid valve regurgitation is not demonstrated. No evidence of tricuspid stenosis. Aortic Valve: The aortic valve is normal in structure. Aortic valve regurgitation is not visualized. Mild aortic valve sclerosis is present, with no evidence of aortic valve stenosis. Aortic valve peak gradient measures 12.8 mmHg. Pulmonic Valve: The pulmonic valve was normal in structure. Pulmonic valve regurgitation is trivial. No evidence of pulmonic stenosis. Aorta: The aortic root is normal in size and structure. Venous: The inferior vena cava is normal in size with greater than 50% respiratory variability, suggesting right atrial pressure of 3 mmHg. IAS/Shunts: No atrial level shunt detected by color flow Doppler.  LEFT VENTRICLE PLAX 2D LVIDd:         4.30 cm      Diastology LVIDs:         3.60 cm      LV e' medial:    7.40 cm/s LV PW:         1.30 cm      LV E/e' medial:  8.5 LV IVS:        1.30 cm      LV e' lateral:   8.92 cm/s LVOT diam:     2.00 cm      LV E/e' lateral: 7.1 LV SV:         60 LV SV Index:   27 LVOT Area:     3.14 cm  LV Volumes (MOD) LV vol d, MOD A2C: 87.6 ml LV vol d, MOD A4C: 110.0 ml LV vol s, MOD A2C: 41.6 ml LV vol s, MOD A4C: 45.3 ml LV SV MOD A2C:     46.0 ml LV SV MOD A4C:     110.0 ml LV  SV MOD BP:      56.7 ml RIGHT VENTRICLE             IVC RV Basal diam:  3.40 cm     IVC diam: 2.00 cm RV Mid diam:    2.80 cm RV S prime:     15.10 cm/s TAPSE (M-mode): 2.7 cm LEFT ATRIUM             Index        RIGHT ATRIUM           Index LA diam:        3.50 cm 1.59 cm/m   RA Area:     16.30 cm LA Vol (A2C):   50.9 ml 23.10 ml/m  RA Volume:   40.90 ml  18.56 ml/m LA Vol (A4C):   43.6 ml 19.79 ml/m LA Biplane  Vol: 48.4 ml 21.96 ml/m  AORTIC VALVE AV Area (Vmax): 1.76 cm AV Vmax:        179.00 cm/s AV Peak Grad:   12.8 mmHg LVOT Vmax:      100.00 cm/s LVOT Vmean:     67.600 cm/s LVOT VTI:       0.190 m  AORTA Ao Root diam: 2.80 cm Ao Asc diam:  3.10 cm MITRAL VALVE MV Area (PHT): 4.68 cm    SHUNTS MV Decel Time: 162 msec    Systemic VTI:  0.19 m MV E velocity: 62.90 cm/s  Systemic Diam: 2.00 cm MV A velocity: 64.30 cm/s MV E/A ratio:  0.98 Kardie Tobb DO Electronically signed by Berniece Salines DO Signature Date/Time: 02/18/2021/4:54:17 PM    Final    US THYROID  Result Date: 02/18/2021 CLINICAL DATA:  Thyroid lesion EXAM: THYROID ULTRASOUND TECHNIQUE: Ultrasound examination of the thyroid gland and adjacent soft tissues was performed. COMPARISON:  None. FINDINGS: Parenchymal Echotexture: Mildly heterogeneous Isthmus: 0.5 cm Right lobe: 3.4 x 1.3 x 1.8 cm Left lobe: 4.6 x 1.8 x 1.5 cm _________________________________________________________ Estimated total number of nodules >/= 1 cm: 2 Number of spongiform nodules >/=  2 cm not described below (TR1): 0 Number of mixed cystic and solid nodules >/= 1.5 cm not described below (TR2): 0 _________________________________________________________ Nodule # 1: Location: Isthmus; superior Maximum size: 3.1 cm; Other 2 dimensions: 2.6 x 2.0 cm Composition: mixed cystic and solid (1) Echogenicity: isoechoic (1) Shape: not taller-than-wide (0) Margins: smooth (0) Echogenic foci: none (0) ACR TI-RADS total points: 2. ACR TI-RADS risk category: TR2 (2 points). ACR  TI-RADS recommendations: This nodule does NOT meet TI-RADS criteria for biopsy or dedicated follow-up. _________________________________________________________ 0.9 x 0.6 x 0.8 cm cystic nodule in the right superior thyroid lobe does not meet criteria for imaging surveillance or FNA. IMPRESSION: Isthmus and right thyroid nodules do not meet criteria for FNA or imaging surveillance. No suspicious thyroid nodules. The above is in keeping with the ACR TI-RADS recommendations - J Am Coll Radiol 2017;14:587-595. Electronically Signed   By: Miachel Roux M.D.   On: 02/18/2021 07:37   CT HEAD CODE STROKE WO CONTRAST  Result Date: 02/17/2021 CLINICAL DATA:  Code stroke. Neuro deficit, acute, stroke suspected. Confusion. EXAM: CT HEAD WITHOUT CONTRAST TECHNIQUE: Contiguous axial images were obtained from the base of the skull through the vertex without intravenous contrast. COMPARISON:  12/16/2011 FINDINGS: Brain: No acute finding by CT. Mild chronic small-vessel ischemic changes of the white matter in left basal ganglia. No identifiable acute infarction, mass lesion, hemorrhage, hydrocephalus or extra-axial collection. Vascular: There is atherosclerotic calcification of the major vessels at the base of the brain. Skull: Negative Sinuses/Orbits: Clear/normal Other: None ASPECTS ([place] Stroke Program Early CT Score) - Ganglionic level infarction (caudate, lentiform nuclei, internal capsule, insula, M1-M3 cortex): 7 - Supraganglionic infarction (M4-M6 cortex): 3 Total score (0-10 with 10 being normal): 10 IMPRESSION: 1. No acute finding by CT. Mild chronic small-vessel ischemic change of the cerebral hemispheric white matter and left basal ganglia. 2. These results were communicated to Dr. Curly Shores at 4:49 pm on 02/17/2021 by text page via the [facility] messaging system. 3. ASPECTS is 10 Electronically Signed   By: Nelson Chimes M.D.   On: 02/17/2021 16:50   CT ANGIO HEAD NECK W WO CM W PERF (CODE STROKE)  Result Date:  02/17/2021 CLINICAL DATA:  Aphasia, confusion, stroke suspected EXAM: CT ANGIOGRAPHY HEAD AND NECK CT PERFUSION BRAIN TECHNIQUE: Multidetector CT imaging  of the head and neck was performed using the standard protocol during bolus administration of intravenous contrast. Multiplanar CT image reconstructions and MIPs were obtained to evaluate the vascular anatomy. Carotid stenosis measurements (when applicable) are obtained utilizing NASCET criteria, using the distal internal carotid diameter as the denominator. Multiphase CT imaging of the brain was performed following IV bolus contrast injection. Subsequent parametric perfusion maps were calculated using RAPID software. CONTRAST:  18mL OMNIPAQUE IOHEXOL 350 MG/ML SOLN COMPARISON:  03-09-21 CT head codes stroke, 01/05/2012 CT head. FINDINGS: CT HEAD FINDINGS Please see same day CT head code stroke for noncontrast findings. CTA NECK FINDINGS Aortic arch: Standard branching. Imaged portion shows no evidence of aneurysm or dissection. No significant stenosis of the major arch vessel origins. Right carotid system: No evidence of dissection, stenosis (50% or greater) or occlusion. Left carotid system: No evidence of dissection, stenosis (50% or greater) or occlusion. Vertebral arteries: Codominant. No evidence of dissection, stenosis (50% or greater) or occlusion. Skeleton: No acute osseous abnormality. Other neck: Hypoattenuating lesion in the thyroid isthmus measures up to 1.5 x 1.9 x 2.8 cm. The neck is otherwise negative. Upper chest: Dependent atelectasis. No pleural effusion or focal pulmonary opacity. Review of the MIP images confirms the above findings CTA HEAD FINDINGS Anterior circulation: Both internal carotid arteries are patent to the termini, without stenosis or other abnormality. A1 segments patent. Normal anterior communicating artery. Anterior cerebral arteries are patent to their distal aspects. No M1 stenosis or occlusion. Normal MCA bifurcations.  Distal MCA branches perfused and symmetric. Posterior circulation: Vertebral arteries widely patent to the vertebrobasilar junction without stenosis. Posterior inferior cerebral arteries patent bilaterally. Basilar patent to its distal aspect. Superior cerebral arteries patent bilaterally. Bilateral posterior communicating arteries are visualized. Diminutive right P1, with near fetal origin of the right PCA. Normal left P1. PCAs well perfused to their distal aspects without stenosis. Venous sinuses: As permitted by contrast timing, patent. Anatomic variants: None significant Review of the MIP images confirms the above findings CT Brain Perfusion Findings: ASPECTS: 10 CBF (<30%) Volume: 92mL Perfusion (Tmax>6.0s) volume: 59mL Mismatch Volume: 75mL Infarction Location:None IMPRESSION: 1. No intracranial large vessel occlusion or hemodynamically significant stenosis. 2. No hemodynamically significant stenosis in the neck. 3. CT perfusion demonstrates no infarct core or penumbra. 4. Incidental note is made of hypoattenuating lesion in the thyroid isthmus, measuring up to 2.8 cm. If this has not previously been evaluated, an ultrasound of the thyroid is recommended. Electronically Signed   By: Merilyn Baba M.D.   On: 2021/03/09 17:17       LOS: 2 days   [facility] Pager on [email or web address]  02/19/2021, 8:44 AM

## 2021-02-19 NOTE — Plan of Care (Signed)
On further review of CSF studies, very low concern for bacterial meningitis and therefore antibiotics were discontinued  Results for [name], [name] (MRN [id number]) as of 02/19/2021 21:06  Ref. Range 02/19/2021 15:46  Appearance, CSF Latest Ref Range: CLEAR  CLEAR (A)  Glucose, CSF Latest Ref Range: 40 - 70 mg/dL 52  RBC Count, CSF Latest Ref Range: 0 /cu mm 104 (H)  WBC, CSF Latest Ref Range: 0 - 5 /cu mm 3  Other Cells, CSF Unknown FEW LYMPHS, RARE MONOCYTES, OCCASIONAL NEUTROPHIL  Color, CSF Latest Ref Range: COLORLESS  COLORLESS  Supernatant Unknown NOT INDICATED  Total  Protein, CSF Latest Ref Range: 15 - 45 mg/dL 38  Tube # Unknown [place] MD-PhD [facility] [phone]

## 2021-02-19 NOTE — Evaluation (Signed)
Physical Therapy Evaluation Patient Details Name: Robin Adams MRN: [id number] DOB: 1951/03/13 Today's Date: 02/19/2021  History of Present Illness  70 y.o. female who presented to the ED via EMS for evaluation of altered mental status with confusion, difficulty following commands, and nonverbal state after being found by her daughter altered this afternoon. 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   Clinical Impression  Patient received sleeping in bed, daughter at bedside. Patient rouses to her name. Somewhat groggy. She continues to have confusion. Oriented to person and place only. She required min +2 assist for bed mobility, min/mod assist +2 for sit to stand and to take a few steps along edge of bed. Patient is unsteady and reports dizziness with sitting and standing. She will continue to benefit from skilled PT while here to improve functional independence and safety.          Recommendations for follow up therapy are one component of a multi-disciplinary discharge planning process, led by the attending physician.  Recommendations may be updated based on patient status, additional functional criteria and insurance authorization.  Follow Up Recommendations Acute inpatient rehab (3hours/day)    Assistance Recommended at Discharge Frequent or constant Supervision/Assistance  Functional Status Assessment Patient has had a recent decline in their functional status and demonstrates the ability to make significant improvements in function in a reasonable and predictable amount of time.  Equipment Recommendations  Rolling walker (2 wheels)    Recommendations for Other Services Rehab consult     Precautions / Restrictions Precautions Precautions: Fall Restrictions Weight Bearing Restrictions: No      Mobility  Bed Mobility Overal bed mobility: Needs  Assistance Bed Mobility: Supine to Sit;Sit to Supine     Supine to sit: Min assist Sit to supine: Min assist   General bed mobility comments: requires assist for basic bed mobility due to weakness    Transfers Overall transfer level: Needs assistance Equipment used: Rolling walker (2 wheels) Transfers: Sit to/from Stand Sit to Stand: Min assist;+2 physical assistance;+2 safety/equipment;From elevated surface           General transfer comment: patient endorses dizziness upon sitting edge of bed. Requires min a +2 for safety with sit to stand. Wobbly in standing    Ambulation/Gait Ambulation/Gait assistance: Min assist;+2 physical assistance;+2 safety/equipment Gait Distance (Feet): 3 Feet Assistive device: Rolling walker (2 wheels) Gait Pattern/deviations: Step-to pattern;Decreased step length - right;Decreased step length - left Gait velocity: decr     General Gait Details: patient is unsteady with mobility at this time and requires assistance for balance  Stairs            Wheelchair Mobility    Modified Rankin (Stroke Patients Only)       Balance Overall balance assessment: Needs assistance Sitting-balance support: Feet supported Sitting balance-Leahy Scale: Fair Sitting balance - Comments: dizzy and needs close supervision for sitting.   Standing balance support: Bilateral upper extremity supported;During functional activity Standing balance-Leahy Scale: Poor Standing balance comment: poor standing balance, wobby. Benefits from +2 min assist.                             Pertinent Vitals/Pain Pain Assessment: Faces Faces Pain Scale: Hurts a little bit Breathing: normal Negative Vocalization: none Facial Expression: smiling or inexpressive Body Language: relaxed Consolability: no need to console PAINAD Score: 0 Pain Location: legs with  assisted rolling Pain Descriptors / Indicators: Discomfort Pain Intervention(s): Monitored during  session;Limited activity within patient's tolerance;Repositioned    Home Living Family/patient expects to be discharged to:: Private residence Living Arrangements: Children Available Help at Discharge: Family;Available PRN/intermittently Type of Home: House Home Access: Stairs to enter   [employer] of Steps: 3 or 6 Alternate Level Stairs-Number of Steps: flight Home Layout: Two level;Bed/bath upstairs Home Equipment: None      Prior Function Prior Level of Function : Independent/Modified Independent             Mobility Comments: independent ambulator, drives ADLs Comments: independent     Hand Dominance        Extremity/Trunk Assessment   Upper Extremity Assessment Upper Extremity Assessment: Defer to OT evaluation    Lower Extremity Assessment Lower Extremity Assessment: Generalized weakness RLE Coordination: decreased gross motor       Communication   Communication: Expressive difficulties;Other (comment) (garbled speech at times)  Cognition Arousal/Alertness: Awake/alert Behavior During Therapy: WFL for tasks assessed/performed Overall Cognitive Status: Impaired/Different from baseline Area of Impairment: Orientation;Awareness;Attention;Problem solving;Memory;Following commands;Safety/judgement                 Orientation Level: Disoriented to;Time;Situation Current Attention Level: Sustained Memory: Decreased short-term memory Following Commands: Follows one step commands with increased time;Follows one step commands inconsistently Safety/Judgement: Decreased awareness of safety;Decreased awareness of deficits Awareness: Intellectual Problem Solving: Slow processing;Decreased initiation;Difficulty sequencing;Requires verbal cues;Requires tactile cues          General Comments      Exercises     Assessment/Plan    PT Assessment Patient needs continued PT services  PT Problem List Decreased strength;Decreased  mobility;Decreased safety awareness;Decreased activity tolerance;Decreased balance;Decreased knowledge of use of DME;Decreased cognition;Decreased coordination;Obesity       PT Treatment Interventions DME instruction;Therapeutic activities;Gait training;Therapeutic exercise;Patient/family education;Stair training;Balance training;Functional mobility training    PT Goals (Current goals can be found in the Care Plan section)  Acute Rehab PT Goals Patient Stated Goal: to improve and return home PT Goal Formulation: With family Time For Goal Achievement: 03/05/21 Potential to Achieve Goals: Fair    Frequency Min 3X/week   Barriers to discharge Decreased caregiver support;Inaccessible home environment patient lives on second floor of daughter's home. Daughter works nights.    Co-evaluation PT/OT/SLP Co-Evaluation/Treatment: Yes Reason for Co-Treatment: For patient/therapist safety;Necessary to address cognition/behavior during functional activity;To address functional/ADL transfers PT goals addressed during session: Mobility/safety with mobility;Balance;Proper use of DME         AM-PAC PT "6 Clicks" Mobility  Outcome Measure Help needed turning from your back to your side while in a flat bed without using bedrails?: A Lot Help needed moving from lying on your back to sitting on the side of a flat bed without using bedrails?: A Lot Help needed moving to and from a bed to a chair (including a wheelchair)?: A Lot Help needed standing up from a chair using your arms (e.g., wheelchair or bedside chair)?: A Lot Help needed to walk in hospital room?: Total Help needed climbing 3-5 steps with a railing? : Total 6 Click Score: 10    End of Session Equipment Utilized During Treatment: Gait belt Activity Tolerance: Patient limited by lethargy Patient left: in bed;with call bell/phone within reach;with bed alarm set;with family/visitor present Nurse Communication: Mobility status PT Visit  Diagnosis: Unsteadiness on feet (R26.81);Other abnormalities of gait and mobility (R26.89);Muscle weakness (generalized) (M62.81);Difficulty in walking, not elsewhere classified (R26.2);Dizziness and giddiness (R42)    Time:  6073-7106 PT Time Calculation (min) (ACUTE ONLY): 29 min   Charges:   PT Evaluation $PT Eval Moderate Complexity: 1 Mod          Argelia Formisano, PT, GCS 02/19/21,1:18 PM

## 2021-02-19 NOTE — Progress Notes (Signed)
Both PIV noted infiltrated and leaking. RN attempted to restart PIV unsuccessful.  Pt scheduled for LP and prior med to be admin. IV team consulted.

## 2021-02-19 NOTE — Progress Notes (Signed)
Patient left for LP at this time.

## 2021-02-19 NOTE — Progress Notes (Signed)
Patient returned from procedure area alert to self, VSS. Appeared om no distress. Denied pain. Daughter at bedside. TELE and IVF reapplied. Will continue to monitor.

## 2021-02-19 NOTE — Progress Notes (Signed)
Neurology Progress Note  Brief HPI per consult note 02/17/21: Robin Adams is a 70 y.o. 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 Code stroke was called, but negative for stroke imaging.  On 02/18/21, her exam revealed resistance of exam but was mostly calm.  S: Unable to perform robust ROS due to mental status. Per daughter at bedside, her mental status is improving. She has a cough (? smokers).   O: Current vital signs: BP (!) 142/64 (BP Location: Right Arm)   Pulse 81   Temp 98.2 F (36.8 C)   Resp 20   Ht 5\' 6"  (1.676 m)   Wt 114 kg   SpO2 99%   BMI 40.56 kg/m  Vital signs in last 24 hours: Temp:  [97.9 F (36.6 C)-98.6 F (37 C)] 98.2 F (36.8 C) (11/11 0334) Pulse Rate:  [79-85] 81 (11/11 0334) Resp:  [18-20] 20 (11/11 0334) BP: (131-166)/(64-88) 142/64 (11/11 0334) SpO2:  [92 %-99 %] 99 % (11/11 0334)  GENERAL: Chronically and mildly ill appearing, but not toxic. Easily aroused and stays awake and alert during exam. NAD.  HEENT: Normocephalic and atraumatic. Coughing incessantly.  LUNGS: Normal respiratory effort.  Ext: warm.  NEURO:  Mental Status: Easily aroused to name calling and touch. She follows commands. Alert and oriented to self, daughter. Says year is 2021 and does not know the month, day of week, or date. She says she is in "doctor place".  Speech/Language: speech is without aphasia or dysarthria.  Names pen and watch but unable to tell the purpose of either. Able to name thumb, but not pinky. Fluency and comprehension intact, although mildly bradyphrenic.  Cranial Nerves:  PERRL. Conservation officer, nature and daughter. Eyelids elevate symmetrically. Smile is symmetrical. Phonation is normal. Tongue is midline without fasciculations.  Strength is 5/5 throughout. Sensation in intact to light touch. DTRs UEs 1+ brachioradialis, LEs 0 at patella.   Medications  Current Facility-Administered Medications:    0.9 %  sodium chloride infusion, , Intravenous, Continuous, Doutova, Anastassia, MD, Last Rate: 75 mL/hr at 02/18/21 1954, New Bag at 02/18/21 1954   acetaminophen (TYLENOL) tablet 650 mg, 650 mg, Oral, Q6H PRN **OR** acetaminophen (TYLENOL) suppository 650 mg, 650 mg, Rectal, Q6H PRN, Doutova, Anastassia, MD   acyclovir (ZOVIRAX) 800 mg in dextrose 5 % 150 mL IVPB, 800 mg, Intravenous, Q8H, Donnamae Jude, RPH, Last Rate: 166 mL/hr at 02/19/21 0513, 800 mg at 02/19/21 0513   ampicillin (OMNIPEN) 2 g in sodium chloride 0.9 % 100 mL IVPB, 2 g, Intravenous, Q4H, Doutova, Anastassia, MD, Last Rate: 300 mL/hr at 02/19/21 0341, 2 g at 02/19/21 0341   cefTRIAXone (ROCEPHIN) 2 g in sodium chloride 0.9 % 100 mL IVPB, 2 g, Intravenous, Q12H, Doutova, Anastassia, MD, Last Rate: 200 mL/hr at 02/18/21 2011, 2 g at 02/18/21 2011   insulin aspart (novoLOG) injection 0-9 Units, 0-9 Units, Subcutaneous, Q4H, Doutova, Anastassia, MD, 1 Units at 02/18/21 0037   levETIRAcetam (KEPPRA) IVPB 1500 mg/ 100 mL premix, 1,500 mg, Intravenous, Q12H, Bhagat, Srishti L, MD, Last Rate: 400 mL/hr at 02/18/21 2102, 1,500 mg at 02/18/21 2102   LORazepam (ATIVAN) injection 1 mg, 1 mg, Intravenous, Q6H PRN, Kristopher Oppenheim, DO, 1 mg at 02/19/21 0423   nicotine (NICODERM CQ - dosed in mg/24 hours) patch 14 mg, 14 mg,  Transdermal, Daily, Doutova, Anastassia, MD, 14 mg at 02/18/21 1104   potassium chloride 10 mEq in 100 mL IVPB, 10 mEq, Intravenous, Q1 Hr x 4, [place] Pink, Gokul, MD   sodium chloride flush (NS) 0.9 % injection 3 mL, 3 mL, Intravenous, Once, Doutova, Anastassia, MD   vancomycin (VANCOREADY) IVPB 1250 mg/250 mL, 1,250 mg, Intravenous, Q12H, Donnamae Jude, RPH, Last Rate: 166.7 mL/hr at 02/19/21 0038, 1,250 mg at 02/19/21 0038  Pertinent Labs CBG 97. WBCC 11.2  (17.3). A1c 5.8%. TSH .682 with normal T3/4.   Results for Robin, Adams (MRN [id number]) as of 02/19/2021 17:46  Ref. Range 02/19/2021 15:46  Glucose, CSF Latest Ref Range: 40 - 70 mg/dL 52  Total  Protein, CSF Latest Ref Range: 15 - 45 mg/dL 38   Imaging MRI brain with contrast is pending.   Assessment: 70 y.o. female who presented to the ED via EMS for evaluation of altered mental status with confusion, difficulty following commands, and nonverbal state after being found by her daughter altered on 11/10. Due to lateralized periodic discharges on EEG, she was started on Keppra with no witnessed seizure activity since. She was globally aphasic initially, and her exam today shows improvement in her mental status. Initial results of LP are reassuring against bacterial meningitis.   Impression: -Aphasia and confusion.  -Suspicion for viral vs herpetic meningitis.   Recommendations/Plan:  -Re attempt LP under sedation.  -Follow LP results.  -Can d/c Acyclovir should HSV return normal.  -Consider ID consult for guidance with antibiotics. Doubt this is bacterial.  -Continue Keppra 1500mg  q12 hrs.  -Continue empiric acyclovir for meningitis coverage pending HSV result.  -Continue empiric bacterial meningitis coverage although lower on the differential.  -Follow send out autoimmune encephalitis panel.   Pt seen by Clance Boll, MSN, APN-BC/Nurse Practitioner/Neuro. Attending neurologist pregnant, so for safety reasons, she did not enter room, but plan extensively discussed.  Pager: [phone]

## 2021-02-19 NOTE — Evaluation (Signed)
Occupational Therapy Evaluation Patient Details Name: Robin Adams MRN: [id number] DOB: 1951-02-17 Today's Date: 02/19/2021   History of Present Illness 70 y.o. female who presented to the ED via EMS for evaluation of altered mental status with confusion, difficulty following commands, and nonverbal state after being found by her daughter altered this afternoon. EEG to evaluate for seizure PMH includes: essential hypertension, ovarian cystic masses s/p hysterectomy and bilateral oophorectomy, obesity, and tobacco dependence   Clinical Impression   Pt admitted for concerns listed above. Pta pt reported that she was independent with all ADL's and IADL's, including driving, quilting, cooking, and cleaning. At this time, pt presents with cognitive deficits affecting her executive functioning and safety with completing tasks independently. Additionally, pt's balance is poor, requiring external support to maintain balance and upright posture. Pt requiring min- mod A for all functional mobility and LB ADL's. Recommend Acute Inpatient Rehab to maximize pt's return to independence. OT will follow acutely to address concerns listed below.       Recommendations for follow up therapy are one component of a multi-disciplinary discharge planning process, led by the attending physician.  Recommendations may be updated based on patient status, additional functional criteria and insurance authorization.   Follow Up Recommendations  Acute inpatient rehab (3hours/day)    Assistance Recommended at Discharge Frequent or constant Supervision/Assistance  Functional Status Assessment  Patient has had a recent decline in their functional status and demonstrates the ability to make significant improvements in function in a reasonable and predictable amount of time.  Equipment Recommendations  Other (comment) (TBD)    Recommendations for Other Services Rehab consult     Precautions / Restrictions  Precautions Precautions: Fall Restrictions Weight Bearing Restrictions: No      Mobility Bed Mobility Overal bed mobility: Needs Assistance Bed Mobility: Supine to Sit;Sit to Supine     Supine to sit: Min assist Sit to supine: Min assist   General bed mobility comments: requires assist for basic bed mobility due to weakness    Transfers Overall transfer level: Needs assistance Equipment used: Rolling walker (2 wheels) Transfers: Sit to/from Stand Sit to Stand: Min assist;+2 physical assistance;+2 safety/equipment;From elevated surface           General transfer comment: patient endorses dizziness upon sitting edge of bed. Requires min a +2 for safety with sit to stand. Wobbly in standing      Balance Overall balance assessment: Needs assistance Sitting-balance support: Feet supported Sitting balance-Leahy Scale: Fair Sitting balance - Comments: dizzy and needs close supervision for sitting.   Standing balance support: Bilateral upper extremity supported;During functional activity Standing balance-Leahy Scale: Poor Standing balance comment: poor standing balance, wobby. Benefits from +2 min assist.                           ADL either performed or assessed with clinical judgement   ADL Overall ADL's : Needs assistance/impaired Eating/Feeding: NPO   Grooming: Minimal assistance;Sitting   Upper Body Bathing: Minimal assistance;Sitting   Lower Body Bathing: Moderate assistance;Sitting/lateral leans;Sit to/from stand;+2 for safety/equipment   Upper Body Dressing : Minimal assistance;Sitting   Lower Body Dressing: Moderate assistance;Sitting/lateral leans;Sit to/from stand;+2 for safety/equipment   Toilet Transfer: Moderate assistance;Stand-pivot;+2 for safety/equipment   Toileting- Clothing Manipulation and Hygiene: Moderate assistance;+2 for safety/equipment;Sitting/lateral lean;Sit to/from stand       Functional mobility during ADLs: Moderate  assistance;+2 for safety/equipment;Rolling walker (2 wheels) General ADL Comments: Due to confusion and weakness pt  benefits from +2 assist for safety and mod A for all LB ADL's and transfers     Vision Baseline Vision/History: 1 Wears glasses Ability to See in Adequate Light: 0 Adequate Patient Visual Report: No change from baseline Vision Assessment?: Vision impaired- to be further tested in functional context Additional Comments: Unable to fully assess vision, pt reporting dizziness throughout sessin.     Perception     Praxis      Pertinent Vitals/Pain Pain Assessment: No/denies pain Faces Pain Scale: Hurts a little bit Breathing: normal Negative Vocalization: none Facial Expression: smiling or inexpressive Body Language: relaxed Consolability: no need to console PAINAD Score: 0 Pain Location: legs with assisted rolling Pain Descriptors / Indicators: Discomfort Pain Intervention(s): Monitored during session;Limited activity within patient's tolerance;Repositioned     Hand Dominance Right   Extremity/Trunk Assessment Upper Extremity Assessment Upper Extremity Assessment: Generalized weakness (grossly 4-/5 with all MMT)   Lower Extremity Assessment Lower Extremity Assessment: Defer to PT evaluation RLE Coordination: decreased gross motor   Cervical / Trunk Assessment Cervical / Trunk Assessment: Normal   Communication Communication Communication: Expressive difficulties;Other (comment)   Cognition Arousal/Alertness: Awake/alert Behavior During Therapy: WFL for tasks assessed/performed Overall Cognitive Status: Impaired/Different from baseline Area of Impairment: Orientation;Awareness;Attention;Problem solving;Memory;Following commands;Safety/judgement                 Orientation Level: Disoriented to;Time;Situation Current Attention Level: Sustained Memory: Decreased short-term memory Following Commands: Follows one step commands with increased  time;Follows one step commands inconsistently Safety/Judgement: Decreased awareness of safety;Decreased awareness of deficits Awareness: Intellectual Problem Solving: Slow processing;Decreased initiation;Difficulty sequencing;Requires verbal cues;Requires tactile cues       General Comments  Pt c/o dizziness once sitting up    Exercises     Shoulder Instructions      Home Living Family/patient expects to be discharged to:: Private residence Living Arrangements: Children Available Help at Discharge: Family;Available PRN/intermittently Type of Home: House Home Access: Stairs to enter [employer] of Steps: 3 or 6   Home Layout: Two level;Bed/bath upstairs Alternate Level Stairs-Number of Steps: flight             Home Equipment: None          Prior Functioning/Environment Prior Level of Function : Independent/Modified Independent             Mobility Comments: independent ambulator, drives ADLs Comments: independent        OT Problem List: Decreased strength;Decreased range of motion;Decreased activity tolerance;Impaired balance (sitting and/or standing);Decreased coordination;Decreased cognition;Decreased safety awareness;Decreased knowledge of use of DME or AE      OT Treatment/Interventions: Self-care/ADL training;Therapeutic exercise;Energy conservation;DME and/or AE instruction;Therapeutic activities;Cognitive remediation/compensation;Patient/family education;Balance training    OT Goals(Current goals can be found in the care plan section) Acute Rehab OT Goals Patient Stated Goal: None stated OT Goal Formulation: With patient Time For Goal Achievement: 03/05/21 Potential to Achieve Goals: Good ADL Goals Pt Will Perform Grooming: with supervision;standing Pt Will Perform Lower Body Bathing: with min assist;sitting/lateral leans;sit to/from stand Pt Will Perform Lower Body Dressing: with min assist;sitting/lateral leans;sit to/from stand Pt  Will Transfer to Toilet: with min guard assist;ambulating Pt Will Perform Toileting - Clothing Manipulation and hygiene: with min guard assist;sitting/lateral leans;sit to/from stand Additional ADL Goal #1: Pt will complete a multistep/pathfinding task with minimal assist.  OT Frequency: Min 2X/week   Barriers to D/C:            Co-evaluation PT/OT/SLP Co-Evaluation/Treatment: Yes Reason for Co-Treatment: For patient/therapist safety;Necessary to  address cognition/behavior during functional activity;To address functional/ADL transfers PT goals addressed during session: Mobility/safety with mobility;Balance;Proper use of DME OT goals addressed during session: ADL's and self-care;Strengthening/ROM      AM-PAC OT "6 Clicks" Daily Activity     Outcome Measure Help from another person eating meals?: Total Help from another person taking care of personal grooming?: A Little Help from another person toileting, which includes using toliet, bedpan, or urinal?: A Lot Help from another person bathing (including washing, rinsing, drying)?: A Lot Help from another person to put on and taking off regular upper body clothing?: A Little Help from another person to put on and taking off regular lower body clothing?: A Lot 6 Click Score: 13   End of Session Equipment Utilized During Treatment: Gait belt;Rolling walker (2 wheels) Nurse Communication: Mobility status  Activity Tolerance: Patient tolerated treatment well Patient left: in bed;with call bell/phone within reach;with bed alarm set;with family/visitor present  OT Visit Diagnosis: Unsteadiness on feet (R26.81);Other abnormalities of gait and mobility (R26.89);Muscle weakness (generalized) (M62.81)                Time: 8110-3159 OT Time Calculation (min): 29 min Charges:  OT General Charges $OT Visit: 1 Visit OT Evaluation $OT Eval Moderate Complexity: 1 Mod OT Treatments $Therapeutic Activity: 8-22 mins  Madalene Mickler H., OTR/L Acute  Rehabilitation  Robin Adams Robin Adams 02/19/2021, 2:31 PM

## 2021-02-19 NOTE — Progress Notes (Signed)
Inpatient Rehab Admissions Coordinator:  ? ?Per therapy recommendations,  patient was screened for CIR candidacy by Jarreau Callanan, MS, CCC-SLP. At this time, Pt. Appears to be a a potential candidate for CIR. I will place   order for rehab consult per protocol for full assessment. Please contact me any with questions. ? ?Donavan Kerlin, MS, CCC-SLP ?Rehab Admissions Coordinator  ?[phone] (celll) ?[phone] (office) ? ?

## 2021-02-20 DIAGNOSIS — G0490 Encephalitis and encephalomyelitis, unspecified: Secondary | ICD-10-CM | POA: Diagnosis not present

## 2021-02-20 DIAGNOSIS — G9341 Metabolic encephalopathy: Secondary | ICD-10-CM | POA: Diagnosis not present

## 2021-02-20 DIAGNOSIS — I10 Essential (primary) hypertension: Secondary | ICD-10-CM | POA: Diagnosis not present

## 2021-02-20 DIAGNOSIS — R569 Unspecified convulsions: Secondary | ICD-10-CM | POA: Diagnosis not present

## 2021-02-20 DIAGNOSIS — B004 Herpesviral encephalitis: Secondary | ICD-10-CM | POA: Diagnosis not present

## 2021-02-20 LAB — BASIC METABOLIC PANEL
Anion gap: 10 (ref 5–15)
BUN: 7 mg/dL — ABNORMAL LOW (ref 8–23)
CO2: 22 mmol/L (ref 22–32)
Calcium: 8.5 mg/dL — ABNORMAL LOW (ref 8.9–10.3)
Chloride: 104 mmol/L (ref 98–111)
Creatinine, Ser: 0.77 mg/dL (ref 0.44–1.00)
GFR, Estimated: >60 mL/min (ref >60)
Glucose, Bld: 93 mg/dL (ref 70–99)
Potassium: 3.1 mmol/L — ABNORMAL LOW (ref 3.5–5.1)
Sodium: 136 mmol/L (ref 135–145)

## 2021-02-20 LAB — THYROID PEROXIDASE ANTIBODY
Thyroperoxidase Ab SerPl-aCnc: 11 IU/mL (ref 0–34)
Thyroperoxidase Ab SerPl-aCnc: 18 IU/mL (ref 0–34)

## 2021-02-20 LAB — CBC
HCT: 43.4 % (ref 36.0–46.0)
Hemoglobin: 14 g/dL (ref 12.0–15.0)
MCH: 27.7 pg (ref 26.0–34.0)
MCHC: 32.3 g/dL (ref 30.0–36.0)
MCV: 85.8 fL (ref 80.0–100.0)
Platelets: 183 10*3/uL (ref 150–400)
RBC: 5.06 MIL/uL (ref 3.87–5.11)
RDW: 14.1 % (ref 11.5–15.5)
WBC: 11.6 10*3/uL — ABNORMAL HIGH (ref 4.0–10.5)
nRBC: 0 % (ref 0.0–0.2)

## 2021-02-20 LAB — RSV(RESPIRATORY SYNCYTIAL VIRUS) AB, BLOOD: RSV Ab: 1:16 {titer} — ABNORMAL HIGH

## 2021-02-20 LAB — THYROGLOBULIN ANTIBODY: Thyroglobulin Antibody: <1 IU/mL (ref 0.0–0.9)

## 2021-02-20 MED ORDER — POTASSIUM CHLORIDE 10 MEQ/100ML IV SOLN
10.0000 meq | Freq: Once | INTRAVENOUS | Status: AC
Start: 2021-02-20 — End: 2021-02-20
  Administered 2021-02-20: 10 meq via INTRAVENOUS
  Filled 2021-02-20: qty 100

## 2021-02-20 MED ORDER — LEVETIRACETAM 750 MG PO TABS
1500.0000 mg | ORAL_TABLET | Freq: Two times a day (BID) | ORAL | Status: DC
  Administered 2021-02-20 – 2021-02-24 (×8): 1500 mg via ORAL
  Filled 2021-02-20 (×8): qty 2

## 2021-02-20 MED ORDER — POTASSIUM CHLORIDE 20 MEQ PO PACK
40.0000 meq | PACK | Freq: Once | ORAL | Status: AC
  Administered 2021-02-20: 40 meq via ORAL
  Filled 2021-02-20: qty 2

## 2021-02-20 MED ORDER — POTASSIUM CHLORIDE 10 MEQ/100ML IV SOLN
10.0000 meq | INTRAVENOUS | Status: AC
  Administered 2021-02-20 (×3): 10 meq via INTRAVENOUS
  Filled 2021-02-20 (×3): qty 100

## 2021-02-20 NOTE — Evaluation (Signed)
Clinical/Bedside Swallow Evaluation Patient Details  Name: Robin Adams MRN: [id number] Date of Birth: 09/03/1950  Today's Date: 02/20/2021 Time: SLP Start Time (ACUTE ONLY): 1013 SLP Stop Time (ACUTE ONLY): 1029 SLP Time Calculation (min) (ACUTE ONLY): 16 min  Past Medical History:  Past Medical History:  Diagnosis Date   Hernia, hiatal    Hypertension    Past Surgical History:  Past Surgical History:  Procedure Laterality Date   CESAREAN SECTION     FRACTURE SURGERY Right    IR INJECT/THERA/INC NEEDLE/CATH/PLC EPI/LUMB/SAC W/IMG  02/19/2021   TUBAL LIGATION     HPI:  70 y.o. female who presented to the ED via EMS for evaluation of altered mental status with confusion, difficulty following commands, and nonverbal state after being found by her daughter altered this afternoon. MRI brain 02/19/21 "concerning for autoimmune or viral  encephalitis, including herpes encephalitis. Vasculitis is also in the differential. Seizures or subacute infarcts are felt to be less likely". EEG 11/12. PMH includes: essential hypertension, ovarian cystic masses s/p hysterectomy and bilateral oophorectomy, obesity, and tobacco dependence.    Assessment / Plan / Recommendation  Clinical Impression  Robin Adams seen for bedside swallow evaluation with RN reporting Robin Adams NPO for SLP.  Robin Adams alert and eager to consume POs this am. Oral mechanism examination unremarkable, however it should be noted that Robin Adams wears upper dentures with absent lower dentition. She reports consuming a regular diet at baseline. Trials of ice chips and self fed consecutive sips of thin liquids via straw were without overt s/sx of aspiration. Belching occured x2 with consecutive sips of liquid and suspect esophageal component. All solids consumed without difficulty. Recommend regular, thin liquid diet with adherence to universal swallow/reflux precautions. No further SLP f/u warranted. Above discussed with RN. Please reconsult if changes occur.  SLP  Visit Diagnosis: Dysphagia, unspecified (R13.10)    Aspiration Risk  Mild aspiration risk    Diet Recommendation Regular;Thin liquid   Liquid Administration via: Cup;Straw Medication Administration: Whole meds with liquid Supervision: Patient able to self feed;Intermittent supervision to cue for compensatory strategies Compensations: Minimize environmental distractions;Small sips/bites;Slow rate Postural Changes: Seated upright at 90 degrees;Remain upright for at least 30 minutes after po intake    Other  Recommendations Oral Care Recommendations: Oral care BID    Recommendations for follow up therapy are one component of a multi-disciplinary discharge planning process, led by the attending physician.  Recommendations may be updated based on patient status, additional functional criteria and insurance authorization.  Follow up Recommendations No SLP follow up      Assistance Recommended at Discharge Intermittent Supervision/Assistance  Functional Status Assessment    Frequency and Duration            Prognosis Prognosis for Safe Diet Advancement: Good      Swallow Study   General Date of Onset: 02/20/21 HPI: 70 y.o. female who presented to the ED via EMS for evaluation of altered mental status with confusion, difficulty following commands, and nonverbal state after being found by her daughter altered this afternoon. MRI brain 02/19/21 "concerning for autoimmune or viral  encephalitis, including herpes encephalitis. Vasculitis is also in the differential. Seizures or subacute infarcts are felt to be less likely". EEG 11/12. PMH includes: essential hypertension, ovarian cystic masses s/p hysterectomy and bilateral oophorectomy, obesity, and tobacco dependence. Type of Study: Bedside Swallow Evaluation Previous Swallow Assessment: none per EMR Diet Prior to this Study: NPO Temperature Spikes Noted: No Respiratory Status: Room air History of Recent Intubation:  No  Behavior/Cognition: Alert;Cooperative Oral Cavity Assessment: Within Functional Limits Oral Care Completed by SLP: No Oral Cavity - Dentition: Dentures, top;Dentures, not available;Edentulous Vision: Functional for self-feeding Self-Feeding Abilities: Able to feed self;Needs assist Patient Positioning: Upright in bed;Postural control adequate for testing Baseline Vocal Quality: Normal Volitional Cough: Strong Volitional Swallow: Able to elicit    Oral/Motor/Sensory Function Overall Oral Motor/Sensory Function: Within functional limits   Ice Chips Ice chips: Within functional limits Presentation: Spoon   Thin Liquid Thin Liquid: Within functional limits Presentation: Straw;Self Fed    Nectar Thick Nectar Thick Liquid: Not tested   Honey Thick Honey Thick Liquid: Not tested   Puree Puree: Within functional limits Presentation: Self Fed;Spoon   Solid     Solid: Within functional limits Presentation: [place], [place], [place] Office Number: [phone]  Acie Fredrickson 02/20/2021,10:46 AM

## 2021-02-20 NOTE — Progress Notes (Signed)
Pharmacy Antibiotic Note  Robin Adams is a 70 y.o. female admitted on 02/17/2021 with possible CNS infection.  Initial broad coverage with potential for meningitis including ampicillin 2g q4h and ceftriaxone 2g q12h. Pharmacy has been consulted for Vancomycin and acyclovir dosing.  CSF not indicative of bacterial meningitis. Bacterial agents have been stopped. We will continue with acyclovir with pending HSV lab result.   Plan: Continue Acyclovir 800mg  (10 mg/kg ABW) q8h with NS @75mL /hr for renal protection (decreased due to concern for fluid overload)  Monitor cultures, clinical status, renal fx, Narrow abx as able and f/u duration    Height: 5\' 6"  (167.6 cm) Weight: 114 kg (251 lb 5.2 oz) IBW/kg (Calculated) : 59.3  Temp (24hrs), Avg:98.1 F (36.7 C), Min:98 F (36.7 C), Max:98.4 F (36.9 C)  Recent Labs  Lab 02/17/21 1638 02/17/21 1643 02/18/21 0345 02/19/21 0221 02/20/21 0352  WBC 13.4*  --  17.3* 11.2* 11.6*  CREATININE 0.96 0.90 0.85 0.86 0.77     Estimated Creatinine Clearance: 86.3 mL/min (by C-G formula based on SCr of 0.77 mg/dL).    Allergies  Allergen Reactions   Penicillins    Tetanus Toxoids Hives    Antimicrobials this admission: Acyclovir 11/9  > Ampicillin 11/9  > 11/11 Ceftriaxone 11/9  > 11/11 Vancomycin 11/9  > 11/11  Microbiology results: 11/9 HSV PCR: 11/9 CMV IgM: 11/9 RSV 11/9 EBV: 11/10 CSF culture: NGTD 11/10 thyroid peroxidase Ab: 11/10 thyroglobulin ab:  Thank you for allowing pharmacy to be a part of this patient's care.  Lestine Box, PharmD PGY2 Infectious Diseases Pharmacy Resident   Please check [email or web address] for unit-specific pharmacy phone numbers

## 2021-02-20 NOTE — Progress Notes (Signed)
Inpatient Rehab Admissions Coordinator:   I met with pt.'s daughter in room to discuss potential CIR admit. Pt. Sleeping and did not participate in discussion. Pt.'s daughter is interested in [place] for pt. And reports that family can provide 24/7 support at d/c.She is not sure pt. Will be agreeable to come but will talk to Pt. When she is more alert. I will follow up with her on Monday.   Clemens Catholic, [place], [place] Admissions Coordinator  [phone] ([place]) [phone] (office)

## 2021-02-20 NOTE — Progress Notes (Signed)
Neurology Progress Note  S: Complaints today is RN always telling her not to do things and soreness in Left antecubital space. She says she sometimes has double vision when she looks through her bifocals, but she has not had her eyes checked in 8 years. This is not a new finding. She is thirsty and asking why she is still here.   O: Current vital signs: BP (!) 165/102   Pulse 87   Temp 98.3 F (36.8 C) (Oral)   Resp (!) 23   Ht 5\' 6"  (1.676 m)   Wt 114 kg   SpO2 97%   BMI 40.56 kg/m  Vital signs in last 24 hours: Temp:  [98 F (36.7 C)-98.4 F (36.9 C)] 98.3 F (36.8 C) (11/12 0353) Pulse Rate:  [77-91] 87 (11/12 0353) Resp:  [18-28] 23 (11/12 0353) BP: (155-193)/(77-105) 165/102 (11/12 0353) SpO2:  [95 %-100 %] 97 % (11/12 0353)  GENERAL: Chronically ill  appearing female, but not toxic. Awake, alert in NAD. HEENT: Normocephalic and atraumatic. LUNGS: Normal respiratory effort.  CV: RRR on tele.  Ext: warm. She has a bruise on her left antecubital space from old IV with slight swelling. No other swelling, redness, or heat to LUE.   NEURO:  Mental Status: Alert. Oriented to name, month, place. Not oriented to city, state (she says it is [place]), year (2021), holiday coming up. She is able to tell NP there are 11 quarters in $2.75. She follows all commands.  Speech/Language: speech is without aphasia or dysarthria. Phonation is normal.  Naming-she is able to name pen, glasses, and lenses.   Cranial Nerves:  Conservation officer, nature. Sensation to light touch is symmetrical to face and all 4 extremities. Smile is symmetrical. Hearing intact. Strength is 5/5 throughout, but did not test thigh. She is able to lift both LEs.    Medications  Current Facility-Administered Medications:    0.9 %  sodium chloride infusion, , Intravenous, Continuous, Doutova, Anastassia, MD, Last Rate: 75 mL/hr at 02/20/21 0521, New Bag at 02/20/21 0521   acetaminophen (TYLENOL) tablet 650 mg, 650 mg, Oral,  Q6H PRN **OR** acetaminophen (TYLENOL) suppository 650 mg, 650 mg, Rectal, Q6H PRN, Doutova, Anastassia, MD   acyclovir (ZOVIRAX) 800 mg in dextrose 5 % 150 mL IVPB, 800 mg, Intravenous, Q8H, Donnamae Jude, RPH, Last Rate: 166 mL/hr at 02/20/21 0522, 800 mg at 02/20/21 0522   hydrALAZINE (APRESOLINE) injection 10 mg, 10 mg, Intravenous, Q6H PRN, Bonnielee Haff, MD   levETIRAcetam (KEPPRA) IVPB 1500 mg/ 100 mL premix, 1,500 mg, Intravenous, Q12H, Temitope Griffing L, MD, Last Rate: 400 mL/hr at 02/19/21 2146, 1,500 mg at 02/19/21 2146   LORazepam (ATIVAN) injection 1 mg, 1 mg, Intravenous, Q6H PRN, Kristopher Oppenheim, DO, 1 mg at 02/20/21 0049   nicotine (NICODERM CQ - dosed in mg/24 hours) patch 14 mg, 14 mg, Transdermal, Daily, Doutova, Anastassia, MD, 14 mg at 02/19/21 0806   potassium chloride 10 mEq in 100 mL IVPB, 10 mEq, Intravenous, Q1 Hr x 4, Bonnielee Haff, MD, Last Rate: 100 mL/hr at 02/20/21 0752, 10 mEq at 02/20/21 0752   sodium chloride flush (NS) 0.9 % injection 3 mL, 3 mL, Intravenous, Once, Toy Baker, MD  Pertinent Labs Results for DIAMOND, JENTZ (MRN [id number]) as of 02/20/2021 20:42  Ref. Range 02/19/2021 15:46 02/19/2021 15:46  Appearance, CSF Latest Ref Range: CLEAR  CLEAR (A) CLEAR (A)  Glucose, CSF Latest Ref Range: 40 - 70 mg/dL 52   RBC Count, CSF Latest Ref  Range: 0 /cu mm 104 (H) 5 (H)  WBC, CSF Latest Ref Range: 0 - 5 /cu mm 3 2  Other Cells, CSF Unknown FEW LYMPHS, RARE MONOCYTES, OCCASIONAL NEUTROPHIL FEW LYMPHOCYTES, RARE MONOCYTES, OCCASIONAL NEUTROPHIL  Color, CSF Latest Ref Range: COLORLESS  COLORLESS COLORLESS  Supernatant Unknown NOT INDICATED NOT INDICATED  Total  Protein, CSF Latest Ref Range: 15 - 45 mg/dL 38   Tube # Unknown 1 4   Cryptococcal antigen negative, gram stain with mononuclear cells,  Thyroperoxidase antibodies negative  RSV antibody positive titer 1:16,  EBV VCA IgA IgG positive at greater than 600 with IgM negative, most likely prior  infection  MRI brain  02/19/2021 personally reviewed by attending MD, agree with radiology: IMPRESSION: Evaluation is limited by motion artifact. Within this limitation, there is contrast enhancement in the left anterior temporal lobe and left temporoparietal region, which correlates with areas of restricted diffusion and increased T2 signal seen on the prior MRI. An additional area of possible contrast enhancement in the left pons does not correlate with abnormal signal seen on the prior exam; this may be unrelated or indicate an additional area of abnormality that developed since the prior exam [on neurology review, query whether it may be related to motion artifact]  CT chest/abdomen/pelvis w/ contrast: 02/19/2021 IMPRESSION: 1. No acute intrathoracic abnormality. 2. Hepatic steatosis. 3. Diffuse descending colon and sigmoid colonic diverticulosis with no definite acute diverticulitis. 4. Very shallow umbilical hernia containing a short loop of small bowel. No findings of associated bowel obstruction or findings to suggest ischemia.   This appearance remains concerning for autoimmune or viral encephalitis, including herpes encephalitis. Vasculitis is also in the differential. Seizures or subacute infarcts are felt to be less likely.  Assessment: 70 y.o. female who presented to the ED ago via EMS for evaluation of altered mental status with confusion, difficulty following commands, and nonverbal state after being found by her daughter altered on 11/10. Due to lateralized periodic discharges on EEG, she was started on Keppra with no witnessed seizure activity since. She was globally aphasic initially, and she no longer has this. Her exam today shows greater improvement in her mental status. Results of LP are not consistent with bacterial meningitis/encephalitis, no fever, so her antibiotics were discontinued.   Impression: -Continued confusion, although improved.  -Lateralized  periodic discharges on EEG.   Recommendations/Plan:  -Continue Acyclovir until HSV results.  -Continue Keppra 1500mg  po q12 hours.  -Follow send out autoimmune encephalitis panel.  -If medicine has no objections, patient can get up to recliner with assistance.  -Warm compresses to left antecubital old IV site.   Pt seen by Clance Boll, MSN, APN-BC/Nurse Practitioner. Plan discussed and approved by Dr. Curly Shores.   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  She endorses feeling off for a few days prior to presentation but has difficulty telling me details. Her general examination remains reassuring. On my neurologic examination able to tell me the hospital by looking at the board and reading the [facility] health logo.  Similarly able to state the date by looking at the white board.  Otherwise she has very poor short-term memory and cannot recall the information I have given her about her hospitalization and clinical course within a few minutes.  Cranial nerves remain intact, she notes that her double vision is actually improved.  Motor examination is grossly nonfocal and she is reactive to touch in all  4 extremities.  Finger-nose is intact bilaterally.  Regarding work-up to date, it is interesting that her RSV came back positive, in children this is sometimes associated with encephalitis but has not really been described in adults on my search of the literature.  Remainder of her work-up has been bland as documented above  I personally saw this patient, gathering history, performing a full neurologic examination, reviewing relevant labs, personally reviewing relevant imaging including MRI brain with contrast and, and formulated the assessment and plan, adding the note above for completeness and clarity to accurately reflect my thoughts  Lesleigh Noe MD-PhD [facility] [phone]  Available 7  AM to 7 PM, outside these hours please contact Neurologist on call listed on [place] than 35 minutes were spent care of this patient today greater than 50% at bedside

## 2021-02-20 NOTE — Progress Notes (Signed)
[facility] PROGRESS NOTE   Shaquana Buel KKX:[id number] DOB: 10/05/1950 DOA: 02/17/2021  PCP: Mar Daring, PA-C  Brief History/Interval Summary: 70 y.o. female with medical history significant of HTN, ovarian cystic masses s/p hysterectomy and bilateral oophorectomy, obesity, and tobacco dependence who had not been feeling well for several days prior to admission.  Patient appeared to be very confused to family members.  Subsequently she was brought into the emergency department for further evaluation.  Initial assessment raise concern for meningitis as well as seizure activity.  She was hospitalized for further management.  Reason for Visit: Concern for herpes encephalitis versus meningitis/seizure activity  Consultants: Neurology  Procedures:  Lumbar puncture 11/11  EEG ABNORMALITY - Lateralized periodic discharges, left hemisphere, maximal left frontotemporal region - Continuous slow, left hemisphere, maximal left frontotemporal region - background asymmetry, left<right   IMPRESSION: This study showed evidence of epileptogenicity and cortical dysfunction arising from left hemisphere, maximal left frontotemporal region likely secondary to underlying structural abnormality. This eeg pattern is on the ictal -interictal continuum with high potential for seizures. No definite seizures were seen throughout the recording.    Subjective/Interval History: Patient remains distracted though much more awake and alert compared to yesterday.  Denies any pain issues.  No overnight issues per nursing staff.    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  MRI was done which showed abnormal findings in bilateral temporal lobes.  Seizure activity was also noted on EEG.   LP could not be done at bedside and could not be done under fluoroscopy due to agitation.  Subsequently was done on 11/11 under moderate  sedation by interventional radiology.  Cell count not suggest bacterial meningitis.  Antibacterials have been discontinued.  Remains on acyclovir.  Waiting on herpes PCR and cultures. Due to concern for seizure she remains on Keppra.   CT scan of the chest abdomen and pelvis does not show any obvious malignancy.    History of essential hypertension Was on amlodipine prior to admission.  Held here due to high risk of aspiration.  Continue hydralazine as needed.  Blood pressure noted to be poorly controlled.  Waiting on speech therapy evaluation.  Hopefully her antihypertensive can be resumed later today.  Hyperglycemia at time of admission HbA1c 5.8.  Glucose levels are stable.  No need to check glucose levels on a daily basis.  Hypokalemia Potassium remains low.  Likely due to lack of oral intake.  Replace intravenously for now.  We will see how she does with swallow evaluation today.  May need to give oral supplementation later today.  If she does not do well with swallow evaluation then may need to add some potassium to her maintenance IV fluids.  Magnesium 1.9 yesterday.  Thyroid nodule TSH and free T4 levels are normal.  Thyroid ultrasound was done and no concerning features identified in the thyroid nodules.  No indication for biopsy based on the thyroid ultrasound report.  Outpatient monitoring.  Tobacco abuse Will need counseling when she is more oriented.  Prolonged QT interval Avoid QT prolonging medications.  Monitor on telemetry.  EKG was repeated and does not show QT prolongation.    Cardiomegaly Noted on chest x-ray.  Echocardiogram shows normal systolic function.  No significant valvular abnormalities noted.  Obesity Estimated body mass index is 40.56 kg/m as calculated from the following:   Height as of this encounter: 5\' 6"  (1.676 m).   Weight as of this encounter: 114 kg.  DVT Prophylaxis: SCDs Code Status: Full code Family Communication: No family at bedside  today.  Discussed with daughter yesterday. Disposition Plan: Acute inpatient rehab recommended by physical therapy  Status is: Inpatient  Remains inpatient appropriate because: Altered mental status, need for IV antibiotics,    Medications: Scheduled:  nicotine  14 mg Transdermal Daily   sodium chloride flush  3 mL Intravenous Once   Continuous:  sodium chloride 75 mL/hr at 02/20/21 0521   acyclovir (ZOVIRAX) 724 112 8397 mg IVPB 800 mg (02/20/21 0522)   levETIRAcetam 1,500 mg (02/19/21 2146)   potassium chloride 10 mEq (02/20/21 0752)   [id number] **OR** acetaminophen, hydrALAZINE, LORazepam  Antibiotics: Anti-infectives (From admission, onward)    Start     Dose/Rate Route Frequency Ordered Stop   02/18/21 2200  acyclovir (ZOVIRAX) 800 mg in dextrose 5 % 150 mL IVPB        800 mg 166 mL/hr over 60 Minutes Intravenous Every 8 hours 02/18/21 0911     02/18/21 1300  vancomycin (VANCOREADY) IVPB 1250 mg/250 mL  Status:  Discontinued        1,250 mg 166.7 mL/hr over 90 Minutes Intravenous Every 12 hours 02/18/21 0926 02/19/21 1902   02/18/21 0415  vancomycin (VANCOCIN) 1,710 mg in sodium chloride 0.9 % 500 mL IVPB  Status:  Discontinued       Note to Pharmacy: Maximum dose of 3000mg .  See Hyperspace for full Linked Orders Report.   15 mg/kg  114 kg 250 mL/hr over 120 Minutes Intravenous Every 8 hours 02/17/21 2006 02/17/21 2010   02/17/21 2030  ampicillin (OMNIPEN) 2 g in sodium chloride 0.9 % 100 mL IVPB  Status:  Discontinued        2 g 300 mL/hr over 20 Minutes Intravenous Every 4 hours 02/17/21 2006 02/19/21 1902   02/17/21 2030  cefTRIAXone (ROCEPHIN) 2 g in sodium chloride 0.9 % 100 mL IVPB  Status:  Discontinued        2 g 200 mL/hr over 30 Minutes Intravenous Every 12 hours 02/17/21 2006 02/19/21 1902   02/17/21 2015  vancomycin (VANCOCIN) 2,850 mg in sodium chloride 0.9 % 500 mL IVPB  Status:  Discontinued       See Hyperspace for full Linked Orders Report.   25  mg/kg  114 kg 250 mL/hr over 120 Minutes Intravenous  Once 02/17/21 2006 02/17/21 2010   02/17/21 2015  vancomycin (VANCOCIN) 2,250 mg in sodium chloride 0.9 % 500 mL IVPB        2,250 mg 250 mL/hr over 120 Minutes Intravenous  Once 02/17/21 2011 02/18/21 0305   02/17/21 1830  acyclovir (ZOVIRAX) 810 mg in dextrose 5 % 150 mL IVPB        10 mg/kg  81.2 kg (Adjusted) 166.2 mL/hr over 60 Minutes Intravenous Every 8 hours 02/17/21 1825 02/18/21 1208       Objective:  Vital Signs  Vitals:   02/19/21 1955 02/19/21 2300 02/20/21 0353 02/20/21 0844  BP:  (!) 156/97 (!) 165/102 (!) 173/102  Pulse: 89 91 87 79  Resp: (!) 27 18 (!) 23 (!) 22  Temp:  98 F (36.7 C) 98.3 F (36.8 C) 98 F (36.7 C)  TempSrc:  Axillary Oral Oral  SpO2: 97% 95% 97% 98%  Weight:      Height:        Intake/Output Summary (Last 24 hours) at 02/20/2021 0903 Last data filed at 02/20/2021 0847 Gross per 24 hour  Intake 2596.84 ml  Output 2100  ml  Net 496.84 ml    Filed Weights   02/17/21 1600  Weight: 114 kg    General appearance: Awake alert.  In no distress.  Remains distracted Resp: Clear to auscultation bilaterally.  Normal effort Cardio: S1-S2 is normal regular.  No S3-S4.  No rubs murmurs or bruit GI: Abdomen is soft.  Nontender nondistended.  Bowel sounds are present normal.  No masses organomegaly Extremities: No edema.  Moving all of her extremities Neurologic:  remains disoriented no focal neurological deficits.     Lab Results:  Data Reviewed: I have personally reviewed following labs and imaging studies  CBC: Recent Labs  Lab 02/17/21 1638 02/17/21 1643 02/17/21 1845 02/18/21 0345 02/19/21 0221 02/20/21 0352  WBC 13.4*  --   --  17.3* 11.2* 11.6*  NEUTROABS 10.1*  --   --  15.3*  --   --   HGB 14.9 15.6* 15.3* 13.8 13.4 14.0  HCT 46.1* 46.0 45.0 43.4 41.5 43.4  MCV 87.6  --   --  87.9 87.0 85.8  PLT 185  --   --  191 171 183     Basic Metabolic Panel: Recent Labs   Lab 02/17/21 1638 02/17/21 1643 02/17/21 1845 02/18/21 0345 02/19/21 0221 02/20/21 0352  NA 139 141 140 136 138 136  K 3.8 3.6 3.5 3.7 3.3* 3.1*  CL 105 105  --  103 107 104  CO2 23  --   --  25 24 22   GLUCOSE 201* 194*  --  108* 95 93  BUN 15 16  --  11 8 7*  CREATININE 0.96 0.90  --  0.85 0.86 0.77  CALCIUM 8.7*  --   --  8.1* 8.0* 8.5*  MG  --   --   --  1.9 1.9  --   PHOS  --   --   --  3.1  --   --      GFR: Estimated Creatinine Clearance: 86.3 mL/min (by C-G formula based on SCr of 0.77 mg/dL).  Liver Function Tests: Recent Labs  Lab 02/17/21 1638 02/18/21 0345  AST 19 17  ALT 17 18  ALKPHOS 100 95  BILITOT 0.3 0.5  PROT 6.6 6.1*  ALBUMIN 3.3* 3.1*       Coagulation Profile: Recent Labs  Lab 02/17/21 1638  INR 1.1      HbA1C: Recent Labs    02/18/21 0345  HGBA1C 5.8*     CBG: Recent Labs  Lab 02/18/21 1607 02/18/21 1950 02/18/21 2349 02/19/21 0338 02/19/21 0811  GLUCAP 95 95 98 97 96       Thyroid Function Tests: Recent Labs    02/18/21 0345  TSH 0.682  FREET4 0.88      Recent Results (from the past 240 hour(s))  Resp Panel by RT-PCR (Flu A&B, Covid) Nasopharyngeal Swab     Status: None   Collection Time: 02/17/21  7:10 PM   Specimen: Nasopharyngeal Swab; Nasopharyngeal(NP) swabs in vial transport medium  Result Value Ref Range Status   SARS Coronavirus 2 by RT PCR NEGATIVE NEGATIVE Final    Comment: (NOTE) SARS-CoV-2 target nucleic acids are NOT DETECTED.  The SARS-CoV-2 RNA is generally detectable in upper respiratory specimens during the acute phase of infection. The lowest concentration of SARS-CoV-2 viral copies this assay can detect is 138 copies/mL. A negative result does not preclude SARS-Cov-2 infection and should not be used as the sole basis for treatment or other patient management decisions. A negative result  may occur with  improper specimen collection/handling, submission of specimen other than  nasopharyngeal swab, presence of viral mutation(s) within the areas targeted by this assay, and inadequate number of viral copies(<138 copies/mL). A negative result must be combined with clinical observations, patient history, and epidemiological information. The expected result is Negative.  Fact Sheet for Patients:  EntrepreneurPulse.com.au  Fact Sheet for Healthcare Providers:  IncredibleEmployment.be  This test is no t yet approved or cleared by the [place] FDA and  has been authorized for detection and/or diagnosis of SARS-CoV-2 by FDA under an Emergency Use Authorization (EUA). This EUA will remain  in effect (meaning this test can be used) for the duration of the COVID-19 declaration under Section 564(b)(1) of the Act, 21 U.S.C.section 360bbb-3(b)(1), unless the authorization is terminated  or revoked sooner.       Influenza A by PCR NEGATIVE NEGATIVE Final   Influenza B by PCR NEGATIVE NEGATIVE Final    Comment: (NOTE) The Xpert Xpress SARS-CoV-2/FLU/RSV plus assay is intended as an aid in the diagnosis of influenza from Nasopharyngeal swab specimens and should not be used as a sole basis for treatment. Nasal washings and aspirates are unacceptable for Xpert Xpress SARS-CoV-2/FLU/RSV testing.  Fact Sheet for Patients: EntrepreneurPulse.com.au  Fact Sheet for Healthcare Providers: IncredibleEmployment.be  This test is not yet approved or cleared by the [place] FDA and has been authorized for detection and/or diagnosis of SARS-CoV-2 by FDA under an Emergency Use Authorization (EUA). This EUA will remain in effect (meaning this test can be used) for the duration of the COVID-19 declaration under Section 564(b)(1) of the Act, 21 U.S.C. section 360bbb-3(b)(1), unless the authorization is terminated or revoked.  Performed at [facility], [place] [address], [place], [place] [address]    CSF culture w Gram Stain     Status: None (Preliminary result)   Collection Time: 02/19/21  3:46 PM   Specimen: PATH Cytology CSF; Cerebrospinal Fluid  Result Value Ref Range Status   Specimen Description CSF  Final   Special Requests NONE  Final   Gram Stain   Final    WBC PRESENT, PREDOMINANTLY MONONUCLEAR NO ORGANISMS SEEN CYTOSPIN SMEAR Performed at [facility], [place] [address] [place], [place] [address]    Culture PENDING  Incomplete   Report Status PENDING  Incomplete       Radiology Studies: MR BRAIN W CONTRAST  Result Date: 02/19/2021 CLINICAL DATA:  Seizure, abnormal neuro exam EXAM: MRI HEAD WITH CONTRAST TECHNIQUE: Multiplanar, multiecho pulse sequences of the brain and surrounding structures were obtained with intravenous contrast. CONTRAST:  50mL GADAVIST GADOBUTROL 1 MMOL/ML IV SOLN COMPARISON:  02/17/2021. FINDINGS: Brain: In the left anterior temporal lobe, there is an area of enhancement that measures approximately 2.6 x 1.3 x 1.3 cm (series 8, image 19 and series 17, image 4), which correlates with an area of increased signal on diffusion-weighted imaging and increased T2 signal on the prior exam. Another area of contrast enhancement is noted in the left posterior temporal/occipital region (series 8, image 25 and series 7, image 7), measuring up to 1.8 x 0.6 x 0.6 cm. This also correlates with an area of increased T2 signal and increased signal on diffusion-weighted imaging on the prior exam. Possible 0.7 cm area of contrast enhancement in the left pons (series 6, image 13), although this area is poorly seen on the coronal for corroboration. This does not correlate with abnormal signal on the prior exam. Vascular: Unable to evaluate  Skull and upper cervical spine: Unable to evaluate. Sinuses/Orbits: Limited evaluation, appears normal. Other: None. IMPRESSION: Evaluation is limited by motion artifact. Within this limitation, there is contrast enhancement in the left  anterior temporal lobe and left temporoparietal region, which correlates with areas of restricted diffusion and increased T2 signal seen on the prior MRI. An additional area of possible contrast enhancement in the left pons does not correlate with abnormal signal seen on the prior exam; this may be unrelated or indicate an additional area of abnormality that developed since the prior exam. This appearance remains concerning for autoimmune or viral encephalitis, including herpes encephalitis. Vasculitis is also in the differential. Seizures or subacute infarcts are felt to be less likely. Electronically Signed   By: Merilyn Baba M.D.   On: 02/19/2021 17:07   CT CHEST ABDOMEN PELVIS W CONTRAST  Result Date: 02/19/2021 CLINICAL DATA:  Encephalopathy, AMS. Concern for malignancy EXAM: CT CHEST, ABDOMEN, AND PELVIS WITH CONTRAST TECHNIQUE: Multidetector CT imaging of the chest, abdomen and pelvis was performed following the standard protocol during bolus administration of intravenous contrast. CONTRAST:  163mL OMNIPAQUE IOHEXOL 300 MG/ML  SOLN COMPARISON:  Ultrasound thyroid 02/17/2021 FINDINGS: CT CHEST FINDINGS Cardiovascular: Normal heart size. No significant pericardial effusion. The thoracic aorta is normal in caliber. Mild atherosclerotic plaque of the thoracic aorta. At least 1 vessel coronary artery calcifications. The main pulmonary artery is normal in caliber. No central pulmonary embolus. Mediastinum/Nodes:No enlarged mediastinal, hilar, or axillary lymph nodes. Trachea and esophagus demonstrate no significant findings. Tiny hiatal hernia. There is a 1.6 cm thyroid/isthmus hypodensity. This has been evaluated on previous imaging (ref: J Am Coll Radiol. 2015 Feb;12(2): 143-50). Lungs/Pleura: Thin wall cystic lesion likely benign. No focal consolidation. No pulmonary nodule. No pulmonary mass. No pleural effusion. No pneumothorax. Musculoskeletal: No chest wall abnormality. No suspicious lytic or blastic  osseous lesions. No acute displaced fracture. Multilevel degenerative changes of the spine. CT ABDOMEN PELVIS FINDINGS Hepatobiliary: The hepatic parenchyma is diffusely hypodense compared to the splenic parenchyma consistent with fatty infiltration. Subcentimeter hypodensity too small to characterize. Otherwise no focal liver abnormality. No gallstones, gallbladder wall thickening, or pericholecystic fluid. No biliary dilatation. Pancreas: Diffusely atrophic. No focal lesion. Otherwise normal pancreatic contour. No surrounding inflammatory changes. No main pancreatic ductal dilatation. Spleen: Normal in size without focal abnormality. Adrenals/Urinary Tract: No adrenal nodule bilaterally. Bilateral kidneys enhance symmetrically. No hydronephrosis. No hydroureter. The urinary bladder is unremarkable. On delayed imaging, there is no urothelial wall thickening and there are no filling defects in the opacified portions of the bilateral collecting systems or ureters. Stomach/Bowel: Stomach is within normal limits. No evidence of bowel wall thickening or dilatation. No pneumatosis. Diffuse descending colon and sigmoid colonic diverticulosis. The appendix not definitely identified. Vascular/Lymphatic: No abdominal aorta or iliac aneurysm. Mild atherosclerotic plaque of the aorta and its branches. No abdominal, pelvic, or inguinal lymphadenopathy. Reproductive: Status post hysterectomy. No adnexal masses. Other: No intraperitoneal free fluid. No intraperitoneal free gas. No organized fluid collection. Musculoskeletal: Very shallow umbilical hernia containing a short loop of small bowel. No suspicious lytic or blastic osseous lesions. No acute displaced fracture. Multilevel degenerative changes of the spine. IMPRESSION: 1. No acute intrathoracic abnormality. 2. Hepatic steatosis. 3. Diffuse descending colon and sigmoid colonic diverticulosis with no definite acute diverticulitis. 4. Very shallow umbilical hernia containing  a short loop of small bowel. No findings of associated bowel obstruction or findings to suggest ischemia. Electronically Signed   By: Iven Finn M.D.   On:  02/19/2021 16:21   Overnight EEG with video  Result Date: 02/18/2021 Lora Havens, MD     02/19/2021  9:09 AM Patient Name: Tiahna Cure MRN: [id number] Epilepsy Attending: Lora Havens Referring Physician/Provider: Dr Lesleigh Noe Duration: 02/17/2021 2047 to 02/18/2021 1349  Patient history: 70 y.o. female who presented to the ED via EMS for evaluation of altered mental status with confusion, difficulty following commands, and nonverbal state after being found by her daughter altered this afternoon. EEG to evaluate for seizure  Level of alertness: Awake, asleep  AEDs during EEG study: None  Technical aspects: This EEG study was done with scalp electrodes positioned according to the 10-20 International system of electrode placement. Electrical activity was acquired at a sampling rate of 500Hz  and reviewed with a high frequency filter of 70Hz  and a low frequency filter of 1Hz . EEG data were recorded continuously and digitally stored.  Description: The posterior dominant rhythm consists of 8 Hz activity of moderate voltage (25-35 uV) seen predominantly in posterior head regions, asymmetric ( left< right) and reactive to eye opening and eye closing. EEG showed continuous 3 to 6 Hz theta-delta slowing in left hemisphere, maximal left frontotemporal region. Lateralized periodic discharges were also noted in left hemisphere, maximal left frontotemporal region with fluctuation frequency between 0.5-1hz . Hyperventilation and photic stimulation were not performed.    ABNORMALITY - Lateralized periodic discharges, left hemisphere, maximal left frontotemporal region - Continuous slow, left hemisphere, maximal left frontotemporal region - background asymmetry, left<right  IMPRESSION: This study showed evidence of epileptogenicity and cortical dysfunction  arising from left hemisphere, maximal left frontotemporal region likely secondary to underlying structural abnormality. This eeg pattern is on the ictal -interictal continuum with high potential for seizures. No definite seizures were seen throughout the recording.  Priyanka Barbra Sarks   IR INJECT DIAG/THERA/INC NEEDLE/CATH/PLC EPI/LUMB/SAC W/IMG  Result Date: 02/19/2021 CLINICAL DATA:  70 year old female with concern for meningitis. EXAM: DIAGNOSTIC LUMBAR PUNCTURE UNDER FLUOROSCOPIC GUIDANCE COMPARISON:  None available. FLUOROSCOPY TIME:  Fluoroscopy Time:  1 minute, 6 seconds Radiation Exposure Index (if provided by the fluoroscopic device): 17 mGy Number of Acquired Spot Images: 1 SEDATION: Moderate (conscious) sedation was employed during this procedure. A total of Versed 2 mg and Fentanyl 75 mcg was administered intravenously. Moderate Sedation Time: 21 minutes. The patient's level of consciousness and vital signs were monitored continuously by radiology nursing throughout the procedure under my direct supervision. PROCEDURE: Informed consent was obtained from the patient's daughter prior to the procedure, including potential complications of headache, allergy, and pain. With the patient prone, the lower back was prepped with Betadine. 1% Lidocaine was used for local anesthesia. Lumbar puncture was performed at the L2-L3 level using a 20 gauge needle with return of clear CSF with an opening pressure of 22 cm water. Total of 28 ml of CSF were obtained for laboratory studies. The patient tolerated the procedure well and there were no apparent complications. IMPRESSION: Technically successful left interlaminar L2-L3 lumbar puncture. Ruthann Cancer, MD [facility] Electronically Signed   By: Ruthann Cancer M.D.   On: 02/19/2021 16:03   ECHOCARDIOGRAM COMPLETE  Result Date: 02/18/2021    ECHOCARDIOGRAM REPORT   Patient Name:   MONESHA MONREAL Date of Exam:  02/18/2021 Medical Rec #:  [id number]    Height:       66.0 in Accession #:    [id number]   Weight:       251.3 lb Date of Birth:  Dec 29, 1950  BSA:          2.204 m Patient Age:    48 years     BP:           110/66 mmHg Patient Gender: F            HR:           82 bpm. Exam Location:  Inpatient Procedure: 2D Echo, Cardiac Doppler and Color Doppler Indications:    Cardiomegaly  History:        Patient has no prior history of Echocardiogram examinations.                 Risk Factors:Hypertension.  Sonographer:    Jyl Heinz Referring Phys: [place]  1. Left ventricular ejection fraction, by estimation, is 60 to 65%. The left ventricle has normal function. The left ventricle has no regional wall motion abnormalities. There is mild concentric left ventricular hypertrophy. Left ventricular diastolic parameters were normal.  2. Right ventricular systolic function is normal. The right ventricular size is normal. Tricuspid regurgitation signal is inadequate for assessing PA pressure.  3. The mitral valve is normal in structure. No evidence of mitral valve regurgitation. No evidence of mitral stenosis.  4. The aortic valve is normal in structure. Aortic valve regurgitation is not visualized. Mild aortic valve sclerosis is present, with no evidence of aortic valve stenosis.  5. The inferior vena cava is normal in size with greater than 50% respiratory variability, suggesting right atrial pressure of 3 mmHg. Comparison(s): No prior Echocardiogram. FINDINGS  Left Ventricle: Left ventricular ejection fraction, by estimation, is 60 to 65%. The left ventricle has normal function. The left ventricle has no regional wall motion abnormalities. The left ventricular internal cavity size was normal in size. There is  mild concentric left ventricular hypertrophy. Left ventricular diastolic parameters were normal. Right Ventricle: The right ventricular size is normal. No increase in right ventricular wall  thickness. Right ventricular systolic function is normal. Tricuspid regurgitation signal is inadequate for assessing PA pressure. Left Atrium: Left atrial size was normal in size. Right Atrium: Right atrial size was normal in size. Pericardium: There is no evidence of pericardial effusion. Mitral Valve: The mitral valve is normal in structure. No evidence of mitral valve regurgitation. No evidence of mitral valve stenosis. Tricuspid Valve: The tricuspid valve is normal in structure. Tricuspid valve regurgitation is not demonstrated. No evidence of tricuspid stenosis. Aortic Valve: The aortic valve is normal in structure. Aortic valve regurgitation is not visualized. Mild aortic valve sclerosis is present, with no evidence of aortic valve stenosis. Aortic valve peak gradient measures 12.8 mmHg. Pulmonic Valve: The pulmonic valve was normal in structure. Pulmonic valve regurgitation is trivial. No evidence of pulmonic stenosis. Aorta: The aortic root is normal in size and structure. Venous: The inferior vena cava is normal in size with greater than 50% respiratory variability, suggesting right atrial pressure of 3 mmHg. IAS/Shunts: No atrial level shunt detected by color flow Doppler.  LEFT VENTRICLE PLAX 2D LVIDd:         4.30 cm      Diastology LVIDs:         3.60 cm      LV e' medial:    7.40 cm/s LV PW:         1.30 cm      LV E/e' medial:  8.5 LV IVS:        1.30 cm      LV e' lateral:  8.92 cm/s LVOT diam:     2.00 cm      LV E/e' lateral: 7.1 LV SV:         60 LV SV Index:   27 LVOT Area:     3.14 cm  LV Volumes (MOD) LV vol d, MOD A2C: 87.6 ml LV vol d, MOD A4C: 110.0 ml LV vol s, MOD A2C: 41.6 ml LV vol s, MOD A4C: 45.3 ml LV SV MOD A2C:     46.0 ml LV SV MOD A4C:     110.0 ml LV SV MOD BP:      56.7 ml RIGHT VENTRICLE             IVC RV Basal diam:  3.40 cm     IVC diam: 2.00 cm RV Mid diam:    2.80 cm RV S prime:     15.10 cm/s TAPSE (M-mode): 2.7 cm LEFT ATRIUM             Index        RIGHT ATRIUM            Index LA diam:        3.50 cm 1.59 cm/m   RA Area:     16.30 cm LA Vol (A2C):   50.9 ml 23.10 ml/m  RA Volume:   40.90 ml  18.56 ml/m LA Vol (A4C):   43.6 ml 19.79 ml/m LA Biplane Vol: 48.4 ml 21.96 ml/m  AORTIC VALVE AV Area (Vmax): 1.76 cm AV Vmax:        179.00 cm/s AV Peak Grad:   12.8 mmHg LVOT Vmax:      100.00 cm/s LVOT Vmean:     67.600 cm/s LVOT VTI:       0.190 m  AORTA Ao Root diam: 2.80 cm Ao Asc diam:  3.10 cm MITRAL VALVE MV Area (PHT): 4.68 cm    SHUNTS MV Decel Time: 162 msec    Systemic VTI:  0.19 m MV E velocity: 62.90 cm/s  Systemic Diam: 2.00 cm MV A velocity: 64.30 cm/s MV E/A ratio:  0.98 Kardie Tobb DO Electronically signed by Berniece Salines DO Signature Date/Time: 02/18/2021/4:54:17 PM    Final        LOS: 3 days   [facility] Pager on [email or web address]  02/20/2021, 9:03 AM

## 2021-02-21 DIAGNOSIS — G9341 Metabolic encephalopathy: Secondary | ICD-10-CM | POA: Diagnosis not present

## 2021-02-21 DIAGNOSIS — B004 Herpesviral encephalitis: Secondary | ICD-10-CM | POA: Diagnosis not present

## 2021-02-21 DIAGNOSIS — I10 Essential (primary) hypertension: Secondary | ICD-10-CM | POA: Diagnosis not present

## 2021-02-21 DIAGNOSIS — R569 Unspecified convulsions: Secondary | ICD-10-CM | POA: Diagnosis not present

## 2021-02-21 LAB — URINALYSIS, ROUTINE W REFLEX MICROSCOPIC
Bacteria, UA: NONE SEEN
Bilirubin Urine: NEGATIVE
Glucose, UA: NEGATIVE mg/dL
Ketones, ur: 20 mg/dL — AB
Leukocytes,Ua: NEGATIVE
Nitrite: NEGATIVE
Protein, ur: NEGATIVE mg/dL
Specific Gravity, Urine: 1.013 (ref 1.005–1.030)
pH: 6 (ref 5.0–8.0)

## 2021-02-21 LAB — BASIC METABOLIC PANEL
Anion gap: 10 (ref 5–15)
BUN: 9 mg/dL (ref 8–23)
CO2: 21 mmol/L — ABNORMAL LOW (ref 22–32)
Calcium: 8.1 mg/dL — ABNORMAL LOW (ref 8.9–10.3)
Chloride: 102 mmol/L (ref 98–111)
Creatinine, Ser: 0.77 mg/dL (ref 0.44–1.00)
GFR, Estimated: >60 mL/min (ref >60)
Glucose, Bld: 96 mg/dL (ref 70–99)
Potassium: 4.3 mmol/L (ref 3.5–5.1)
Sodium: 133 mmol/L — ABNORMAL LOW (ref 135–145)

## 2021-02-21 LAB — CBC
HCT: 44 % (ref 36.0–46.0)
Hemoglobin: 14.3 g/dL (ref 12.0–15.0)
MCH: 27.4 pg (ref 26.0–34.0)
MCHC: 32.5 g/dL (ref 30.0–36.0)
MCV: 84.5 fL (ref 80.0–100.0)
Platelets: 153 10*3/uL (ref 150–400)
RBC: 5.21 MIL/uL — ABNORMAL HIGH (ref 3.87–5.11)
RDW: 14.3 % (ref 11.5–15.5)
WBC: 9.4 10*3/uL (ref 4.0–10.5)
nRBC: 0 % (ref 0.0–0.2)

## 2021-02-21 LAB — MAGNESIUM: Magnesium: 2.1 mg/dL (ref 1.7–2.4)

## 2021-02-21 MED ORDER — SODIUM CHLORIDE 0.9 % IV SOLN
INTRAVENOUS | Status: DC
Start: 2021-02-21 — End: 2021-02-23

## 2021-02-21 MED ORDER — AMLODIPINE BESYLATE 5 MG PO TABS
5.0000 mg | ORAL_TABLET | Freq: Every day | ORAL | Status: DC
Start: 2021-02-21 — End: 2021-02-23
  Administered 2021-02-21 – 2021-02-23 (×3): 5 mg via ORAL
  Filled 2021-02-21 (×4): qty 1

## 2021-02-21 NOTE — Progress Notes (Signed)
[facility] PROGRESS NOTE   Robin Adams IWP:[id number] DOB: [birth year] DOA: 02/17/2021  PCP: Mar Daring, PA-C  Brief History/Interval Summary: 70 y.o. female with medical history significant of HTN, ovarian cystic masses s/p hysterectomy and bilateral oophorectomy, obesity, and tobacco dependence who had not been feeling well for several days prior to admission.  Patient appeared to be very confused to family members.  Subsequently she was brought into the emergency department for further evaluation.  Initial assessment raise concern for meningitis as well as seizure activity.  She was hospitalized for further management.  Reason for Visit: Concern for herpes encephalitis versus meningitis/seizure activity  Consultants: Neurology  Procedures:  Lumbar puncture 11/11  EEG ABNORMALITY - Lateralized periodic discharges, left hemisphere, maximal left frontotemporal region - Continuous slow, left hemisphere, maximal left frontotemporal region - background asymmetry, left<right   IMPRESSION: This study showed evidence of epileptogenicity and cortical dysfunction arising from left hemisphere, maximal left frontotemporal region likely secondary to underlying structural abnormality. This eeg pattern is on the ictal -interictal continuum with high potential for seizures. No definite seizures were seen throughout the recording.    Subjective/Interval History: Patient noted to be much calmer today compared to the last few days.  She feels that her mind is clearing up.  Still slightly distracted.  Denies any pain issues.  No headaches.  No visual disturbances.      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  MRI was done which showed abnormal findings in bilateral temporal lobes.  Seizure activity was also noted on EEG.   LP could not be done at bedside and could not be done under fluoroscopy due to  agitation.  Subsequently was done on 11/11 under moderate sedation by interventional radiology.   Cell count not suggest bacterial meningitis.  Antibacterials have been discontinued.  Remains on acyclovir.  Waiting on herpes PCR and cultures. Due to concern for seizure she remains on Keppra.   CT scan of the chest abdomen and pelvis does not show any obvious malignancy.   From a mental status standpoint patient appears to be improving.  Neurology is following.  Appreciate their assistance.  History of essential hypertension Was on amlodipine prior to admission.  Blood pressure is poorly controlled.  We will resume her amlodipine now that she is on a diet.  Hyperglycemia at time of admission HbA1c 5.8.  Glucose levels are stable.  No need to check glucose levels on a daily basis.  Hypokalemia Potassium has been repleted.  Noted to be normal today.  Magnesium 2.1.    Thyroid nodule TSH and free T4 levels are normal.  Thyroid ultrasound was done and no concerning features identified in the thyroid nodules.  No indication for biopsy based on the thyroid ultrasound report.  Outpatient monitoring.  Tobacco abuse Will need counseling when she is more oriented.  Prolonged QT interval Avoid QT prolonging medications.  Monitor on telemetry.  EKG was repeated and does not show QT prolongation.    Cardiomegaly Noted on chest x-ray.  Echocardiogram shows normal systolic function.  No significant valvular abnormalities noted.  Obesity Estimated body mass index is 40.56 kg/m as calculated from the following:   Height as of this encounter: 5\' 6"  (1.676 m).   Weight as of this encounter: 114 kg.   DVT Prophylaxis: SCDs Code Status: Full code Family Communication: No family at bedside.  Discussed with daughter yesterday. Disposition Plan: Acute inpatient rehab recommended by physical therapy  Status is: Inpatient  Remains inpatient appropriate because: Altered mental status, need for IV  antibiotics,    Medications: Scheduled:  levETIRAcetam  1,500 mg Oral BID   nicotine  14 mg Transdermal Daily   sodium chloride flush  3 mL Intravenous Once   Continuous:  sodium chloride 75 mL/hr at 02/21/21 0358   acyclovir (ZOVIRAX) [id number] mg IVPB 800 mg (02/21/21 0532)   RSW:[id number] **OR** acetaminophen, hydrALAZINE, LORazepam  Antibiotics: Anti-infectives (From admission, onward)    Start     Dose/Rate Route Frequency Ordered Stop   02/18/21 2200  acyclovir (ZOVIRAX) 800 mg in dextrose 5 % 150 mL IVPB        800 mg 166 mL/hr over 60 Minutes Intravenous Every 8 hours 02/18/21 0911     02/18/21 1300  vancomycin (VANCOREADY) IVPB 1250 mg/250 mL  Status:  Discontinued        1,250 mg 166.7 mL/hr over 90 Minutes Intravenous Every 12 hours 02/18/21 0926 02/19/21 1902   02/18/21 0415  vancomycin (VANCOCIN) 1,710 mg in sodium chloride 0.9 % 500 mL IVPB  Status:  Discontinued       Note to Pharmacy: Maximum dose of 3000mg .  See Hyperspace for full Linked Orders Report.   15 mg/kg  114 kg 250 mL/hr over 120 Minutes Intravenous Every 8 hours 02/17/21 2006 02/17/21 2010   02/17/21 2030  ampicillin (OMNIPEN) 2 g in sodium chloride 0.9 % 100 mL IVPB  Status:  Discontinued        2 g 300 mL/hr over 20 Minutes Intravenous Every 4 hours 02/17/21 2006 02/19/21 1902   02/17/21 2030  cefTRIAXone (ROCEPHIN) 2 g in sodium chloride 0.9 % 100 mL IVPB  Status:  Discontinued        2 g 200 mL/hr over 30 Minutes Intravenous Every 12 hours 02/17/21 2006 02/19/21 1902   02/17/21 2015  vancomycin (VANCOCIN) 2,850 mg in sodium chloride 0.9 % 500 mL IVPB  Status:  Discontinued       See Hyperspace for full Linked Orders Report.   25 mg/kg  114 kg 250 mL/hr over 120 Minutes Intravenous  Once 02/17/21 2006 02/17/21 2010   02/17/21 2015  vancomycin (VANCOCIN) 2,250 mg in sodium chloride 0.9 % 500 mL IVPB        2,250 mg 250 mL/hr over 120 Minutes Intravenous  Once 02/17/21 2011 02/18/21 0305    02/17/21 1830  acyclovir (ZOVIRAX) 810 mg in dextrose 5 % 150 mL IVPB        10 mg/kg  81.2 kg (Adjusted) 166.2 mL/hr over 60 Minutes Intravenous Every 8 hours 02/17/21 1825 02/18/21 1208       Objective:  Vital Signs  Vitals:   02/20/21 1934 02/20/21 2338 02/21/21 0405 02/21/21 0819  BP: (!) 168/93 124/65 (!) 165/99 (!) 160/108  Pulse: 93 93 77 86  Resp: 20 (!) 23 20 18   Temp: (!) 97.5 F (36.4 C) 98.2 F (36.8 C) 98.2 F (36.8 C) 97.8 F (36.6 C)  TempSrc: Oral Oral Oral Oral  SpO2: 97% 91% 97% 94%  Weight:      Height:        Intake/Output Summary (Last 24 hours) at 02/21/2021 0840 Last data filed at 02/21/2021 0734 Gross per 24 hour  Intake 100 ml  Output 2100 ml  Net -2000 ml    Filed Weights   02/17/21 1600  Weight: 114 kg    General appearance: Awake alert.  In no distress.  Remains distracted but less  so compared to yesterday. Resp: Clear to auscultation bilaterally.  Normal effort Cardio: S1-S2 is normal regular.  No S3-S4.  No rubs murmurs or bruit GI: Abdomen is soft.  Nontender nondistended.  Bowel sounds are present normal.  No masses organomegaly Extremities: No edema.  Moving all of her extremities Neurologic: Oriented to year month but got confused about where she was including the city.  She knew she was in [place].  No obvious focal neurological deficits.     Lab Results:  Data Reviewed: I have personally reviewed following labs and imaging studies  CBC: Recent Labs  Lab 02/17/21 1638 02/17/21 1643 02/17/21 1845 02/18/21 0345 02/19/21 0221 02/20/21 0352 02/21/21 0223  WBC 13.4*  --   --  17.3* 11.2* 11.6* 9.4  NEUTROABS 10.1*  --   --  15.3*  --   --   --   HGB 14.9   < > 15.3* 13.8 13.4 14.0 14.3  HCT 46.1*   < > 45.0 43.4 41.5 43.4 44.0  MCV 87.6  --   --  87.9 87.0 85.8 84.5  PLT 185  --   --  191 171 183 153   < > = values in this interval not displayed.     Basic Metabolic Panel: Recent Labs  Lab 02/17/21 1638  02/17/21 1643 02/17/21 1845 02/18/21 0345 02/19/21 0221 02/20/21 0352 02/21/21 0223  NA 139 141 140 136 138 136 133*  K 3.8 3.6 3.5 3.7 3.3* 3.1* 4.3  CL 105 105  --  103 107 104 102  CO2 23  --   --  25 24 22  21*  GLUCOSE 201* 194*  --  108* 95 93 96  BUN 15 16  --  11 8 7* 9  CREATININE 0.96 0.90  --  0.85 0.86 0.77 0.77  CALCIUM 8.7*  --   --  8.1* 8.0* 8.5* 8.1*  MG  --   --   --  1.9 1.9  --  2.1  PHOS  --   --   --  3.1  --   --   --      GFR: Estimated Creatinine Clearance: 86.3 mL/min (by C-G formula based on SCr of 0.77 mg/dL).  Liver Function Tests: Recent Labs  Lab 02/17/21 1638 02/18/21 0345  AST 19 17  ALT 17 18  ALKPHOS 100 95  BILITOT 0.3 0.5  PROT 6.6 6.1*  ALBUMIN 3.3* 3.1*       Coagulation Profile: Recent Labs  Lab 02/17/21 1638  INR 1.1       Recent Results (from the past 240 hour(s))  Resp Panel by RT-PCR (Flu A&B, Covid) Nasopharyngeal Swab     Status: None   Collection Time: 02/17/21  7:10 PM   Specimen: Nasopharyngeal Swab; Nasopharyngeal(NP) swabs in vial transport medium  Result Value Ref Range Status   SARS Coronavirus 2 by RT PCR NEGATIVE NEGATIVE Final    Comment: (NOTE) SARS-CoV-2 target nucleic acids are NOT DETECTED.  The SARS-CoV-2 RNA is generally detectable in upper respiratory specimens during the acute phase of infection. The lowest concentration of SARS-CoV-2 viral copies this assay can detect is 138 copies/mL. A negative result does not preclude SARS-Cov-2 infection and should not be used as the sole basis for treatment or other patient management decisions. A negative result may occur with  improper specimen collection/handling, submission of specimen other than nasopharyngeal swab, presence of viral mutation(s) within the areas targeted by this assay, and inadequate number of  viral copies(<138 copies/mL). A negative result must be combined with clinical observations, patient history, and  epidemiological information. The expected result is Negative.  Fact Sheet for Patients:  EntrepreneurPulse.com.au  Fact Sheet for Healthcare Providers:  IncredibleEmployment.be  This test is no t yet approved or cleared by the [place] FDA and  has been authorized for detection and/or diagnosis of SARS-CoV-2 by FDA under an Emergency Use Authorization (EUA). This EUA will remain  in effect (meaning this test can be used) for the duration of the COVID-19 declaration under Section 564(b)(1) of the Act, 21 U.S.C.section 360bbb-3(b)(1), unless the authorization is terminated  or revoked sooner.       Influenza A by PCR NEGATIVE NEGATIVE Final   Influenza B by PCR NEGATIVE NEGATIVE Final    Comment: (NOTE) The Xpert Xpress SARS-CoV-2/FLU/RSV plus assay is intended as an aid in the diagnosis of influenza from Nasopharyngeal swab specimens and should not be used as a sole basis for treatment. Nasal washings and aspirates are unacceptable for Xpert Xpress SARS-CoV-2/FLU/RSV testing.  Fact Sheet for Patients: EntrepreneurPulse.com.au  Fact Sheet for Healthcare Providers: IncredibleEmployment.be  This test is not yet approved or cleared by the [place] FDA and has been authorized for detection and/or diagnosis of SARS-CoV-2 by FDA under an Emergency Use Authorization (EUA). This EUA will remain in effect (meaning this test can be used) for the duration of the COVID-19 declaration under Section 564(b)(1) of the Act, 21 U.S.C. section 360bbb-3(b)(1), unless the authorization is terminated or revoked.  Performed at [facility], [place] [address] [place], [place] [address]   CSF culture w Gram Stain     Status: None (Preliminary result)   Collection Time: 02/19/21  3:46 PM   Specimen: PATH Cytology CSF; Cerebrospinal Fluid  Result Value Ref Range Status   Specimen Description CSF  Final   Special  Requests NONE  Final   Gram Stain   Final    WBC PRESENT, PREDOMINANTLY MONONUCLEAR NO ORGANISMS SEEN CYTOSPIN SMEAR    Culture   Final    NO GROWTH < 24 HOURS Performed at [facility], [place] [address] [place],  [address]    Report Status PENDING  Incomplete       Radiology Studies: MR BRAIN W CONTRAST  Result Date: 02/19/2021 CLINICAL DATA:  Seizure, abnormal neuro exam EXAM: MRI HEAD WITH CONTRAST TECHNIQUE: Multiplanar, multiecho pulse sequences of the brain and surrounding structures were obtained with intravenous contrast. CONTRAST:  72mL GADAVIST GADOBUTROL 1 MMOL/ML IV SOLN COMPARISON:  02/17/2021. FINDINGS: Brain: In the left anterior temporal lobe, there is an area of enhancement that measures approximately 2.6 x 1.3 x 1.3 cm (series 8, image 19 and series 17, image 4), which correlates with an area of increased signal on diffusion-weighted imaging and increased T2 signal on the prior exam. Another area of contrast enhancement is noted in the left posterior temporal/occipital region (series 8, image 25 and series 7, image 7), measuring up to 1.8 x 0.6 x 0.6 cm. This also correlates with an area of increased T2 signal and increased signal on diffusion-weighted imaging on the prior exam. Possible 0.7 cm area of contrast enhancement in the left pons (series 6, image 13), although this area is poorly seen on the coronal for corroboration. This does not correlate with abnormal signal on the prior exam. Vascular: Unable to evaluate Skull and upper cervical spine: Unable to evaluate. Sinuses/Orbits: Limited evaluation, appears normal. Other: None. IMPRESSION: Evaluation is limited by  motion artifact. Within this limitation, there is contrast enhancement in the left anterior temporal lobe and left temporoparietal region, which correlates with areas of restricted diffusion and increased T2 signal seen on the prior MRI. An additional area of possible contrast enhancement in the left  pons does not correlate with abnormal signal seen on the prior exam; this may be unrelated or indicate an additional area of abnormality that developed since the prior exam. This appearance remains concerning for autoimmune or viral encephalitis, including herpes encephalitis. Vasculitis is also in the differential. Seizures or subacute infarcts are felt to be less likely. Electronically Signed   By: Merilyn Baba M.D.   On: 02/19/2021 17:07   CT CHEST ABDOMEN PELVIS W CONTRAST  Result Date: 02/19/2021 CLINICAL DATA:  Encephalopathy, AMS. Concern for malignancy EXAM: CT CHEST, ABDOMEN, AND PELVIS WITH CONTRAST TECHNIQUE: Multidetector CT imaging of the chest, abdomen and pelvis was performed following the standard protocol during bolus administration of intravenous contrast. CONTRAST:  111mL OMNIPAQUE IOHEXOL 300 MG/ML  SOLN COMPARISON:  Ultrasound thyroid 02/17/2021 FINDINGS: CT CHEST FINDINGS Cardiovascular: Normal heart size. No significant pericardial effusion. The thoracic aorta is normal in caliber. Mild atherosclerotic plaque of the thoracic aorta. At least 1 vessel coronary artery calcifications. The main pulmonary artery is normal in caliber. No central pulmonary embolus. Mediastinum/Nodes:No enlarged mediastinal, hilar, or axillary lymph nodes. Trachea and esophagus demonstrate no significant findings. Tiny hiatal hernia. There is a 1.6 cm thyroid/isthmus hypodensity. This has been evaluated on previous imaging (ref: J Am Coll Radiol. 2015 Feb;12(2): 143-50). Lungs/Pleura: Thin wall cystic lesion likely benign. No focal consolidation. No pulmonary nodule. No pulmonary mass. No pleural effusion. No pneumothorax. Musculoskeletal: No chest wall abnormality. No suspicious lytic or blastic osseous lesions. No acute displaced fracture. Multilevel degenerative changes of the spine. CT ABDOMEN PELVIS FINDINGS Hepatobiliary: The hepatic parenchyma is diffusely hypodense compared to the splenic parenchyma  consistent with fatty infiltration. Subcentimeter hypodensity too small to characterize. Otherwise no focal liver abnormality. No gallstones, gallbladder wall thickening, or pericholecystic fluid. No biliary dilatation. Pancreas: Diffusely atrophic. No focal lesion. Otherwise normal pancreatic contour. No surrounding inflammatory changes. No main pancreatic ductal dilatation. Spleen: Normal in size without focal abnormality. Adrenals/Urinary Tract: No adrenal nodule bilaterally. Bilateral kidneys enhance symmetrically. No hydronephrosis. No hydroureter. The urinary bladder is unremarkable. On delayed imaging, there is no urothelial wall thickening and there are no filling defects in the opacified portions of the bilateral collecting systems or ureters. Stomach/Bowel: Stomach is within normal limits. No evidence of bowel wall thickening or dilatation. No pneumatosis. Diffuse descending colon and sigmoid colonic diverticulosis. The appendix not definitely identified. Vascular/Lymphatic: No abdominal aorta or iliac aneurysm. Mild atherosclerotic plaque of the aorta and its branches. No abdominal, pelvic, or inguinal lymphadenopathy. Reproductive: Status post hysterectomy. No adnexal masses. Other: No intraperitoneal free fluid. No intraperitoneal free gas. No organized fluid collection. Musculoskeletal: Very shallow umbilical hernia containing a short loop of small bowel. No suspicious lytic or blastic osseous lesions. No acute displaced fracture. Multilevel degenerative changes of the spine. IMPRESSION: 1. No acute intrathoracic abnormality. 2. Hepatic steatosis. 3. Diffuse descending colon and sigmoid colonic diverticulosis with no definite acute diverticulitis. 4. Very shallow umbilical hernia containing a short loop of small bowel. No findings of associated bowel obstruction or findings to suggest ischemia. Electronically Signed   By: Iven Finn M.D.   On: 02/19/2021 16:21   IR INJECT DIAG/THERA/INC  NEEDLE/CATH/PLC EPI/LUMB/SAC W/IMG  Result Date: 02/19/2021 CLINICAL DATA:  70 year old female with  concern for meningitis. EXAM: DIAGNOSTIC LUMBAR PUNCTURE UNDER FLUOROSCOPIC GUIDANCE COMPARISON:  None available. FLUOROSCOPY TIME:  Fluoroscopy Time:  1 minute, 6 seconds Radiation Exposure Index (if provided by the fluoroscopic device): 17 mGy Number of Acquired Spot Images: 1 SEDATION: Moderate (conscious) sedation was employed during this procedure. A total of Versed 2 mg and Fentanyl 75 mcg was administered intravenously. Moderate Sedation Time: 21 minutes. The patient's level of consciousness and vital signs were monitored continuously by radiology nursing throughout the procedure under my direct supervision. PROCEDURE: Informed consent was obtained from the patient's daughter prior to the procedure, including potential complications of headache, allergy, and pain. With the patient prone, the lower back was prepped with Betadine. 1% Lidocaine was used for local anesthesia. Lumbar puncture was performed at the L2-L3 level using a 20 gauge needle with return of clear CSF with an opening pressure of 22 cm water. Total of 28 ml of CSF were obtained for laboratory studies. The patient tolerated the procedure well and there were no apparent complications. IMPRESSION: Technically successful left interlaminar L2-L3 lumbar puncture. Ruthann Cancer, MD [facility] Electronically Signed   By: Ruthann Cancer M.D.   On: 02/19/2021 16:03       LOS: 4 days   [facility] Pager on [email or web address]  02/21/2021, 8:40 AM

## 2021-02-22 ENCOUNTER — Inpatient Hospital Stay (HOSPITAL_COMMUNITY): Payer: Medicare Other

## 2021-02-22 DIAGNOSIS — R569 Unspecified convulsions: Secondary | ICD-10-CM | POA: Diagnosis not present

## 2021-02-22 DIAGNOSIS — B004 Herpesviral encephalitis: Secondary | ICD-10-CM | POA: Diagnosis not present

## 2021-02-22 DIAGNOSIS — I10 Essential (primary) hypertension: Secondary | ICD-10-CM | POA: Diagnosis not present

## 2021-02-22 DIAGNOSIS — G9341 Metabolic encephalopathy: Secondary | ICD-10-CM | POA: Diagnosis not present

## 2021-02-22 LAB — MISC LABCORP TEST (SEND OUT)
Labcorp test code: 3002787
Labcorp test code: 50394
Labcorp test code: 54444

## 2021-02-22 LAB — HSV 1/2 PCR, CSF
HSV-1 DNA: NEGATIVE
HSV-2 DNA: NEGATIVE

## 2021-02-22 LAB — BASIC METABOLIC PANEL
Anion gap: 9 (ref 5–15)
BUN: 8 mg/dL (ref 8–23)
CO2: 20 mmol/L — ABNORMAL LOW (ref 22–32)
Calcium: 8.3 mg/dL — ABNORMAL LOW (ref 8.9–10.3)
Chloride: 110 mmol/L (ref 98–111)
Creatinine, Ser: 0.76 mg/dL (ref 0.44–1.00)
GFR, Estimated: >60 mL/min (ref >60)
Glucose, Bld: 111 mg/dL — ABNORMAL HIGH (ref 70–99)
Potassium: 3.2 mmol/L — ABNORMAL LOW (ref 3.5–5.1)
Sodium: 139 mmol/L (ref 135–145)

## 2021-02-22 MED ORDER — POTASSIUM CHLORIDE 20 MEQ PO PACK
40.0000 meq | PACK | Freq: Once | ORAL | Status: AC
  Administered 2021-02-22: 40 meq via ORAL
  Filled 2021-02-22: qty 2

## 2021-02-22 NOTE — Progress Notes (Signed)
Inpatient Rehab Admissions Coordinator:   I met with Pt. To discuss potential CIR admit. She is not sure is she wants to come to CIR vs. Discharge home with family and asks me to return tomorrow for her decision. I will follow up with her tomorrow.   Clemens Catholic, [place], [place] Admissions Coordinator  [phone] ([place]) [phone] (office)

## 2021-02-22 NOTE — Progress Notes (Addendum)
Physical Therapy Treatment Patient Details Name: Robin Adams MRN: [id number] DOB: 07/31/1950 Today's Date: 02/22/2021   History of Present Illness 70 y.o. female who presented to the ED via EMS for evaluation of altered mental status with confusion, difficulty following commands, and nonverbal state after being found by her daughter altered this afternoon. EEG to evaluate for seizure PMH includes: essential hypertension, ovarian cystic masses s/p hysterectomy and bilateral oophorectomy, obesity, and tobacco dependence    PT Comments    Pt received in supine and agreeable to therapy session, somewhat impulsive throughout and needing min guard to close Supervision while seated for safety. Pts daughter, Robin Adams, present and very supportive throughout. Pt is making progress towards goals as demonstrated by improved distance with household gait tasks requiring less physical assist (up to +2 minA) and no dizziness or LOB in today's session aside from decreased safety and somewhat unsteady with stand>sit transfers. Plan to incorporate stair training into next session. Emphasis on activity pacing and safety with RW. Pt remains a good candidate for CIR to improve functional independence as pt is motivated to return to prior level of function. Pt continues to benefit from PT services to progress toward functional mobility goals.      Recommendations for follow up therapy are one component of a multi-disciplinary discharge planning process, led by the attending physician.  Recommendations may be updated based on patient status, additional functional criteria and insurance authorization.  Follow Up Recommendations  Acute inpatient rehab (3hours/day)     Assistance Recommended at Discharge Frequent or constant Supervision/Assistance  Equipment Recommendations  Rolling walker (2 wheels)    Recommendations for Other Services       Precautions / Restrictions Precautions Precautions:  Fall Restrictions Weight Bearing Restrictions: No     Mobility  Bed Mobility Overal bed mobility: Needs Assistance Bed Mobility: Supine to Sit     Supine to sit: Min guard;Supervision     General bed mobility comments: No physical assist given, cues for hand placement, use of rail    Transfers Overall transfer level: Needs assistance Equipment used: Rolling walker (2 wheels) Transfers: Sit to/from Stand Sit to Stand: Min assist;Min guard    General transfer comment: Cues for safe hand placement on RW, pt is impulsive and stands before therapist cues    Ambulation/Gait Ambulation/Gait assistance: Min assist;+2 safety/equipment (Chair follow) Gait Distance (Feet): 85 Feet (85 ft, standing break, 85 ft) Assistive device: Rolling walker (2 wheels) Gait Pattern/deviations: Step-to pattern;Decreased stance time - left;Decreased stride length Gait velocity: decreased     General Gait Details: Pt able to ambulate with up to minA for safe proximity to walker, bumping into objects on R side, and impulsivity      Balance Overall balance assessment: Needs assistance Sitting-balance support: Feet supported Sitting balance-Leahy Scale: Good Sitting balance - Comments: Able to sit with no physical assist for balance   Standing balance support: Bilateral upper extremity supported;During functional activity Standing balance-Leahy Scale: Fair Standing balance comment: Static standing balance fair/good, dynamic standing balance fair, no LOB this session      Cognition Arousal/Alertness: Awake/alert Behavior During Therapy: WFL for tasks assessed/performed Overall Cognitive Status: Impaired/Different from baseline Area of Impairment: Problem solving;Safety/judgement;Following commands;Memory   Current Attention Level: Sustained Memory: Decreased short-term memory Following Commands: Follows one step commands with increased time;Follows one step commands  inconsistently Safety/Judgement: Decreased awareness of safety;Decreased awareness of deficits Awareness: Intellectual Problem Solving: Slow processing;Requires verbal cues General Comments: Follows 1 step cues fairly well, occasionally needs multiple  reminders   Daughter Robin Adams estimates pt is "75% of the way to baseline" with cognition.    Exercises Other Exercises Other Exercises: Standing: marching x10, heel raises x10    General Comments General comments (skin integrity, edema, etc.): Pt with difficulty understanding modified RPE scale, says she's 5/10 when asked how tired she is, but relates this to sleepiness, not muscle tiredness; 1/10 muscle tiredness      Pertinent Vitals/Pain Pain Assessment: No/denies pain Pain Intervention(s): Monitored during session     PT Goals (current goals can now be found in the care plan section) Acute Rehab PT Goals Patient Stated Goal: to improve and return home PT Goal Formulation: With family Time For Goal Achievement: 03/05/21 Progress towards PT goals: Progressing toward goals    Frequency    Min 3X/week      PT Plan Current plan remains appropriate       AM-PAC PT "6 Clicks" Mobility   Outcome Measure  Help needed turning from your back to your side while in a flat bed without using bedrails?: A Little Help needed moving from lying on your back to sitting on the side of a flat bed without using bedrails?: A Little Help needed moving to and from a bed to a chair (including a wheelchair)?: A Little Help needed standing up from a chair using your arms (e.g., wheelchair or bedside chair)?: A Little Help needed to walk in hospital room?: A Little Help needed climbing 3-5 steps with a railing? : A Lot 6 Click Score: 17    End of Session Equipment Utilized During Treatment: Gait belt Activity Tolerance: Patient tolerated treatment well Patient left: in chair;with call bell/phone within reach;with chair alarm set;with  family/visitor present (Pts daughter, Robin Adams, in room) Nurse Communication: Mobility status PT Visit Diagnosis: Unsteadiness on feet (R26.81);Other abnormalities of gait and mobility (R26.89);Muscle weakness (generalized) (M62.81);Difficulty in walking, not elsewhere classified (R26.2);Dizziness and giddiness (R42)     Time: 5465-6812 PT Time Calculation (min) (ACUTE ONLY): 25 min  Charges:  $Gait Training: 8-22 mins $Therapeutic Activity: 8-22 mins                     Evelene Croon, Student PTA CI: Carly P., PTA  Carly M Poff 02/22/2021, 3:46 PM

## 2021-02-22 NOTE — Care Management Important Message (Signed)
Important Message  Patient Details  Name: Robin Adams MRN: [id number] Date of Birth: [birth year]   Medicare Important Message Given:  Yes     [name] 02/22/2021, 4:09 PM

## 2021-02-22 NOTE — Progress Notes (Signed)
Subjective: Continues to improve  Exam: Vitals:   02/22/21 0056 02/22/21 0349  BP:  (!) 141/60  Pulse: 84 84  Resp: 20 (!) 21  Temp:  98.7 F (37.1 C)  SpO2: 97% 92%   Gen: In bed, NAD Resp: non-labored breathing, no acute distress Abd: soft, nt  Neuro: MS: Awake, alert, oriented to person, gives month as December, when asked location she states "I want to say [place], but I am in [place] in [place]" CN: Pupils equal round and reactive, extraocular movements intact, visual fields full, face symmetric Motor: No drift, 5/5 throughout Sensory: Intact light touch  Pertinent Labs: CSF WBC 3 CSF RBC 5 CSF Protein 38 CSF Glucose 52 CSF Crypto - negative. CSF Culture - negative.  CSF HSV PCR pending CSF VZV Ab - pending CSF HSV antibodies - pending CSF Autoimmune encephalitis panel - pendign Serum Autoimmune encephalitis panel - negative. Thyroid peroxidase ab - negative.  Thyroglobulin Ab - negatbvie.  TSH/T3/T4 - negative Serum CMV IgM - negative RSV 1:16 (nml < 1:8) EBV IgM negatvie, IgG positive.  HSV Ab - negative.   Impression: 70 yo F with AMS initially with nonverbal state. MRI shows areas of enhancement suggestive of infllamation, but CSF without any evidence of inflammation. At this point, I do think that some infections are still possible(e.g. herpes or VZV) but think that an autoimmune cause would also need to be a consideration.  That being said, given her marked improvement, I would be hesitant to add other therapies such as steroids which could cloud the picture (e.g. steroid-induced delirium, etc.).  Once VZV and HSV are returned, if negative can discontinue acyclovir.  Recommendations: 1) await HSV and VZV PCR's 2) continue acyclovir pending above 3) PT, OT 4) continue Keppra 1500 twice daily 5) repeat EEG  Roland Rack, MD [facility] [phone]  If 7pm- 7am, please page neurology on call as listed in [place].

## 2021-02-22 NOTE — Progress Notes (Signed)
EEG complete - results pending 

## 2021-02-22 NOTE — Procedures (Addendum)
Patient Name: Robin Adams  MRN: [id number]  Epilepsy Attending: Lora Havens  Referring Physician/Provider: Dr Kathrynn Speed Date: 02/22/2021 Duration: 22.28 mins   Patient history: 70 y.o. female who presented to the ED via EMS for evaluation of altered mental status with confusion, difficulty following commands, and nonverbal state after being found by her daughter altered this afternoon. EEG to evaluate for seizure   Level of alertness: Awake   AEDs during EEG study: LEV   Technical aspects: This EEG study was done with scalp electrodes positioned according to the 10-20 International system of electrode placement. Electrical activity was acquired at a sampling rate of 500Hz  and reviewed with a high frequency filter of 70Hz  and a low frequency filter of 1Hz . EEG data were recorded continuously and digitally stored.    Description: The posterior dominant rhythm consists of 8 Hz activity of moderate voltage (25-35 uV) seen predominantly in posterior head regions, asymmetric ( left< right) and reactive to eye opening and eye closing. EEG showed continuous 3 to 6 Hz theta-delta slowing in left hemisphere, maximal left frontotemporal region. Abundant spikes were also noted in left hemisphere, maximal left frontotemporal region. Hyperventilation and photic stimulation were not performed.      ABNORMALITY - Spike, left hemisphere, maximal left frontotemporal region - Continuous slow, left hemisphere, maximal left frontotemporal region - Background asymmetry, left<right   IMPRESSION: This study showed evidence of epileptogenicity and cortical dysfunction arising from left hemisphere, maximal left frontotemporal region likely secondary to underlying structural abnormality. No definite seizures were seen throughout the recording.   Rondey Fallen Barbra Sarks

## 2021-02-22 NOTE — Progress Notes (Signed)
[facility] PROGRESS NOTE   Robin Adams JEH:[id number] DOB: September 17, 1950 DOA: 02/17/2021  PCP: Mar Daring, PA-C  Brief History/Interval Summary: 70 y.o. female with medical history significant of HTN, ovarian cystic masses s/p hysterectomy and bilateral oophorectomy, obesity, and tobacco dependence who had not been feeling well for several days prior to admission.  Patient appeared to be very confused to family members.  Subsequently she was brought into the emergency department for further evaluation.  Initial assessment raise concern for meningitis as well as seizure activity.  She was hospitalized for further management.  Reason for Visit: Concern for herpes encephalitis versus meningitis/seizure activity  Consultants: Neurology  Procedures:  Lumbar puncture 11/11  EEG ABNORMALITY - Lateralized periodic discharges, left hemisphere, maximal left frontotemporal region - Continuous slow, left hemisphere, maximal left frontotemporal region - background asymmetry, left<right   IMPRESSION: This study showed evidence of epileptogenicity and cortical dysfunction arising from left hemisphere, maximal left frontotemporal region likely secondary to underlying structural abnormality. This eeg pattern is on the ictal -interictal continuum with high potential for seizures. No definite seizures were seen throughout the recording.    Subjective/Interval History: Patient sitting up at the side of the bed eating her breakfast.  Noted to be much more calmer.  Wondering when she can go home.  Denies any headaches.  No nausea vomiting.  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  MRI was done which showed abnormal findings in bilateral temporal lobes.  Seizure activity was also noted on EEG.   LP could not be done at bedside and could not be done under fluoroscopy due  to agitation.  Subsequently was done on 11/11 under moderate sedation by interventional radiology.   Cell count not suggest bacterial meningitis.  Antibacterials have been discontinued.  Remains on acyclovir.  Waiting on herpes and VZV PCR and cultures. Due to concern for seizure she remains on Keppra.   CT scan of the chest abdomen and pelvis does not show any obvious malignancy.   Patient appears to have improved from a neurological standpoint.  Neurology continues to follow.  EEG to be repeated today.  Patient remains on acyclovir.  And OT evaluation.  Inpatient rehabilitation is recommended.  History of essential hypertension Back on amlodipine.  Blood pressure noted to be better.  Continue to monitor.    Hyperglycemia at time of admission HbA1c 5.8.  Glucose levels are stable.  No need to check glucose levels on a daily basis.  Hypokalemia Replete potassium.  Magnesium was 2.1 yesterday.  Thyroid nodule TSH and free T4 levels are normal.  Thyroid ultrasound was done and no concerning features identified in the thyroid nodules.  No indication for biopsy based on the thyroid ultrasound report.  Outpatient monitoring.  Tobacco abuse Will need counseling when she is more oriented.  Dysuria Complained of dysuria yesterday.  UA does not suggest infection.  Patient reassured.  Prolonged QT interval Avoid QT prolonging medications.  Monitor on telemetry.  EKG was repeated and does not show QT prolongation.    Cardiomegaly Noted on chest x-ray.  Echocardiogram shows normal systolic function.  No significant valvular abnormalities noted.  Obesity Estimated body mass index is 40.56 kg/m as calculated from the following:   Height as of this encounter: 5\' 6"  (1.676 m).   Weight as of this encounter: 114 kg.   DVT Prophylaxis: SCDs Code Status: Full code Family Communication: Discussed  with patient and daughter. Disposition Plan: Acute inpatient rehab recommended by physical  therapy  Status is: Inpatient  Remains inpatient appropriate because: Altered mental status, need for IV antibiotics,    Medications: Scheduled:  amLODipine  5 mg Oral Daily   levETIRAcetam  1,500 mg Oral BID   nicotine  14 mg Transdermal Daily   sodium chloride flush  3 mL Intravenous Once   Continuous:  sodium chloride 75 mL/hr at 02/22/21 0931   acyclovir (ZOVIRAX) 214 860 1964 mg IVPB 800 mg (02/22/21 0653)   ZJQ:BHALPFXTKWIOX **OR** acetaminophen, hydrALAZINE, LORazepam  Antibiotics: Anti-infectives (From admission, onward)    Start     Dose/Rate Route Frequency Ordered Stop   02/18/21 2200  acyclovir (ZOVIRAX) 800 mg in dextrose 5 % 150 mL IVPB        800 mg 166 mL/hr over 60 Minutes Intravenous Every 8 hours 02/18/21 0911     02/18/21 1300  vancomycin (VANCOREADY) IVPB 1250 mg/250 mL  Status:  Discontinued        1,250 mg 166.7 mL/hr over 90 Minutes Intravenous Every 12 hours 02/18/21 0926 02/19/21 1902   02/18/21 0415  vancomycin (VANCOCIN) 1,710 mg in sodium chloride 0.9 % 500 mL IVPB  Status:  Discontinued       Note to Pharmacy: Maximum dose of 3000mg .  See Hyperspace for full Linked Orders Report.   15 mg/kg  114 kg 250 mL/hr over 120 Minutes Intravenous Every 8 hours 02/17/21 2006 02/17/21 2010   02/17/21 2030  ampicillin (OMNIPEN) 2 g in sodium chloride 0.9 % 100 mL IVPB  Status:  Discontinued        2 g 300 mL/hr over 20 Minutes Intravenous Every 4 hours 02/17/21 2006 02/19/21 1902   02/17/21 2030  cefTRIAXone (ROCEPHIN) 2 g in sodium chloride 0.9 % 100 mL IVPB  Status:  Discontinued        2 g 200 mL/hr over 30 Minutes Intravenous Every 12 hours 02/17/21 2006 02/19/21 1902   02/17/21 2015  vancomycin (VANCOCIN) 2,850 mg in sodium chloride 0.9 % 500 mL IVPB  Status:  Discontinued       See Hyperspace for full Linked Orders Report.   25 mg/kg  114 kg 250 mL/hr over 120 Minutes Intravenous  Once 02/17/21 2006 02/17/21 2010   02/17/21 2015  vancomycin (VANCOCIN)  2,250 mg in sodium chloride 0.9 % 500 mL IVPB        2,250 mg 250 mL/hr over 120 Minutes Intravenous  Once 02/17/21 2011 02/18/21 0305   02/17/21 1830  acyclovir (ZOVIRAX) 810 mg in dextrose 5 % 150 mL IVPB        10 mg/kg  81.2 kg (Adjusted) 166.2 mL/hr over 60 Minutes Intravenous Every 8 hours 02/17/21 1825 02/18/21 1208       Objective:  Vital Signs  Vitals:   02/22/21 0054 02/22/21 0056 02/22/21 0349 02/22/21 0747  BP:   (!) 141/60 (!) 147/91  Pulse: 85 84 84 84  Resp: 19 20 (!) 21 19  Temp:   98.7 F (37.1 C) 98.3 F (36.8 C)  TempSrc:   Oral Oral  SpO2: 97% 97% 92% 94%  Weight:      Height:        Intake/Output Summary (Last 24 hours) at 02/22/2021 1122 Last data filed at 02/22/2021 0737 Gross per 24 hour  Intake 1918.41 ml  Output 820 ml  Net 1098.41 ml    Filed Weights   02/17/21 1600  Weight: 114 kg  General appearance: Awake alert.  In no distress.  Less distracted Resp: Clear to auscultation bilaterally.  Normal effort Cardio: S1-S2 is normal regular.  No S3-S4.  No rubs murmurs or bruit GI: Abdomen is soft.  Nontender nondistended.  Bowel sounds are present normal.  No masses organomegaly Extremities: No edema.  Full range of motion of lower extremities. Neurologic: Oriented to place year month.  No focal neurological deficits.      Lab Results:  Data Reviewed: I have personally reviewed following labs and imaging studies  CBC: Recent Labs  Lab 02/17/21 1638 02/17/21 1643 02/17/21 1845 02/18/21 0345 02/19/21 0221 02/20/21 0352 02/21/21 0223  WBC 13.4*  --   --  17.3* 11.2* 11.6* 9.4  NEUTROABS 10.1*  --   --  15.3*  --   --   --   HGB 14.9   < > 15.3* 13.8 13.4 14.0 14.3  HCT 46.1*   < > 45.0 43.4 41.5 43.4 44.0  MCV 87.6  --   --  87.9 87.0 85.8 84.5  PLT 185  --   --  191 171 183 153   < > = values in this interval not displayed.     Basic Metabolic Panel: Recent Labs  Lab 02/18/21 0345 02/19/21 0221 02/20/21 0352  02/21/21 0223 02/22/21 0248  NA 136 138 136 133* 139  K 3.7 3.3* 3.1* 4.3 3.2*  CL 103 107 104 102 110  CO2 25 24 22  21* 20*  GLUCOSE 108* 95 93 96 111*  BUN 11 8 7* 9 8  CREATININE 0.85 0.86 0.77 0.77 0.76  CALCIUM 8.1* 8.0* 8.5* 8.1* 8.3*  MG 1.9 1.9  --  2.1  --   PHOS 3.1  --   --   --   --      GFR: Estimated Creatinine Clearance: 86.3 mL/min (by C-G formula based on SCr of 0.76 mg/dL).  Liver Function Tests: Recent Labs  Lab 02/17/21 1638 02/18/21 0345  AST 19 17  ALT 17 18  ALKPHOS 100 95  BILITOT 0.3 0.5  PROT 6.6 6.1*  ALBUMIN 3.3* 3.1*       Coagulation Profile: Recent Labs  Lab 02/17/21 1638  INR 1.1       Recent Results (from the past 240 hour(s))  Resp Panel by RT-PCR (Flu A&B, Covid) Nasopharyngeal Swab     Status: None   Collection Time: 02/17/21  7:10 PM   Specimen: Nasopharyngeal Swab; Nasopharyngeal(NP) swabs in vial transport medium  Result Value Ref Range Status   SARS Coronavirus 2 by RT PCR NEGATIVE NEGATIVE Final    Comment: (NOTE) SARS-CoV-2 target nucleic acids are NOT DETECTED.  The SARS-CoV-2 RNA is generally detectable in upper respiratory specimens during the acute phase of infection. The lowest concentration of SARS-CoV-2 viral copies this assay can detect is 138 copies/mL. A negative result does not preclude SARS-Cov-2 infection and should not be used as the sole basis for treatment or other patient management decisions. A negative result may occur with  improper specimen collection/handling, submission of specimen other than nasopharyngeal swab, presence of viral mutation(s) within the areas targeted by this assay, and inadequate number of viral copies(<138 copies/mL). A negative result must be combined with clinical observations, patient history, and epidemiological information. The expected result is Negative.  Fact Sheet for Patients:  EntrepreneurPulse.com.au  Fact Sheet for Healthcare  Providers:  IncredibleEmployment.be  This test is no t yet approved or cleared by the [place] FDA and  has  been authorized for detection and/or diagnosis of SARS-CoV-2 by FDA under an Emergency Use Authorization (EUA). This EUA will remain  in effect (meaning this test can be used) for the duration of the COVID-19 declaration under Section 564(b)(1) of the Act, 21 U.S.C.section 360bbb-3(b)(1), unless the authorization is terminated  or revoked sooner.       Influenza A by PCR NEGATIVE NEGATIVE Final   Influenza B by PCR NEGATIVE NEGATIVE Final    Comment: (NOTE) The Xpert Xpress SARS-CoV-2/FLU/RSV plus assay is intended as an aid in the diagnosis of influenza from Nasopharyngeal swab specimens and should not be used as a sole basis for treatment. Nasal washings and aspirates are unacceptable for Xpert Xpress SARS-CoV-2/FLU/RSV testing.  Fact Sheet for Patients: EntrepreneurPulse.com.au  Fact Sheet for Healthcare Providers: IncredibleEmployment.be  This test is not yet approved or cleared by the [place] FDA and has been authorized for detection and/or diagnosis of SARS-CoV-2 by FDA under an Emergency Use Authorization (EUA). This EUA will remain in effect (meaning this test can be used) for the duration of the COVID-19 declaration under Section 564(b)(1) of the Act, 21 U.S.C. section 360bbb-3(b)(1), unless the authorization is terminated or revoked.  Performed at [facility], [place] [address] [place], [place] [address]   CSF culture w Gram Stain     Status: None (Preliminary result)   Collection Time: 02/19/21  3:46 PM   Specimen: PATH Cytology CSF; Cerebrospinal Fluid  Result Value Ref Range Status   Specimen Description CSF  Final   Special Requests NONE  Final   Gram Stain   Final    WBC PRESENT, PREDOMINANTLY MONONUCLEAR NO ORGANISMS SEEN CYTOSPIN SMEAR    Culture   Final    NO GROWTH 3  DAYS Performed at [facility], [place] [address] [place], [place] [address]    Report Status PENDING  Incomplete       Radiology Studies: No results found.     LOS: 5 days   Robin Adams [employer] on [email or web address]  02/22/2021, 11:22 AM

## 2021-02-23 LAB — CSF CULTURE W GRAM STAIN: Culture: NO GROWTH

## 2021-02-23 LAB — BASIC METABOLIC PANEL
Anion gap: 7 (ref 5–15)
BUN: 8 mg/dL (ref 8–23)
CO2: 23 mmol/L (ref 22–32)
Calcium: 8.1 mg/dL — ABNORMAL LOW (ref 8.9–10.3)
Chloride: 109 mmol/L (ref 98–111)
Creatinine, Ser: 0.79 mg/dL (ref 0.44–1.00)
GFR, Estimated: >60 mL/min (ref >60)
Glucose, Bld: 107 mg/dL — ABNORMAL HIGH (ref 70–99)
Potassium: 3.3 mmol/L — ABNORMAL LOW (ref 3.5–5.1)
Sodium: 139 mmol/L (ref 135–145)

## 2021-02-23 LAB — MAGNESIUM: Magnesium: 1.8 mg/dL (ref 1.7–2.4)

## 2021-02-23 MED ORDER — POTASSIUM CHLORIDE 20 MEQ PO PACK
40.0000 meq | PACK | Freq: Two times a day (BID) | ORAL | Status: AC
  Administered 2021-02-23 (×2): 40 meq via ORAL
  Filled 2021-02-23 (×2): qty 2

## 2021-02-23 MED ORDER — AMLODIPINE BESYLATE 10 MG PO TABS
10.0000 mg | ORAL_TABLET | Freq: Every day | ORAL | Status: DC
  Administered 2021-02-24: 10 mg via ORAL
  Filled 2021-02-23: qty 1

## 2021-02-23 NOTE — Progress Notes (Addendum)
Subjective: Feels back to normal, very ready to go home.   Exam: Vitals:   02/23/21 0341 02/23/21 0726  BP: (!) 148/73 (!) 160/89  Pulse: 79 72  Resp: 18 20  Temp: 98.6 F (37 C) 98.7 F (37.1 C)  SpO2: 97% 98%   Gen: In bed, NAD Resp: non-labored breathing, no acute distress Abd: soft, nt  Neuro: MS: Awake, alert, Oriented to person, month, knows hospital but has to look at the wall to get the name of the hospital.  CN: Pupils equal round and reactive, extraocular movements intact, visual fields full, face symmetric Motor: No drift, 5/5 throughout Sensory: Intact light touch  Pertinent Labs: CSF WBC 3 CSF RBC 5 CSF Protein 38 CSF Glucose 52 CSF Crypto - negative. CSF Culture - negative.  CSF HSV PCR negative CSF VZV Ab - pending CSF Autoimmune encephalitis panel - pending Serum Autoimmune encephalitis panel - pending Thyroid peroxidase ab - negative.  Thyroglobulin Ab - negative.  TSH/T3/T4 - negative Serum CMV IgM - negative RSV 1:16 (nml < 1:8) EBV IgM negatvie, IgG positive.  HSV Ab - negative.   Impression: 70 yo F with AMS initially with nonverbal state. MRI shows areas of enhancement suggestive of infllamation, but CSF without any evidence of inflammation. At this point, with no pleocytosis, I think that VZV is very unlikely, and with negative HSV PCR, can stop acyclovir.   I do think inflammatory/autoimmune etiologies need to be considered, but this could be a monophasic course. Given her marked improvement, I would be hesitant to add other therapies such as steroids which could cloud the picture (e.g. steroid-induced delirium, etc.).    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 At this point, I would favor continuing Keppra and repeating imaging in 1 - 2 months, if changes are wrosening or similar, would then consider biopsy. If she were to re-worsen, then I would consider steroids.    Recommendations: 1) discontinue acyclovir 2) PT, OT 3) continue Keppra 1500 twice daily 4) f/u outpatient with plan to repeat imaging.   Roland Rack, MD [facility] [phone]  If 7pm- 7am, please page neurology on call as listed in [place].

## 2021-02-23 NOTE — Progress Notes (Signed)
[facility] PROGRESS NOTE   Jerre Diguglielmo EML:[id number] DOB: December 31, 1950 DOA: 02/17/2021  PCP: Mar Daring, PA-C  Brief History/Interval Summary: 70 y.o. female with medical history significant of HTN, ovarian cystic masses s/p hysterectomy and bilateral oophorectomy, obesity, and tobacco dependence who had not been feeling well for several days prior to admission.  Patient appeared to be very confused to family members.  Subsequently she was brought into the emergency department for further evaluation.  Initial assessment raise concern for meningitis as well as seizure activity.  She was hospitalized for further management.  Underwent LP, EEG.  Seen by neurology.  Mentation has improved.  See below for details.  Reason for Visit: Concern for herpes encephalitis versus meningitis/seizure activity  Consultants: Neurology  Procedures:  Lumbar puncture 11/11  EEG ABNORMALITY - Lateralized periodic discharges, left hemisphere, maximal left frontotemporal region - Continuous slow, left hemisphere, maximal left frontotemporal region - background asymmetry, left<right   IMPRESSION: This study showed evidence of epileptogenicity and cortical dysfunction arising from left hemisphere, maximal left frontotemporal region likely secondary to underlying structural abnormality. This eeg pattern is on the ictal -interictal continuum with high potential for seizures. No definite seizures were seen throughout the recording.    Subjective/Interval History: Patient noted to be irritable this morning.  Denies any headache.  No shortness of breath.  Wants to go home.    Assessment/Plan:  Acute encephalopathy/seizure MRI was done which showed abnormal findings in bilateral temporal lobes.  Seizure activity was also noted on EEG.   LP could not be done at bedside and could not be done under fluoroscopy due to agitation.  Subsequently was done on 11/11 under moderate sedation by  interventional radiology.   Cell count not suggest bacterial meningitis.  Antibacterials were discontinued.   Due to concern for seizure she remains on Keppra.   CT scan of the chest abdomen and pelvis does not show any obvious malignancy.   Initially concern was for herpes encephalitis versus meningitis.  However CSF was underwhelming.  HSV PCR is negative.  VZV PCR is still pending.  [facility] was called today and they do not have the results yet. Antibacterials were discontinued.  Per neurology acyclovir can be discontinued today.   Neurology has been following and they have inflammatory/autoimmune etiology as well as gliomatosis cerebri in their differential diagnosis.  They recommended continuing Keppra and repeat imaging study in 1 to 2 months. Patient seen by PT and OT and inpatient rehabilitation was recommended.  Patient noted to be irritable this morning.  History of essential hypertension Continue amlodipine.  Occasional high readings noted.  Will increase the dose to 10 mg daily.  Continue to monitor.  Hyperglycemia at time of admission HbA1c 5.8.  Glucose levels are stable.  No need to check glucose levels on a daily basis.  Hypokalemia Replete potassium.  Magnesium is 1.8.  Thyroid nodule TSH and free T4 levels are normal.  Thyroid ultrasound was done and no concerning features identified in the thyroid nodules.  No indication for biopsy based on the thyroid ultrasound report.  Outpatient monitoring.  Tobacco abuse Counseled.  Dysuria, transient UA did not suggest infection.    Prolonged QT interval Avoid QT prolonging medications.  Monitor on telemetry.  EKG was repeated and does not show QT prolongation.    Cardiomegaly Noted on chest x-ray.  Echocardiogram shows normal systolic function.  No significant valvular abnormalities noted.  Obesity Estimated body mass index is 40.56 kg/m as calculated from the following:  Height as of this encounter: 5\' 6"  (1.676 m).    Weight as of this encounter: 114 kg.   DVT Prophylaxis: SCDs Code Status: Full code Family Communication: Discussed with patient.  Discussed with her daughter yesterday. Disposition Plan: Acute inpatient rehab recommended by physical therapy  Status is: Inpatient  Remains inpatient appropriate because: Altered mental status, need for IV antibiotics,    Medications: Scheduled:  amLODipine  5 mg Oral Daily   levETIRAcetam  1,500 mg Oral BID   nicotine  14 mg Transdermal Daily   potassium chloride  40 mEq Oral BID   sodium chloride flush  3 mL Intravenous Once   Continuous:  sodium chloride 75 mL/hr at 02/23/21 0851   ZHY:QMVHQIONGEXBM **OR** acetaminophen, hydrALAZINE, LORazepam  Antibiotics: Anti-infectives (From admission, onward)    Start     Dose/Rate Route Frequency Ordered Stop   02/18/21 2200  acyclovir (ZOVIRAX) 800 mg in dextrose 5 % 150 mL IVPB  Status:  Discontinued        800 mg 166 mL/hr over 60 Minutes Intravenous Every 8 hours 02/18/21 0911 02/23/21 0922   02/18/21 1300  vancomycin (VANCOREADY) IVPB 1250 mg/250 mL  Status:  Discontinued        1,250 mg 166.7 mL/hr over 90 Minutes Intravenous Every 12 hours 02/18/21 0926 02/19/21 1902   02/18/21 0415  vancomycin (VANCOCIN) 1,710 mg in sodium chloride 0.9 % 500 mL IVPB  Status:  Discontinued       Note to Pharmacy: Maximum dose of 3000mg .  See Hyperspace for full Linked Orders Report.   15 mg/kg  114 kg 250 mL/hr over 120 Minutes Intravenous Every 8 hours 02/17/21 2006 02/17/21 2010   02/17/21 2030  ampicillin (OMNIPEN) 2 g in sodium chloride 0.9 % 100 mL IVPB  Status:  Discontinued        2 g 300 mL/hr over 20 Minutes Intravenous Every 4 hours 02/17/21 2006 02/19/21 1902   02/17/21 2030  cefTRIAXone (ROCEPHIN) 2 g in sodium chloride 0.9 % 100 mL IVPB  Status:  Discontinued        2 g 200 mL/hr over 30 Minutes Intravenous Every 12 hours 02/17/21 2006 02/19/21 1902   02/17/21 2015  vancomycin (VANCOCIN) 2,850  mg in sodium chloride 0.9 % 500 mL IVPB  Status:  Discontinued       See Hyperspace for full Linked Orders Report.   25 mg/kg  114 kg 250 mL/hr over 120 Minutes Intravenous  Once 02/17/21 2006 02/17/21 2010   02/17/21 2015  vancomycin (VANCOCIN) 2,250 mg in sodium chloride 0.9 % 500 mL IVPB        2,250 mg 250 mL/hr over 120 Minutes Intravenous  Once 02/17/21 2011 02/18/21 0305   02/17/21 1830  acyclovir (ZOVIRAX) 810 mg in dextrose 5 % 150 mL IVPB        10 mg/kg  81.2 kg (Adjusted) 166.2 mL/hr over 60 Minutes Intravenous Every 8 hours 02/17/21 1825 02/18/21 1208       Objective:  Vital Signs  Vitals:   02/22/21 1958 02/22/21 2358 02/23/21 0341 02/23/21 0726  BP: (!) 172/84 (!) 168/82 (!) 148/73 (!) 160/89  Pulse: 84 82 79 72  Resp: 20 20 18 20   Temp: 98.1 F (36.7 C) 98.8 F (37.1 C) 98.6 F (37 C) 98.7 F (37.1 C)  TempSrc: Oral Oral Oral Oral  SpO2: 97% 98% 97% 98%  Weight:      Height:        Intake/Output Summary (  Last 24 hours) at 02/23/2021 0922 Last data filed at 02/23/2021 0835 Gross per 24 hour  Intake 3148.22 ml  Output 400 ml  Net 2748.22 ml    Filed Weights   02/17/21 1600  Weight: 114 kg    General appearance: Awake alert.  In no distress.  Irritable Resp: Clear to auscultation bilaterally.  Normal effort Cardio: S1-S2 is normal regular.  No S3-S4.  No rubs murmurs or bruit GI: Abdomen is soft.  Nontender nondistended.  Bowel sounds are present normal.  No masses organomegaly Extremities: No edema.  Moving all of her extremities Neurologic: Oriented to place year month.  No focal neurological deficits.       Lab Results:  Data Reviewed: I have personally reviewed following labs and imaging studies  CBC: Recent Labs  Lab 02/17/21 1638 02/17/21 1643 02/17/21 1845 02/18/21 0345 02/19/21 0221 02/20/21 0352 02/21/21 0223  WBC 13.4*  --   --  17.3* 11.2* 11.6* 9.4  NEUTROABS 10.1*  --   --  15.3*  --   --   --   HGB 14.9   < > 15.3*  13.8 13.4 14.0 14.3  HCT 46.1*   < > 45.0 43.4 41.5 43.4 44.0  MCV 87.6  --   --  87.9 87.0 85.8 84.5  PLT 185  --   --  191 171 183 153   < > = values in this interval not displayed.     Basic Metabolic Panel: Recent Labs  Lab 02/18/21 0345 02/19/21 0221 02/20/21 0352 02/21/21 0223 02/22/21 0248 02/23/21 0200  NA 136 138 136 133* 139 139  K 3.7 3.3* 3.1* 4.3 3.2* 3.3*  CL 103 107 104 102 110 109  CO2 25 24 22  21* 20* 23  GLUCOSE 108* 95 93 96 111* 107*  BUN 11 8 7* 9 8 8   CREATININE 0.85 0.86 0.77 0.77 0.76 0.79  CALCIUM 8.1* 8.0* 8.5* 8.1* 8.3* 8.1*  MG 1.9 1.9  --  2.1  --  1.8  PHOS 3.1  --   --   --   --   --      GFR: Estimated Creatinine Clearance: 86.3 mL/min (by C-G formula based on SCr of 0.79 mg/dL).  Liver Function Tests: Recent Labs  Lab 02/17/21 1638 02/18/21 0345  AST 19 17  ALT 17 18  ALKPHOS 100 95  BILITOT 0.3 0.5  PROT 6.6 6.1*  ALBUMIN 3.3* 3.1*       Coagulation Profile: Recent Labs  Lab 02/17/21 1638  INR 1.1       Recent Results (from the past 240 hour(s))  Resp Panel by RT-PCR (Flu A&B, Covid) Nasopharyngeal Swab     Status: None   Collection Time: 02/17/21  7:10 PM   Specimen: Nasopharyngeal Swab; Nasopharyngeal(NP) swabs in vial transport medium  Result Value Ref Range Status   SARS Coronavirus 2 by RT PCR NEGATIVE NEGATIVE Final    Comment: (NOTE) SARS-CoV-2 target nucleic acids are NOT DETECTED.  The SARS-CoV-2 RNA is generally detectable in upper respiratory specimens during the acute phase of infection. The lowest concentration of SARS-CoV-2 viral copies this assay can detect is 138 copies/mL. A negative result does not preclude SARS-Cov-2 infection and should not be used as the sole basis for treatment or other patient management decisions. A negative result may occur with  improper specimen collection/handling, submission of specimen other than nasopharyngeal swab, presence of viral mutation(s) within  the areas targeted by this assay, and inadequate number  of viral copies(<138 copies/mL). A negative result must be combined with clinical observations, patient history, and epidemiological information. The expected result is Negative.  Fact Sheet for Patients:  EntrepreneurPulse.com.au  Fact Sheet for Healthcare Providers:  IncredibleEmployment.be  This test is no t yet approved or cleared by the [place] FDA and  has been authorized for detection and/or diagnosis of SARS-CoV-2 by FDA under an Emergency Use Authorization (EUA). This EUA will remain  in effect (meaning this test can be used) for the duration of the COVID-19 declaration under Section 564(b)(1) of the Act, 21 U.S.C.section 360bbb-3(b)(1), unless the authorization is terminated  or revoked sooner.       Influenza A by PCR NEGATIVE NEGATIVE Final   Influenza B by PCR NEGATIVE NEGATIVE Final    Comment: (NOTE) The Xpert Xpress SARS-CoV-2/FLU/RSV plus assay is intended as an aid in the diagnosis of influenza from Nasopharyngeal swab specimens and should not be used as a sole basis for treatment. Nasal washings and aspirates are unacceptable for Xpert Xpress SARS-CoV-2/FLU/RSV testing.  Fact Sheet for Patients: EntrepreneurPulse.com.au  Fact Sheet for Healthcare Providers: IncredibleEmployment.be  This test is not yet approved or cleared by the [place] FDA and has been authorized for detection and/or diagnosis of SARS-CoV-2 by FDA under an Emergency Use Authorization (EUA). This EUA will remain in effect (meaning this test can be used) for the duration of the COVID-19 declaration under Section 564(b)(1) of the Act, 21 U.S.C. section 360bbb-3(b)(1), unless the authorization is terminated or revoked.  Performed at [facility], [place] [address], [place], [place] [address]   CSF culture w Gram Stain     Status: None (Preliminary  result)   Collection Time: 02/19/21  3:46 PM   Specimen: PATH Cytology CSF; Cerebrospinal Fluid  Result Value Ref Range Status   Specimen Description CSF  Final   Special Requests NONE  Final   Gram Stain   Final    WBC PRESENT, PREDOMINANTLY MONONUCLEAR NO ORGANISMS SEEN CYTOSPIN SMEAR    Culture   Final    NO GROWTH 3 DAYS Performed at [facility], [place] [address] [place], [place] [address]    Report Status PENDING  Incomplete       Radiology Studies: EEG adult  Result Date: March 14, 2021 Lora Havens, MD     2021/03/14 11:34 AM Patient Name: Francina Beery MRN: [id number] Epilepsy Attending: Lora Havens Referring Physician/Provider: Dr Kathrynn Speed Date: 2021/03/14 Duration: 22.28 mins  Patient history: 70 y.o. female who presented to the ED via EMS for evaluation of altered mental status with confusion, difficulty following commands, and nonverbal state after being found by her daughter altered this afternoon. EEG to evaluate for seizure  Level of alertness: Awake  AEDs during EEG study: LEV  Technical aspects: This EEG study was done with scalp electrodes positioned according to the 10-20 International system of electrode placement. Electrical activity was acquired at a sampling rate of 500Hz  and reviewed with a high frequency filter of 70Hz  and a low frequency filter of 1Hz . EEG data were recorded continuously and digitally stored.  Description: The posterior dominant rhythm consists of 8 Hz activity of moderate voltage (25-35 uV) seen predominantly in posterior head regions, asymmetric ( left< right) and reactive to eye opening and eye closing. EEG showed continuous 3 to 6 Hz theta-delta slowing in left hemisphere, maximal left frontotemporal region. Abundant spikes were also noted in left hemisphere, maximal left frontotemporal region. Hyperventilation and photic stimulation were  not performed.    ABNORMALITY - Spike, left hemisphere, maximal left frontotemporal  region - Continuous slow, left hemisphere, maximal left frontotemporal region - Background asymmetry, left<right  IMPRESSION: This study showed evidence of epileptogenicity and cortical dysfunction arising from left hemisphere, maximal left frontotemporal region likely secondary to underlying structural abnormality. No definite seizures were seen throughout the recording.  [place]       LOS: 6 days   [place] Ndi[place] [employer] on [email or web address]  02/23/2021, 9:22 AM

## 2021-02-23 NOTE — Progress Notes (Signed)
Inpatient Rehab Admissions Coordinator:   I spoke with pt. And daughter and they have decided that they want to home and do outpatient therapy. PT recommendation from this AM also recommending outpatient PT.CIR will sign off at this time. TOC is aware.   Clemens Catholic, [place], [place] Admissions Coordinator  [phone] ([place]) [phone] (office)

## 2021-02-23 NOTE — Progress Notes (Addendum)
Physical Therapy Treatment Patient Details Name: Robin Adams MRN: [id number] DOB: 1951/01/08 Today's Date: 02/23/2021   History of Present Illness 70 y.o. female who presented to the ED via EMS for evaluation of altered mental status with confusion, difficulty following commands, and nonverbal state after being found by her daughter altered this afternoon. EEG to evaluate for seizure PMH includes: essential hypertension, ovarian cystic masses s/p hysterectomy and bilateral oophorectomy, obesity, and tobacco dependence    PT Comments    Pt received sitting EOB and agreeable to therapy session. Pt's daughter Lawerance Bach arrived during session. Pt has poor insight into deficits as she believes she is at her baseline and stated she thinks she could drive, therapist and daughter discouraged pt from driving. Pt's mobility and strength is improving as evidenced by increased distance with household gait tasks and performing 20 stairs. Pt is safer while using RW for ambulation and needed heavy reinforcement that the RW is there to help keep her safe and improve her functional independence. Discussed follow up recommendation with supervising PT to change to neuro outpatient PT as pt has progressed and no longer wishes to transfer to CIR, also recommend family arrange for constant supervision/assist once home initially due to cognitive deficit and decreased insight into deficits. Pt continues to benefit from PT services to progress toward functional mobility goals.      Recommendations for follow up therapy are one component of a multi-disciplinary discharge planning process, led by the attending physician.  Recommendations may be updated based on patient status, additional functional criteria and insurance authorization.  Follow Up Recommendations  Outpatient PT (Neuro outpatient PT)     Assistance Recommended at Discharge Frequent or constant Supervision/Assistance  Equipment Recommendations  Rolling walker  (2 wheels)    Recommendations for Other Services       Precautions / Restrictions Precautions Precautions: Fall Restrictions Weight Bearing Restrictions: No     Mobility  Bed Mobility  General bed mobility comments: Sitting EOB upon arrival and exit    Transfers Overall transfer level: Needs assistance Equipment used: Rolling walker (2 wheels) Transfers: Sit to/from Stand Sit to Stand: Min guard   General transfer comment: Pt remains impulsive and stands before cued, but no physical assist needed    Ambulation/Gait Ambulation/Gait assistance: Min assist;Min guard Gait Distance (Feet): 200 Feet Assistive device: None Gait Pattern/deviations: Step-to pattern;Decreased stance time - left;Decreased stride length;Scissoring Gait velocity: decreased     General Gait Details: Pt ambulates with up to minA with cues for step sequencing, upright posture, and activity pacing; pt occasionally begins to scissor feet when not using AD; HHA offered but not utilized   Stairs Stairs: Yes Stairs assistance: Min guard Stair Management: Two rails;Step to pattern;Forwards Number of Stairs: 20 General stair comments: Utilized portable 7" step with walker over top to simulate railing, pt able to complete 20 steps with no DOE and no breaks     Balance Overall balance assessment: Needs assistance Sitting-balance support: Feet supported Sitting balance-Leahy Scale: Good Sitting balance - Comments: Able to sit with no physical assist for balance   Standing balance support: During functional activity;No upper extremity supported Standing balance-Leahy Scale: Fair Standing balance comment: Static standing balance fair/good, dynamic standing balance fair     Standardized Balance Assessment Standardized Balance Assessment : Dynamic Gait Index   Dynamic Gait Index Level Surface: Mild Impairment Change in Gait Speed: Moderate Impairment Gait with Horizontal Head Turns: Moderate  Impairment Gait with Vertical Head Turns: Mild Impairment  Cognition Arousal/Alertness: Awake/alert Behavior During Therapy: WFL for tasks assessed/performed Overall Cognitive Status: Impaired/Different from baseline Area of Impairment: Problem solving;Safety/judgement;Following commands;Memory      Current Attention Level: Sustained Memory: Decreased short-term memory;Decreased recall of precautions Following Commands: Follows one step commands with increased time;Follows one step commands consistently Safety/Judgement: Decreased awareness of safety;Decreased awareness of deficits Awareness: Intellectual Problem Solving: Slow processing;Requires verbal cues General Comments: Follows 1 step cues fairly well, occasionally needs multiple reminders; pt has poor insight into deficits and thinks she is back to normal, stating that she thinks she can drive, therapist discouraged pt from returning to driving until cleared by her doctor        Exercises Other Exercises Other Exercises: Standing: marching x10, heel raises x10, hamstring curls x10    General Comments General comments (skin integrity, edema, etc.): Performed part of DGI for challenge while ambulating, even if pt scored perfect on rest of DGI her score would be 18/24, indicating increased risk of falls for older community living adults.      Pertinent Vitals/Pain Pain Assessment: No/denies pain Pain Intervention(s): Monitored during session     PT Goals (current goals can now be found in the care plan section) Acute Rehab PT Goals Patient Stated Goal: to improve and return home PT Goal Formulation: With family Time For Goal Achievement: 03/05/21 Progress towards PT goals: Progressing toward goals    Frequency    Min 3X/week      PT Plan Discharge plan needs to be updated       AM-PAC PT "6 Clicks" Mobility   Outcome Measure  Help needed turning from your back to your side while in a flat bed without  using bedrails?: None Help needed moving from lying on your back to sitting on the side of a flat bed without using bedrails?: A Little Help needed moving to and from a bed to a chair (including a wheelchair)?: A Little Help needed standing up from a chair using your arms (e.g., wheelchair or bedside chair)?: A Little Help needed to walk in hospital room?: A Little Help needed climbing 3-5 steps with a railing? : A Lot 6 Click Score: 18    End of Session Equipment Utilized During Treatment: Gait belt Activity Tolerance: Patient tolerated treatment well Patient left: with call bell/phone within reach;with family/visitor present;in bed (Pts daughter, Lawerance Bach, in room) Nurse Communication: Mobility status PT Visit Diagnosis: Unsteadiness on feet (R26.81);Other abnormalities of gait and mobility (R26.89);Muscle weakness (generalized) (M62.81);Difficulty in walking, not elsewhere classified (R26.2);Dizziness and giddiness (R42)     Time: 8250-0370 PT Time Calculation (min) (ACUTE ONLY): 17 min  Charges:  $Gait Training: 8-22 mins                     Evelene Croon, Student PTA CI: Carly P., PTA  Carly M Poff 02/23/2021, 11:26 AM

## 2021-02-23 NOTE — TOC Initial Note (Signed)
Transition of Care [facility]) - Initial/Assessment Note    Patient Details  Name: Robin Adams MRN: [id number] Date of Birth: 1950/05/29  Transition of Care [facility]) CM/SW Contact:    Robin Friar, RN Phone Number: 02/23/2021, 12:17 PM  Clinical Narrative:                 Patient is from home with her daughter who can provide needed supervision and assistance. Daughter states they have a walker at home. She will pick up a shower seat outside the hospital setting.  Pt and daughter deny any issues with transportation.  Pt was not on any medications at home prior to hospitalization. Pt doesn't have prescription coverage through her medicare. CM has encourage them to look into this for the future. Daughter feels they will be able to afford her d/c medications. Recommendations are for outpatient therapy. Daughter and patient interested in pt attending at [facility]. Orders in Epic and information is on the AVS.  TOC following.  Expected Discharge Plan: OP Rehab Barriers to Discharge: Continued Medical Work up   Patient Goals and CMS Choice   CMS Medicare.gov Compare Post Acute Care list provided to:: Patient Choice offered to / list presented to : Patient, Adult Children  Expected Discharge Plan and Services Expected Discharge Plan: OP Rehab   Discharge Planning Services: CM Consult   Living arrangements for the past 2 months: Single Family Home                                      Prior Living Arrangements/Services Living arrangements for the past 2 months: Single Family Home Lives with:: Adult Children Patient language and need for interpreter reviewed:: Yes Do you feel safe going back to the place where you live?: Yes      Need for Family Participation in Patient Care: Yes (Comment) Care giver support system in place?: Yes (comment) Current home services: DME (walker/ bars in shower) Criminal Activity/Legal Involvement Pertinent to Current Situation/Hospitalization: No -  Comment as needed  Activities of Daily Living Home Assistive Devices/Equipment: None ADL Screening (condition at time of admission) Patient's cognitive ability adequate to safely complete daily activities?: No Is the patient deaf or have difficulty hearing?: No Does the patient have difficulty seeing, even when wearing glasses/contacts?: No Does the patient have difficulty concentrating, remembering, or making decisions?: Yes Patient able to express need for assistance with ADLs?: No Does the patient have difficulty dressing or bathing?: Yes Independently performs ADLs?: No Communication: Dependent Is this a change from baseline?: Change from baseline, expected to last <3 days Dressing (OT): Dependent Is this a change from baseline?: Change from baseline, expected to last <3days Grooming: Dependent Is this a change from baseline?: Change from baseline, expected to last <3 days Feeding: Dependent Is this a change from baseline?: Change from baseline, expected to last <3 days Bathing: Dependent Is this a change from baseline?: Change from baseline, expected to last <3 days Toileting: Dependent Is this a change from baseline?: Change from baseline, expected to last <3 days In/Out Bed: Dependent Is this a change from baseline?: Change from baseline, expected to last <3 days Walks in Home: Dependent Is this a change from baseline?: Change from baseline, expected to last <3 days Does the patient have difficulty walking or climbing stairs?: Yes Weakness of Legs: Both Weakness of Arms/Hands: Both  Permission Sought/Granted  Emotional Assessment Appearance:: Appears stated age Attitude/Demeanor/Rapport: Engaged Affect (typically observed): Accepting Orientation: : Oriented to Self, Oriented to Place, Oriented to  Time, Oriented to Situation   Psych Involvement: No (comment)  Admission diagnosis:  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] Thyroid lesion [E07.9] Patient  Active Problem List   Diagnosis Date Noted   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   Hyperglycemia 30/16/0109   Acute metabolic encephalopathy 32/35/5732   Tobacco abuse 02/17/2021   Thyroid nodule 02/17/2021   Essential hypertension 02/17/2021   Obesity, Class III, BMI 40-49.9 (morbid obesity) ([place]) 02/17/2021   Cardiomegaly 02/17/2021   Prolonged QT interval 02/17/2021   Ovarian cyst 10/17/2018   Thickened endometrium 10/17/2018   PCP:  Robin Daring, PA-C Pharmacy:   [id number] [address] [address] Phone: [phone] Fax: [phone]     Social Determinants of Health (SDOH) Interventions    Readmission Risk Interventions No flowsheet data found.

## 2021-02-23 NOTE — Progress Notes (Signed)
Occupational Therapy Treatment Patient Details Name: Robin Adams MRN: [id number] DOB: 30-Oct-1950 Today's Date: 02/23/2021   History of present illness 70 y.o. female who presented to the ED via EMS for evaluation of altered mental status with confusion, difficulty following commands, and nonverbal state after being found by her daughter altered this afternoon. EEG to evaluate for seizure PMH includes: essential hypertension, ovarian cystic masses s/p hysterectomy and bilateral oophorectomy, obesity, and tobacco dependence   OT comments  Robin Adams is making great progress towards her acute OT goals. She was able to complete all transfers and functional mobility within the room without AD with supervision A for safety. Pt declined use of RW, stated she did not need it and "never have." Educated on use of AD to increase safety and reduce fall risk. Pt follow 3 step way finding task with no cues, and supervision for safety. Recommend pt have her daughter assist with IADLs such as cooking, med mgmt and bills, however pt stated she does not see the need. Pt continues to be limited by limited insight into safety and deficits. Pt's d/c recommendation updated to neuro OP OT   Recommendations for follow up therapy are one component of a multi-disciplinary discharge planning process, led by the attending physician.  Recommendations may be updated based on patient status, additional functional criteria and insurance authorization.    Follow Up Recommendations  Outpatient OT (Neuro OP OT)    Assistance Recommended at Discharge Frequent or constant Supervision/Assistance        Precautions / Restrictions Precautions Precautions: Fall Restrictions Weight Bearing Restrictions: No       Mobility Bed Mobility Overal bed mobility: Modified Independent        Transfers Overall transfer level: Needs assistance   Transfers: Sit to/from Stand Sit to Stand: Supervision           General transfer  comment: pt is impulsive wtih mvoements, and declined use of RW     Balance Overall balance assessment: Needs assistance Sitting-balance support: Feet supported Sitting balance-Leahy Scale: Good Sitting balance - Comments: Able to sit with no physical assist for balance   Standing balance support: During functional activity;No upper extremity supported Standing balance-Leahy Scale: Good                             ADL either performed or assessed with clinical judgement   ADL Overall ADL's : Needs assistance/impaired Eating/Feeding: Independent;Sitting   Grooming: Wash/dry hands;Supervision/safety;Standing;Wash/dry Lobbyist Transfer: Supervision/safety;Ambulation Toilet Transfer Details (indicate cue type and reason): pt declined use of RW, supervision for safety. no overt LOB         Functional mobility during ADLs: Supervision/safety General ADL Comments: pt alert and oriented this session, only one verbal cue required for safety. Supervision for OOB standing tasks without RW    Extremity/Trunk Assessment Upper Extremity Assessment Upper Extremity Assessment: Overall WFL for tasks assessed   Lower Extremity Assessment Lower Extremity Assessment: Defer to PT evaluation        Vision   Additional Comments: overall WFL for ADLs and cognitive tasks assessed   Perception Perception Perception: Within Functional Limits   Praxis      Cognition Arousal/Alertness: Awake/alert Behavior During Therapy: WFL for tasks assessed/performed Overall Cognitive Status: Impaired/Different from baseline Area of Impairment: Problem solving;Safety/judgement;Following commands;Memory  Current Attention Level: Sustained Memory: Decreased short-term memory;Decreased recall of precautions Following Commands: Follows one step commands with increased time;Follows one step commands consistently Safety/Judgement: Decreased  awareness of safety;Decreased awareness of deficits Awareness: Intellectual Problem Solving: Slow processing;Requires verbal cues General Comments: pt demonstrated poor insight into her deficits. several times throughout session stating "i'm fine." and "I dont need that" in regards to RW, shower chair, or compensatory techqniues to incrsae safety. Educated pt on having assistance for IADL tasks such as med mgmt and paying bill, however she does not see the need. Pt is now agreeable to not drive for "a few days"                     Pertinent Vitals/ Pain       Pain Assessment: No/denies pain Pain Intervention(s): Monitored during session   Frequency  Min 2X/week        Progress Toward Goals  OT Goals(current goals can now be found in the care plan section)  Progress towards OT goals: Progressing toward goals  Acute Rehab OT Goals OT Goal Formulation: With patient Time For Goal Achievement: 03/05/21 Potential to Achieve Goals: Good ADL Goals Pt Will Perform Grooming: with supervision;standing Pt Will Perform Lower Body Bathing: with min assist;sitting/lateral leans;sit to/from stand Pt Will Perform Lower Body Dressing: with min assist;sitting/lateral leans;sit to/from stand Pt Will Transfer to Toilet: with min guard assist;ambulating Pt Will Perform Toileting - Clothing Manipulation and hygiene: with min guard assist;sitting/lateral leans;sit to/from stand Additional ADL Goal #1: Pt will complete a multistep/pathfinding task with minimal assist.  Plan Discharge plan needs to be updated       AM-PAC OT "6 Clicks" Daily Activity     Outcome Measure   Help from another person eating meals?: None Help from another person taking care of personal grooming?: A Little Help from another person toileting, which includes using toliet, bedpan, or urinal?: A Little Help from another person bathing (including washing, rinsing, drying)?: A Little Help from another person to put on  and taking off regular upper body clothing?: None Help from another person to put on and taking off regular lower body clothing?: A Little 6 Click Score: 20    End of Session Equipment Utilized During Treatment: Gait belt  OT Visit Diagnosis: Unsteadiness on feet (R26.81);Other abnormalities of gait and mobility (R26.89);Muscle weakness (generalized) (M62.81)   Activity Tolerance Patient tolerated treatment well   Patient Left in chair;with call bell/phone within reach;with chair alarm set   Nurse Communication Mobility status        Time: 1219-7588 OT Time Calculation (min): 16 min  Charges: OT General Charges $OT Visit: 1 Visit OT Treatments $Self Care/Home Management : 8-22 mins   Dionisio Aragones A Tavious Griesinger 02/23/2021, 4:42 PM

## 2021-02-24 ENCOUNTER — Other Ambulatory Visit (HOSPITAL_COMMUNITY): Payer: Self-pay

## 2021-02-24 DIAGNOSIS — G40901 Epilepsy, unspecified, not intractable, with status epilepticus: Principal | ICD-10-CM

## 2021-02-24 LAB — OLIGOCLONAL BANDS, CSF + SERM

## 2021-02-24 LAB — BASIC METABOLIC PANEL
Anion gap: 7 (ref 5–15)
BUN: 6 mg/dL — ABNORMAL LOW (ref 8–23)
CO2: 23 mmol/L (ref 22–32)
Calcium: 8.4 mg/dL — ABNORMAL LOW (ref 8.9–10.3)
Chloride: 105 mmol/L (ref 98–111)
Creatinine, Ser: 0.75 mg/dL (ref 0.44–1.00)
GFR, Estimated: >60 mL/min (ref >60)
Glucose, Bld: 102 mg/dL — ABNORMAL HIGH (ref 70–99)
Potassium: 3.7 mmol/L (ref 3.5–5.1)
Sodium: 135 mmol/L (ref 135–145)

## 2021-02-24 MED ORDER — AMLODIPINE BESYLATE 10 MG PO TABS
10.0000 mg | ORAL_TABLET | Freq: Every day | ORAL | 0 refills | Status: AC
  Filled 2021-02-24: qty 30, 30d supply, fill #0

## 2021-02-24 MED ORDER — LEVETIRACETAM 750 MG PO TABS
1500.0000 mg | ORAL_TABLET | Freq: Two times a day (BID) | ORAL | 0 refills | Status: DC
  Filled 2021-02-24: qty 120, 30d supply, fill #0

## 2021-02-24 NOTE — Discharge Summary (Signed)
Physician Discharge Summary  Robin Adams JQB:[id number] DOB: Dec 28, 1950 DOA: 02/17/2021  PCP: Mar Daring, PA-C  Admit date: 02/17/2021 Discharge date: 02/24/2021  Admitted From: home Disposition:  home w/ daughter  Recommendations for Outpatient Follow-up:  Follow up with PCP in 1-2 weeks, neuology in 1 months  Home Health:no  Equipment/Devices: none  Discharge Condition: Stable Code Status:   Code Status: Full Code Diet recommendation:  Diet Order             Diet regular Room service appropriate? Yes with Assist; Fluid consistency: Thin  Diet effective now                 Brief/Interim Summary: 70 y.o. female with medical history significant of HTN, ovarian cystic masses s/p hysterectomy and bilateral oophorectomy, obesity, and tobacco dependence who had not been feeling well for several days prior to admission.  Patient appeared to be very confused to family members.  Subsequently she was brought into the emergency department for further evaluation.  Initial assessment raise concern for meningitis as well as seizure activity.  She was hospitalized for further management. Underwent LP, EEG.  Seen by neurology.  Mentation has improved.  At this time she is alert oriented x3 reactive baseline normal status, ambulatory seen by PT OT, skilled should her daughter.  Instructed not to drive and follow-up with neurology as outpatient. 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  Seizure activity was also noted on EEG.   LP could not be done at bedside and could not be done under fluoroscopy due to agitation.  Subsequently was done on 11/11 under moderate sedation by interventional radiology.   Cell count not suggest bacterial meningitis.  Antibacterials were discontinued.   CT scan of the chest abdomen and pelvis  does not show any obvious malignancy. Initially concern was for herpes encephalitis versus meningitis.  However CSF was underwhelming.HSV PCR is negative.VZV PCR was pending. Antibacterials were discontinued.  Acyclovir was discontinued 11/15.  At this time there is advised to continue on Keppra indefinitely seizure precautions,no driving and outpatient follow-up with neurology-and outpatient reimaging in 1 to 2 months if change or worsening or similar then could consider brain biopsy.  Patient verbalized understanding. Syncope DVT at this time she is stable.  She is requesting for discharge this morning.  Patient to be discharged under daughter's care today who she lives with.   Essential hypertension: Not taking amlodipine at home, will prescribe it  Follow-up with PCP  Hyperglycemia at time of admission: HbA1c 5.8.  Glucose levels are stable.   Hypokalemia:Repleted Thyroid nodule: TSH and free T4 levels are normal.  Thyroid ultrasound was done and no concerning features identified in the thyroid nodules.  No indication for biopsy based on the thyroid ultrasound report.  Outpatient monitoring.  Tobacco abuse:Counseled.   Dysuria, transient:UA did not suggest infection.  No new complaints  Prolonged QT interval-resolved.EKG was repeated and does not show QT prolongation.   Cardiomegaly: Noted on chest x-ray.  Echocardiogram shows normal systolic function.  No significant valvular abnormalities noted  Per [facility] statutes, patients with seizures are not allowed to drive until  they have been seizure-free for six months. Use caution when using heavy equipment or power tools. Avoid working on ladders or at heights. Take showers instead of baths. Ensure the water temperature is not too high on  the home water heater. Do not go swimming alone. When caring for infants or small children, sit down when holding, feeding, or changing them to minimize risk of injury to the child in the event you  have a seizure.  Maintain good sleep hygiene. Avoid alcohol.    Consults: Neurology  Procedures:  Lumbar puncture 11/11   EEG ABNORMALITY - Lateralized periodic discharges, left hemisphere, maximal left frontotemporal region - Continuous slow, left hemisphere, maximal left frontotemporal region - background asymmetry, left<right   IMPRESSION: This study showed evidence of epileptogenicity and cortical dysfunction arising from left hemisphere, maximal left frontotemporal region likely secondary to underlying structural abnormality. This eeg pattern is on the ictal -interictal continuum with high potential for seizures. No definite seizures were seen throughout the recording.  Subjective: Aa0x3, requesting for discharge home this morning. " If not discharged I am ready to walk out of the door"  Discharge Exam: Vitals:   02/24/21 0327 02/24/21 0737  BP: (!) 156/82 (!) 157/99  Pulse: 78 74  Resp: 16 14  Temp: 98.6 F (37 C) 98.1 F (36.7 C)  SpO2: 93% 97%   General: Pt is alert, awake, not in acute distress Cardiovascular: RRR, S1/S2 +, no rubs, no gallops Respiratory: CTA bilaterally, no wheezing, no rhonchi Abdominal: Soft, NT, ND, bowel sounds + Extremities: no edema, no cyanosis  Discharge Instructions  Discharge Instructions     Ambulatory referral to Neurology   Complete by: As directed    An appointment is requested in approximately: 2 weeks   Ambulatory referral to Occupational Therapy   Complete by: As directed    Ambulatory referral to Physical Therapy   Complete by: As directed    Ambulatory referral to Speech Therapy   Complete by: As directed    Discharge instructions   Complete by: As directed    Per [facility] statutes, patients with seizures are not allowed to drive until  they have been seizure-free for six months. Use caution when using heavy equipment or power tools. Avoid working on ladders or at heights. Take showers instead of baths.  Ensure the water temperature is not too high on the home water heater. Do not go swimming alone. When caring for infants or small children, sit down when holding, feeding, or changing them to minimize risk of injury to the child in the event you have a seizure.  Maintain good sleep hygiene. Avoid alcohol.   Please call call MD or return to ER for similar or worsening recurring problem that brought you to hospital or if any fever,nausea/vomiting,abdominal pain, uncontrolled pain, chest pain,  shortness of breath or any other alarming symptoms.  Please follow-up your doctor as instructed in a week time and call the office for appointment.  Please avoid alcohol, smoking, or any other illicit substance and maintain healthy habits including taking your regular medications as prescribed.  You were cared for by a hospitalist during your hospital stay. If you have any questions about your discharge medications or the care you received while you were in the hospital after you are discharged, you can call the unit and ask to speak with the hospitalist on call if the hospitalist that took care of you is not available.  Once you are discharged, your primary care physician will handle any further medical issues. Please note that NO REFILLS for any discharge medications will be authorized once you are discharged, as it is imperative that you return to your primary care physician (or establish a  relationship with a primary care physician if you do not have one) for your aftercare needs so that they can reassess your need for medications and monitor your lab values   Increase activity slowly   Complete by: As directed    No wound care   Complete by: As directed       Allergies as of 02/24/2021       Reactions   Penicillins    Tetanus Toxoids Hives        Medication List     TAKE these medications    acetaminophen 500 MG tablet Commonly known as: TYLENOL Take 1,000 mg by mouth every 6 (six) hours  as needed for moderate pain or headache.   amLODipine 10 MG tablet Commonly known as: NORVASC Take 1 tablet (10 mg total) by mouth daily. What changed:  medication strength how much to take   ibuprofen 200 MG tablet Commonly known as: ADVIL Take 400 mg by mouth every 6 (six) hours as needed for headache or moderate pain.   levETIRAcetam 750 MG tablet Commonly known as: KEPPRA Take 2 tablets (1,500 mg total) by mouth 2 (two) times daily.   MULTI-VITAMIN PO Take 1 tablet by mouth daily.        Follow-up Information     [facility]. Schedule an appointment as soon as possible for a visit in 1 week(s).   Specialty: Rehabilitation Contact information: [place] [phone] [place] [phone]        Mar Daring, PA-C Follow up in 1 week(s).   Specialty: Family Medicine Contact information: [place] [address] [phone]                Allergies  Allergen Reactions   Penicillins    Tetanus Toxoids Hives    The results of significant diagnostics from this hospitalization (including imaging, microbiology, ancillary and laboratory) are listed below for reference.    Microbiology: Recent Results (from the past 240 hour(s))  Resp Panel by RT-PCR (Flu A&B, Covid) Nasopharyngeal Swab     Status: None   Collection Time: 02/17/21  7:10 PM   Specimen: Nasopharyngeal Swab; Nasopharyngeal(NP) swabs in vial transport medium  Result Value Ref Range Status   SARS Coronavirus 2 by RT PCR NEGATIVE NEGATIVE Final    Comment: (NOTE) SARS-CoV-2 target nucleic acids are NOT DETECTED.  The SARS-CoV-2 RNA is generally detectable in upper respiratory specimens during the acute phase of infection. The lowest concentration of SARS-CoV-2 viral copies this assay can detect is 138 copies/mL. A negative result does not preclude SARS-Cov-2 infection and should  not be used as the sole basis for treatment or other patient management decisions. A negative result may occur with  improper specimen collection/handling, submission of specimen other than nasopharyngeal swab, presence of viral mutation(s) within the areas targeted by this assay, and inadequate number of viral copies(<138 copies/mL). A negative result must be combined with clinical observations, patient history, and epidemiological information. The expected result is Negative.  Fact Sheet for Patients:  EntrepreneurPulse.com.au  Fact Sheet for Healthcare Providers:  IncredibleEmployment.be  This test is no t yet approved or cleared by the [place] FDA and  has been authorized for detection and/or diagnosis of SARS-CoV-2 by FDA under an Emergency Use Authorization (EUA). This EUA will remain  in effect (meaning this test can be used) for the duration of the COVID-19 declaration under Section 564(b)(1) of the Act,  21 U.S.C.section 360bbb-3(b)(1), unless the authorization is terminated  or revoked sooner.       Influenza A by PCR NEGATIVE NEGATIVE Final   Influenza B by PCR NEGATIVE NEGATIVE Final    Comment: (NOTE) The Xpert Xpress SARS-CoV-2/FLU/RSV plus assay is intended as an aid in the diagnosis of influenza from Nasopharyngeal swab specimens and should not be used as a sole basis for treatment. Nasal washings and aspirates are unacceptable for Xpert Xpress SARS-CoV-2/FLU/RSV testing.  Fact Sheet for Patients: EntrepreneurPulse.com.au  Fact Sheet for Healthcare Providers: IncredibleEmployment.be  This test is not yet approved or cleared by the [place] FDA and has been authorized for detection and/or diagnosis of SARS-CoV-2 by FDA under an Emergency Use Authorization (EUA). This EUA will remain in effect (meaning this test can be used) for the duration of the COVID-19 declaration under  Section 564(b)(1) of the Act, 21 U.S.C. section 360bbb-3(b)(1), unless the authorization is terminated or revoked.  Performed at [facility], [place] [address], [place], [place] [address]   CSF culture w Gram Stain     Status: None   Collection Time: 02/19/21  3:46 PM   Specimen: PATH Cytology CSF; Cerebrospinal Fluid  Result Value Ref Range Status   Specimen Description CSF  Final   Special Requests NONE  Final   Gram Stain   Final    WBC PRESENT, PREDOMINANTLY MONONUCLEAR NO ORGANISMS SEEN CYTOSPIN SMEAR    Culture   Final    NO GROWTH 3 DAYS Performed at [facility], [place] [address], [place], [place] [address]    Report Status 02/23/2021 FINAL  Final    Procedures/Studies: MR BRAIN WO CONTRAST  Result Date: 02/17/2021 CLINICAL DATA:  Neuro deficit, acute, stroke suspected EXAM: MRI HEAD WITHOUT CONTRAST TECHNIQUE: Multiplanar, multiecho pulse sequences of the brain and surrounding structures were obtained without intravenous contrast. COMPARISON:  No prior MRI, correlation made with CT head 02/17/2021. FINDINGS: Brain: Restricted diffusion with ADC correlate, most prominent in the in the bilateral anterior temporal lobes, left greater than right medial temporal lobes, and left temporoparietal region. These areas are associated with increased T2 signal and gyral swelling. No hemorrhage, mass, mass effect, or midline shift. T2 hyperintense signal in the periventricular white matter, likely the sequela of chronic small vessel ischemic disease. Vascular: Normal flow voids. Skull and upper cervical spine: Normal marrow signal. Sinuses/Orbits: Negative. Other: The mastoids are well aerated. IMPRESSION: Restricted diffusion and increased T2 signal, most prominent in the bilateral anterior temporal lobes, left greater than right medial temporal lobes, and left temporoparietal region, which is nonspecific but could be seen in the setting of viral or autoimmune encephalitis. Seizures  can also appears similar. Electronically Signed   By: Merilyn Baba M.D.   On: 02/17/2021 18:13   MR BRAIN W CONTRAST  Result Date: 02/19/2021 CLINICAL DATA:  Seizure, abnormal neuro exam EXAM: MRI HEAD WITH CONTRAST TECHNIQUE: Multiplanar, multiecho pulse sequences of the brain and surrounding structures were obtained with intravenous contrast. CONTRAST:  11mL GADAVIST GADOBUTROL 1 MMOL/ML IV SOLN COMPARISON:  02/17/2021. FINDINGS: Brain: In the left anterior temporal lobe, there is an area of enhancement that measures approximately 2.6 x 1.3 x 1.3 cm (series 8, image 19 and series 17, image 4), which correlates with an area of increased signal on diffusion-weighted imaging and increased T2 signal on the prior exam. Another area of contrast enhancement is noted in the left posterior temporal/occipital region (series 8, image 25 and series 7, image  7), measuring up to 1.8 x 0.6 x 0.6 cm. This also correlates with an area of increased T2 signal and increased signal on diffusion-weighted imaging on the prior exam. Possible 0.7 cm area of contrast enhancement in the left pons (series 6, image 13), although this area is poorly seen on the coronal for corroboration. This does not correlate with abnormal signal on the prior exam. Vascular: Unable to evaluate Skull and upper cervical spine: Unable to evaluate. Sinuses/Orbits: Limited evaluation, appears normal. Other: None. IMPRESSION: Evaluation is limited by motion artifact. Within this limitation, there is contrast enhancement in the left anterior temporal lobe and left temporoparietal region, which correlates with areas of restricted diffusion and increased T2 signal seen on the prior MRI. An additional area of possible contrast enhancement in the left pons does not correlate with abnormal signal seen on the prior exam; this may be unrelated or indicate an additional area of abnormality that developed since the prior exam. This appearance remains concerning for  autoimmune or viral encephalitis, including herpes encephalitis. Vasculitis is also in the differential. Seizures or subacute infarcts are felt to be less likely. Electronically Signed   By: Merilyn Baba M.D.   On: 02/19/2021 17:07   CT CHEST ABDOMEN PELVIS W CONTRAST  Result Date: 02/19/2021 CLINICAL DATA:  Encephalopathy, AMS. Concern for malignancy EXAM: CT CHEST, ABDOMEN, AND PELVIS WITH CONTRAST TECHNIQUE: Multidetector CT imaging of the chest, abdomen and pelvis was performed following the standard protocol during bolus administration of intravenous contrast. CONTRAST:  122mL OMNIPAQUE IOHEXOL 300 MG/ML  SOLN COMPARISON:  Ultrasound thyroid 02/17/2021 FINDINGS: CT CHEST FINDINGS Cardiovascular: Normal heart size. No significant pericardial effusion. The thoracic aorta is normal in caliber. Mild atherosclerotic plaque of the thoracic aorta. At least 1 vessel coronary artery calcifications. The main pulmonary artery is normal in caliber. No central pulmonary embolus. Mediastinum/Nodes:No enlarged mediastinal, hilar, or axillary lymph nodes. Trachea and esophagus demonstrate no significant findings. Tiny hiatal hernia. There is a 1.6 cm thyroid/isthmus hypodensity. This has been evaluated on previous imaging (ref: J Am Coll Radiol. 2015 Feb;12(2): 143-50). Lungs/Pleura: Thin wall cystic lesion likely benign. No focal consolidation. No pulmonary nodule. No pulmonary mass. No pleural effusion. No pneumothorax. Musculoskeletal: No chest wall abnormality. No suspicious lytic or blastic osseous lesions. No acute displaced fracture. Multilevel degenerative changes of the spine. CT ABDOMEN PELVIS FINDINGS Hepatobiliary: The hepatic parenchyma is diffusely hypodense compared to the splenic parenchyma consistent with fatty infiltration. Subcentimeter hypodensity too small to characterize. Otherwise no focal liver abnormality. No gallstones, gallbladder wall thickening, or pericholecystic fluid. No biliary  dilatation. Pancreas: Diffusely atrophic. No focal lesion. Otherwise normal pancreatic contour. No surrounding inflammatory changes. No main pancreatic ductal dilatation. Spleen: Normal in size without focal abnormality. Adrenals/Urinary Tract: No adrenal nodule bilaterally. Bilateral kidneys enhance symmetrically. No hydronephrosis. No hydroureter. The urinary bladder is unremarkable. On delayed imaging, there is no urothelial wall thickening and there are no filling defects in the opacified portions of the bilateral collecting systems or ureters. Stomach/Bowel: Stomach is within normal limits. No evidence of bowel wall thickening or dilatation. No pneumatosis. Diffuse descending colon and sigmoid colonic diverticulosis. The appendix not definitely identified. Vascular/Lymphatic: No abdominal aorta or iliac aneurysm. Mild atherosclerotic plaque of the aorta and its branches. No abdominal, pelvic, or inguinal lymphadenopathy. Reproductive: Status post hysterectomy. No adnexal masses. Other: No intraperitoneal free fluid. No intraperitoneal free gas. No organized fluid collection. Musculoskeletal: Very shallow umbilical hernia containing a short loop of small bowel. No suspicious lytic or  blastic osseous lesions. No acute displaced fracture. Multilevel degenerative changes of the spine. IMPRESSION: 1. No acute intrathoracic abnormality. 2. Hepatic steatosis. 3. Diffuse descending colon and sigmoid colonic diverticulosis with no definite acute diverticulitis. 4. Very shallow umbilical hernia containing a short loop of small bowel. No findings of associated bowel obstruction or findings to suggest ischemia. Electronically Signed   By: Iven Finn M.D.   On: 02/19/2021 16:21   DG Chest Port 1 View  Result Date: 02/17/2021 CLINICAL DATA:  Shortness of breath EXAM: PORTABLE CHEST 1 VIEW COMPARISON:  Portable exam 1823 hours compared to 01/02/2017 FINDINGS: Enlargement of cardiac silhouette with pulmonary vascular  congestion. Decreased lung volumes versus previous exam with mild bibasilar atelectasis. Scattered interstitial prominence, question mild pulmonary edema. No pleural effusion or pneumothorax. Bones demineralized. IMPRESSION: Enlargement of cardiac silhouette with pulmonary vascular congestion and interstitial prominence question pulmonary edema. Bibasilar atelectasis. Electronically Signed   By: Lavonia Dana M.D.   On: 02/17/2021 18:35   EEG adult  Result Date: 02/22/2021 Lora Havens, MD     02/22/2021 11:34 AM Patient Name: Jameriah Trotti MRN: [id number] Epilepsy Attending: Lora Havens Referring Physician/Provider: Dr Kathrynn Speed Date: 02/22/2021 Duration: 22.28 mins  Patient history: 70 y.o. female who presented to the ED via EMS for evaluation of altered mental status with confusion, difficulty following commands, and nonverbal state after being found by her daughter altered this afternoon. EEG to evaluate for seizure  Level of alertness: Awake  AEDs during EEG study: LEV  Technical aspects: This EEG study was done with scalp electrodes positioned according to the 10-20 International system of electrode placement. Electrical activity was acquired at a sampling rate of 500Hz  and reviewed with a high frequency filter of 70Hz  and a low frequency filter of 1Hz . EEG data were recorded continuously and digitally stored.  Description: The posterior dominant rhythm consists of 8 Hz activity of moderate voltage (25-35 uV) seen predominantly in posterior head regions, asymmetric ( left< right) and reactive to eye opening and eye closing. EEG showed continuous 3 to 6 Hz theta-delta slowing in left hemisphere, maximal left frontotemporal region. Abundant spikes were also noted in left hemisphere, maximal left frontotemporal region. Hyperventilation and photic stimulation were not performed.    ABNORMALITY - Spike, left hemisphere, maximal left frontotemporal region - Continuous slow, left hemisphere,  maximal left frontotemporal region - Background asymmetry, left<right  IMPRESSION: This study showed evidence of epileptogenicity and cortical dysfunction arising from left hemisphere, maximal left frontotemporal region likely secondary to underlying structural abnormality. No definite seizures were seen throughout the recording.  Priyanka Barbra Sarks   Overnight EEG with video  Result Date: 02/18/2021 Lora Havens, MD     02/19/2021  9:09 AM Patient Name: Shaneeka Scarboro MRN: [id number] Epilepsy Attending: Lora Havens Referring Physician/Provider: Dr Lesleigh Noe Duration: 02/17/2021 2047 to 02/18/2021 1349  Patient history: 70 y.o. female who presented to the ED via EMS for evaluation of altered mental status with confusion, difficulty following commands, and nonverbal state after being found by her daughter altered this afternoon. EEG to evaluate for seizure  Level of alertness: Awake, asleep  AEDs during EEG study: None  Technical aspects: This EEG study was done with scalp electrodes positioned according to the 10-20 International system of electrode placement. Electrical activity was acquired at a sampling rate of 500Hz  and reviewed with a high frequency filter of 70Hz  and a low frequency filter of 1Hz . EEG data were recorded continuously and digitally stored.  Description: The posterior dominant rhythm consists of 8 Hz activity of moderate voltage (25-35 uV) seen predominantly in posterior head regions, asymmetric ( left< right) and reactive to eye opening and eye closing. EEG showed continuous 3 to 6 Hz theta-delta slowing in left hemisphere, maximal left frontotemporal region. Lateralized periodic discharges were also noted in left hemisphere, maximal left frontotemporal region with fluctuation frequency between 0.5-1hz . Hyperventilation and photic stimulation were not performed.    ABNORMALITY - Lateralized periodic discharges, left hemisphere, maximal left frontotemporal region - Continuous slow,  left hemisphere, maximal left frontotemporal region - background asymmetry, left<right  IMPRESSION: This study showed evidence of epileptogenicity and cortical dysfunction arising from left hemisphere, maximal left frontotemporal region likely secondary to underlying structural abnormality. This eeg pattern is on the ictal -interictal continuum with high potential for seizures. No definite seizures were seen throughout the recording.  Priyanka Barbra Sarks   IR INJECT DIAG/THERA/INC NEEDLE/CATH/PLC EPI/LUMB/SAC W/IMG  Result Date: 02/19/2021 CLINICAL DATA:  70 year old female with concern for meningitis. EXAM: DIAGNOSTIC LUMBAR PUNCTURE UNDER FLUOROSCOPIC GUIDANCE COMPARISON:  None available. FLUOROSCOPY TIME:  Fluoroscopy Time:  1 minute, 6 seconds Radiation Exposure Index (if provided by the fluoroscopic device): 17 mGy Number of Acquired Spot Images: 1 SEDATION: Moderate (conscious) sedation was employed during this procedure. A total of Versed 2 mg and Fentanyl 75 mcg was administered intravenously. Moderate Sedation Time: 21 minutes. The patient's level of consciousness and vital signs were monitored continuously by radiology nursing throughout the procedure under my direct supervision. PROCEDURE: Informed consent was obtained from the patient's daughter prior to the procedure, including potential complications of headache, allergy, and pain. With the patient prone, the lower back was prepped with Betadine. 1% Lidocaine was used for local anesthesia. Lumbar puncture was performed at the L2-L3 level using a 20 gauge needle with return of clear CSF with an opening pressure of 22 cm water. Total of 28 ml of CSF were obtained for laboratory studies. The patient tolerated the procedure well and there were no apparent complications. IMPRESSION: Technically successful left interlaminar L2-L3 lumbar puncture. Ruthann Cancer, MD [facility] Electronically Signed    By: Ruthann Cancer M.D.   On: 02/19/2021 16:03   ECHOCARDIOGRAM COMPLETE  Result Date: 02/18/2021    ECHOCARDIOGRAM REPORT   Patient Name:   DARCEL ZICK Date of Exam: 02/18/2021 Medical Rec #:  [id number]    Height:       66.0 in Accession #:    [id number]   Weight:       251.3 lb Date of Birth:  06-24-1950    BSA:          2.204 m Patient Age:    43 years     BP:           110/66 mmHg Patient Gender: F            HR:           82 bpm. Exam Location:  Inpatient Procedure: 2D Echo, Cardiac Doppler and Color Doppler Indications:    Cardiomegaly  History:        Patient has no prior history of Echocardiogram examinations.                 Risk Factors:Hypertension.  Sonographer:    Jyl Heinz Referring Phys: [place]  1. Left ventricular ejection fraction, by estimation, is 60 to 65%. The left ventricle has normal function. The left ventricle has no regional  wall motion abnormalities. There is mild concentric left ventricular hypertrophy. Left ventricular diastolic parameters were normal.  2. Right ventricular systolic function is normal. The right ventricular size is normal. Tricuspid regurgitation signal is inadequate for assessing PA pressure.  3. The mitral valve is normal in structure. No evidence of mitral valve regurgitation. No evidence of mitral stenosis.  4. The aortic valve is normal in structure. Aortic valve regurgitation is not visualized. Mild aortic valve sclerosis is present, with no evidence of aortic valve stenosis.  5. The inferior vena cava is normal in size with greater than 50% respiratory variability, suggesting right atrial pressure of 3 mmHg. Comparison(s): No prior Echocardiogram. FINDINGS  Left Ventricle: Left ventricular ejection fraction, by estimation, is 60 to 65%. The left ventricle has normal function. The left ventricle has no regional wall motion abnormalities. The left ventricular internal cavity size was normal in size. There is  mild concentric left  ventricular hypertrophy. Left ventricular diastolic parameters were normal. Right Ventricle: The right ventricular size is normal. No increase in right ventricular wall thickness. Right ventricular systolic function is normal. Tricuspid regurgitation signal is inadequate for assessing PA pressure. Left Atrium: Left atrial size was normal in size. Right Atrium: Right atrial size was normal in size. Pericardium: There is no evidence of pericardial effusion. Mitral Valve: The mitral valve is normal in structure. No evidence of mitral valve regurgitation. No evidence of mitral valve stenosis. Tricuspid Valve: The tricuspid valve is normal in structure. Tricuspid valve regurgitation is not demonstrated. No evidence of tricuspid stenosis. Aortic Valve: The aortic valve is normal in structure. Aortic valve regurgitation is not visualized. Mild aortic valve sclerosis is present, with no evidence of aortic valve stenosis. Aortic valve peak gradient measures 12.8 mmHg. Pulmonic Valve: The pulmonic valve was normal in structure. Pulmonic valve regurgitation is trivial. No evidence of pulmonic stenosis. Aorta: The aortic root is normal in size and structure. Venous: The inferior vena cava is normal in size with greater than 50% respiratory variability, suggesting right atrial pressure of 3 mmHg. IAS/Shunts: No atrial level shunt detected by color flow Doppler.  LEFT VENTRICLE PLAX 2D LVIDd:         4.30 cm      Diastology LVIDs:         3.60 cm      LV e' medial:    7.40 cm/s LV PW:         1.30 cm      LV E/e' medial:  8.5 LV IVS:        1.30 cm      LV e' lateral:   8.92 cm/s LVOT diam:     2.00 cm      LV E/e' lateral: 7.1 LV SV:         60 LV SV Index:   27 LVOT Area:     3.14 cm  LV Volumes (MOD) LV vol d, MOD A2C: 87.6 ml LV vol d, MOD A4C: 110.0 ml LV vol s, MOD A2C: 41.6 ml LV vol s, MOD A4C: 45.3 ml LV SV MOD A2C:     46.0 ml LV SV MOD A4C:     110.0 ml LV SV MOD BP:      56.7 ml RIGHT VENTRICLE             IVC RV  Basal diam:  3.40 cm     IVC diam: 2.00 cm RV Mid diam:    2.80 cm RV S prime:  15.10 cm/s TAPSE (M-mode): 2.7 cm LEFT ATRIUM             Index        RIGHT ATRIUM           Index LA diam:        3.50 cm 1.59 cm/m   RA Area:     16.30 cm LA Vol (A2C):   50.9 ml 23.10 ml/m  RA Volume:   40.90 ml  18.56 ml/m LA Vol (A4C):   43.6 ml 19.79 ml/m LA Biplane Vol: 48.4 ml 21.96 ml/m  AORTIC VALVE AV Area (Vmax): 1.76 cm AV Vmax:        179.00 cm/s AV Peak Grad:   12.8 mmHg LVOT Vmax:      100.00 cm/s LVOT Vmean:     67.600 cm/s LVOT VTI:       0.190 m  AORTA Ao Root diam: 2.80 cm Ao Asc diam:  3.10 cm MITRAL VALVE MV Area (PHT): 4.68 cm    SHUNTS MV Decel Time: 162 msec    Systemic VTI:  0.19 m MV E velocity: 62.90 cm/s  Systemic Diam: 2.00 cm MV A velocity: 64.30 cm/s MV E/A ratio:  0.98 Kardie Tobb DO Electronically signed by Berniece Salines DO Signature Date/Time: 02/18/2021/4:54:17 PM    Final    US THYROID  Result Date: 02/18/2021 CLINICAL DATA:  Thyroid lesion EXAM: THYROID ULTRASOUND TECHNIQUE: Ultrasound examination of the thyroid gland and adjacent soft tissues was performed. COMPARISON:  None. FINDINGS: Parenchymal Echotexture: Mildly heterogeneous Isthmus: 0.5 cm Right lobe: 3.4 x 1.3 x 1.8 cm Left lobe: 4.6 x 1.8 x 1.5 cm _________________________________________________________ Estimated total number of nodules >/= 1 cm: 2 Number of spongiform nodules >/=  2 cm not described below (TR1): 0 Number of mixed cystic and solid nodules >/= 1.5 cm not described below (TR2): 0 _________________________________________________________ Nodule # 1: Location: Isthmus; superior Maximum size: 3.1 cm; Other 2 dimensions: 2.6 x 2.0 cm Composition: mixed cystic and solid (1) Echogenicity: isoechoic (1) Shape: not taller-than-wide (0) Margins: smooth (0) Echogenic foci: none (0) ACR TI-RADS total points: 2. ACR TI-RADS risk category: TR2 (2 points). ACR TI-RADS recommendations: This nodule does NOT meet TI-RADS  criteria for biopsy or dedicated follow-up. _________________________________________________________ 0.9 x 0.6 x 0.8 cm cystic nodule in the right superior thyroid lobe does not meet criteria for imaging surveillance or FNA. IMPRESSION: Isthmus and right thyroid nodules do not meet criteria for FNA or imaging surveillance. No suspicious thyroid nodules. The above is in keeping with the ACR TI-RADS recommendations - J Am Coll Radiol 2017;14:587-595. Electronically Signed   By: Miachel Roux M.D.   On: 02/18/2021 07:37   CT HEAD CODE STROKE WO CONTRAST  Result Date: 02/17/2021 CLINICAL DATA:  Code stroke. Neuro deficit, acute, stroke suspected. Confusion. EXAM: CT HEAD WITHOUT CONTRAST TECHNIQUE: Contiguous axial images were obtained from the base of the skull through the vertex without intravenous contrast. COMPARISON:  12/16/2011 FINDINGS: Brain: No acute finding by CT. Mild chronic small-vessel ischemic changes of the white matter in left basal ganglia. No identifiable acute infarction, mass lesion, hemorrhage, hydrocephalus or extra-axial collection. Vascular: There is atherosclerotic calcification of the major vessels at the base of the brain. Skull: Negative Sinuses/Orbits: Clear/normal Other: None ASPECTS ([place] Stroke Program Early CT Score) - Ganglionic level infarction (caudate, lentiform nuclei, internal capsule, insula, M1-M3 cortex): 7 - Supraganglionic infarction (M4-M6 cortex): 3 Total score (0-10 with 10 being normal): 10 IMPRESSION: 1. No acute finding  by CT. Mild chronic small-vessel ischemic change of the cerebral hemispheric white matter and left basal ganglia. 2. These results were communicated to Dr. Curly Shores at 4:49 pm on Mar 17, 2021 by text page via the [facility] messaging system. 3. ASPECTS is 10 Electronically Signed   By: Nelson Chimes M.D.   On: Mar 17, 2021 16:50   CT ANGIO HEAD NECK W WO CM W PERF (CODE STROKE)  Result Date: March 17, 2021 CLINICAL DATA:  Aphasia, confusion, stroke suspected  EXAM: CT ANGIOGRAPHY HEAD AND NECK CT PERFUSION BRAIN TECHNIQUE: Multidetector CT imaging of the head and neck was performed using the standard protocol during bolus administration of intravenous contrast. Multiplanar CT image reconstructions and MIPs were obtained to evaluate the vascular anatomy. Carotid stenosis measurements (when applicable) are obtained utilizing NASCET criteria, using the distal internal carotid diameter as the denominator. Multiphase CT imaging of the brain was performed following IV bolus contrast injection. Subsequent parametric perfusion maps were calculated using RAPID software. CONTRAST:  110mL OMNIPAQUE IOHEXOL 350 MG/ML SOLN COMPARISON:  03-17-2021 CT head codes stroke, 01/13/2012 CT head. FINDINGS: CT HEAD FINDINGS Please see same day CT head code stroke for noncontrast findings. CTA NECK FINDINGS Aortic arch: Standard branching. Imaged portion shows no evidence of aneurysm or dissection. No significant stenosis of the major arch vessel origins. Right carotid system: No evidence of dissection, stenosis (50% or greater) or occlusion. Left carotid system: No evidence of dissection, stenosis (50% or greater) or occlusion. Vertebral arteries: Codominant. No evidence of dissection, stenosis (50% or greater) or occlusion. Skeleton: No acute osseous abnormality. Other neck: Hypoattenuating lesion in the thyroid isthmus measures up to 1.5 x 1.9 x 2.8 cm. The neck is otherwise negative. Upper chest: Dependent atelectasis. No pleural effusion or focal pulmonary opacity. Review of the MIP images confirms the above findings CTA HEAD FINDINGS Anterior circulation: Both internal carotid arteries are patent to the termini, without stenosis or other abnormality. A1 segments patent. Normal anterior communicating artery. Anterior cerebral arteries are patent to their distal aspects. No M1 stenosis or occlusion. Normal MCA bifurcations. Distal MCA branches perfused and symmetric. Posterior circulation:  Vertebral arteries widely patent to the vertebrobasilar junction without stenosis. Posterior inferior cerebral arteries patent bilaterally. Basilar patent to its distal aspect. Superior cerebral arteries patent bilaterally. Bilateral posterior communicating arteries are visualized. Diminutive right P1, with near fetal origin of the right PCA. Normal left P1. PCAs well perfused to their distal aspects without stenosis. Venous sinuses: As permitted by contrast timing, patent. Anatomic variants: None significant Review of the MIP images confirms the above findings CT Brain Perfusion Findings: ASPECTS: 10 CBF (<30%) Volume: 38mL Perfusion (Tmax>6.0s) volume: 49mL Mismatch Volume: 59mL Infarction Location:None IMPRESSION: 1. No intracranial large vessel occlusion or hemodynamically significant stenosis. 2. No hemodynamically significant stenosis in the neck. 3. CT perfusion demonstrates no infarct core or penumbra. 4. Incidental note is made of hypoattenuating lesion in the thyroid isthmus, measuring up to 2.8 cm. If this has not previously been evaluated, an ultrasound of the thyroid is recommended. Electronically Signed   By: Merilyn Baba M.D.   On: 03-17-21 17:17    Labs: BNP (last 3 results) Recent Labs    02/18/21 0345  BNP 233.0*   Basic Metabolic Panel: Recent Labs  Lab 02/18/21 0345 02/19/21 0221 02/20/21 0352 02/21/21 0223 02/22/21 0248 02/23/21 0200 02/24/21 0208  NA 136 138 136 133* 139 139 135  K 3.7 3.3* 3.1* 4.3 3.2* 3.3* 3.7  CL 103 107 104 102 110 109 105  CO2  25 24 22  21* 20* 23 23  GLUCOSE 108* 95 93 96 111* 107* 102*  BUN 11 8 7* 9 8 8  6*  CREATININE 0.85 0.86 0.77 0.77 0.76 0.79 0.75  CALCIUM 8.1* 8.0* 8.5* 8.1* 8.3* 8.1* 8.4*  MG 1.9 1.9  --  2.1  --  1.8  --   PHOS 3.1  --   --   --   --   --   --    Liver Function Tests: Recent Labs  Lab 02/17/21 1638 02/18/21 0345  AST 19 17  ALT 17 18  ALKPHOS 100 95  BILITOT 0.3 0.5  PROT 6.6 6.1*  ALBUMIN 3.3* 3.1*   No  results for input(s): LIPASE, AMYLASE in the last 168 hours. No results for input(s): AMMONIA in the last 168 hours. CBC: Recent Labs  Lab 02/17/21 1638 02/17/21 1643 02/17/21 1845 02/18/21 0345 02/19/21 0221 02/20/21 0352 02/21/21 0223  WBC 13.4*  --   --  17.3* 11.2* 11.6* 9.4  NEUTROABS 10.1*  --   --  15.3*  --   --   --   HGB 14.9   < > 15.3* 13.8 13.4 14.0 14.3  HCT 46.1*   < > 45.0 43.4 41.5 43.4 44.0  MCV 87.6  --   --  87.9 87.0 85.8 84.5  PLT 185  --   --  191 171 183 153   < > = values in this interval not displayed.   Cardiac Enzymes: No results for input(s): CKTOTAL, CKMB, CKMBINDEX, TROPONINI in the last 168 hours. BNP: Invalid input(s): POCBNP CBG: Recent Labs  Lab 02/18/21 1607 02/18/21 1950 02/18/21 2349 02/19/21 0338 02/19/21 0811  GLUCAP 95 95 98 97 96   D-Dimer No results for input(s): DDIMER in the last 72 hours. Hgb A1c No results for input(s): HGBA1C in the last 72 hours. Lipid Profile No results for input(s): CHOL, HDL, LDLCALC, TRIG, CHOLHDL, LDLDIRECT in the last 72 hours. Thyroid function studies No results for input(s): TSH, T4TOTAL, T3FREE, THYROIDAB in the last 72 hours.  Invalid input(s): FREET3 Anemia work up No results for input(s): VITAMINB12, FOLATE, FERRITIN, TIBC, IRON, RETICCTPCT in the last 72 hours. Urinalysis    Component Value Date/Time   COLORURINE YELLOW 02/21/2021 1114   APPEARANCEUR CLEAR 02/21/2021 1114   LABSPEC 1.013 02/21/2021 1114   PHURINE 6.0 02/21/2021 1114   GLUCOSEU NEGATIVE 02/21/2021 1114   HGBUR SMALL (A) 02/21/2021 1114   BILIRUBINUR NEGATIVE 02/21/2021 1114   KETONESUR 20 (A) 02/21/2021 1114   PROTEINUR NEGATIVE 02/21/2021 1114   NITRITE NEGATIVE 02/21/2021 1114   LEUKOCYTESUR NEGATIVE 02/21/2021 1114   Sepsis Labs Invalid input(s): PROCALCITONIN,  WBC,  LACTICIDVEN Microbiology Recent Results (from the past 240 hour(s))  Resp Panel by RT-PCR (Flu A&B, Covid) Nasopharyngeal Swab     Status:  None   Collection Time: 02/17/21  7:10 PM   Specimen: Nasopharyngeal Swab; Nasopharyngeal(NP) swabs in vial transport medium  Result Value Ref Range Status   SARS Coronavirus 2 by RT PCR NEGATIVE NEGATIVE Final    Comment: (NOTE) SARS-CoV-2 target nucleic acids are NOT DETECTED.  The SARS-CoV-2 RNA is generally detectable in upper respiratory specimens during the acute phase of infection. The lowest concentration of SARS-CoV-2 viral copies this assay can detect is 138 copies/mL. A negative result does not preclude SARS-Cov-2 infection and should not be used as the sole basis for treatment or other patient management decisions. A negative result may occur with  improper specimen collection/handling, submission  of specimen other than nasopharyngeal swab, presence of viral mutation(s) within the areas targeted by this assay, and inadequate number of viral copies(<138 copies/mL). A negative result must be combined with clinical observations, patient history, and epidemiological information. The expected result is Negative.  Fact Sheet for Patients:  EntrepreneurPulse.com.au  Fact Sheet for Healthcare Providers:  IncredibleEmployment.be  This test is no t yet approved or cleared by the [place] FDA and  has been authorized for detection and/or diagnosis of SARS-CoV-2 by FDA under an Emergency Use Authorization (EUA). This EUA will remain  in effect (meaning this test can be used) for the duration of the COVID-19 declaration under Section 564(b)(1) of the Act, 21 U.S.C.section 360bbb-3(b)(1), unless the authorization is terminated  or revoked sooner.       Influenza A by PCR NEGATIVE NEGATIVE Final   Influenza B by PCR NEGATIVE NEGATIVE Final    Comment: (NOTE) The Xpert Xpress SARS-CoV-2/FLU/RSV plus assay is intended as an aid in the diagnosis of influenza from Nasopharyngeal swab specimens and should not be used as a sole basis for  treatment. Nasal washings and aspirates are unacceptable for Xpert Xpress SARS-CoV-2/FLU/RSV testing.  Fact Sheet for Patients: EntrepreneurPulse.com.au  Fact Sheet for Healthcare Providers: IncredibleEmployment.be  This test is not yet approved or cleared by the [place] FDA and has been authorized for detection and/or diagnosis of SARS-CoV-2 by FDA under an Emergency Use Authorization (EUA). This EUA will remain in effect (meaning this test can be used) for the duration of the COVID-19 declaration under Section 564(b)(1) of the Act, 21 U.S.C. section 360bbb-3(b)(1), unless the authorization is terminated or revoked.  Performed at [facility], [place] [address] [place], [place] [address]   CSF culture w Gram Stain     Status: None   Collection Time: 02/19/21  3:46 PM   Specimen: PATH Cytology CSF; Cerebrospinal Fluid  Result Value Ref Range Status   Specimen Description CSF  Final   Special Requests NONE  Final   Gram Stain   Final    WBC PRESENT, PREDOMINANTLY MONONUCLEAR NO ORGANISMS SEEN CYTOSPIN SMEAR    Culture   Final    NO GROWTH 3 DAYS Performed at [facility], [place] [address], [place], [place] [address]    Report Status 02/23/2021 FINAL  Final     Time coordinating discharge: 35 minutes  SIGNED: Antonieta Pert, MD  [facility] 02/24/2021, 10:59 AM  If 7PM-7AM, please contact night-coverage [email or web address]

## 2021-02-24 NOTE — TOC Transition Note (Signed)
Transition of Care [facility]) - CM/SW Discharge Note   Patient Details  Name: Katreena Schupp MRN: [id number] Date of Birth: 11/22/1950  [facility] CM/SW Contact:  Pollie Friar, RN Phone Number: 02/24/2021, 10:12 AM   Clinical Narrative:    Patient is discharging home with outpatient therapy. Information on the AVS.  Pt has transport home.   Final next level of care: OP Rehab Barriers to Discharge: No Barriers Identified   Patient Goals and CMS Choice   CMS Medicare.gov Compare Post Acute Care list provided to:: Patient Choice offered to / list presented to : Patient, Adult Children  Discharge Placement                       Discharge Plan and Services   Discharge Planning Services: CM Consult                                 Social Determinants of Health (SDOH) Interventions     Readmission Risk Interventions No flowsheet data found.

## 2021-02-24 NOTE — Progress Notes (Signed)
Subjective: No issues overnight.   Exam: Vitals:   02/24/21 0327 02/24/21 0737  BP: (!) 156/82 (!) 157/99  Pulse: 78 74  Resp: 16 14  Temp: 98.6 F (37 C) 98.1 F (36.7 C)  SpO2: 93% 97%   Gen: In bed, NAD Resp: non-labored breathing, no acute distress Abd: soft, nt  Neuro: MS: Awake, alert, Oriented to person, month, able to spell world backwards quickly, able to give # of quarters in $2.75 quickly.  CN: Pupils equal round and reactive, extraocular movements intact, visual fields full, face symmetric Motor: No drift, 5/5 throughout Sensory: Intact light touch  Pertinent Labs: CSF WBC 3 CSF RBC 5 CSF Protein 38 CSF Glucose 52 CSF Crypto - negative. CSF Culture - negative.  CSF HSV PCR negative CSF VZV Ab - pending CSF Autoimmune encephalitis panel - pending Serum Autoimmune encephalitis panel - pending Thyroid peroxidase ab - negative.  Thyroglobulin Ab - negative.  TSH/T3/T4 - negative Serum CMV IgM - negative RSV 1:16 (nml < 1:8) EBV IgM negatvie, IgG positive.  HSV Ab - negative.   Impression: 70 yo F with AMS initially with nonverbal state. MRI shows areas of enhancement suggestive of infllamation, but CSF without any evidence of inflammation. At this point, with no pleocytosis, I think that VZV is very unlikely, and with negative HSV PCR, can stop acyclovir.   I do think inflammatory/autoimmune etiologies need to be considered, but even if that is the case this could be a monophasic course. Given her marked improvement, I would be hesitant to add other therapies such as steroids which could cloud the picture (e.g. steroid-induced delirium, etc.). With no clinical target currently, I would not favor that approach.     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 At this point, I would favor continuing Keppra and repeating imaging in 1 - 2 months, if changes are worsening or similar, would then consider  biopsy. If she were to re-worsen, then I would consider steroids and biopsy.   Recommendations: 1) continue Keppra 1500 twice daily 2) If she were to worsen, would start steroids and consider biopsy 3) f/u outpatient with plan to repeat imaging.   Roland Rack, MD [facility] [phone]  If 7pm- 7am, please page neurology on call as listed in [place].

## 2021-02-24 NOTE — Progress Notes (Signed)
Discharge instructions discussed with patient while daughter at bedside. Meds sent home with patient. All questions answered. IV removed. Pt wheeled off unit for transport home by daughter.  Gwendolyn Grant, RN

## 2021-03-12 ENCOUNTER — Ambulatory Visit (INDEPENDENT_AMBULATORY_CARE_PROVIDER_SITE_OTHER): Payer: Medicare Other | Admitting: Neurology

## 2021-03-12 ENCOUNTER — Encounter: Payer: Self-pay | Admitting: Neurology

## 2021-03-12 VITALS — BP 110/83 | HR 90 | Ht 66.0 in | Wt 240.0 lb

## 2021-03-12 DIAGNOSIS — R413 Other amnesia: Secondary | ICD-10-CM

## 2021-03-12 DIAGNOSIS — G40901 Epilepsy, unspecified, not intractable, with status epilepticus: Secondary | ICD-10-CM | POA: Diagnosis not present

## 2021-03-12 MED ORDER — LEVETIRACETAM 750 MG PO TABS: ORAL_TABLET | ORAL | 4 refills | Status: DC

## 2021-03-12 NOTE — Progress Notes (Signed)
Chief Complaint  Patient presents with   Seizures    RM 77 with daughter tabitha  Pt is well, had  one episode in Nov but never had a seizure  or episode like that before       ASSESSMENT AND PLAN  Robin Adams is a 70 y.o. 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 Personally reviewed MRI of the brain with contrast February 19, 2021 Evaluation is limited by motion artifact. Within this limitation, there is contrast enhancement in the left anterior temporal lobe and left temporoparietal region, which correlates with areas of restricted diffusion and increased T2 signal seen on the prior MRI. An additional area of possible contrast enhancement in the left pons does not correlate with abnormal signal seen on the prior exam; this may be unrelated or indicate an additional area of abnormality that developed since the prior exam.   This appearance remains concerning for autoimmune or viral encephalitis, including herpes encephalitis. Vasculitis is also in the differential. Seizures or subacute infarcts are felt to be less likely.  CT of chest and abdomen showed no acute intrathoracic abnormality, hepatic steatosis, diverticulitis no definite acute diverticulitis diverticulosis,  Laboratory evaluations BMP low calcium 8.4, UA showed no significant abnormality, normal CBC,  Spinal fluid testing: Clear, WBC 3, RBC 104, protein 38, glucose 52, HSV 1 and 2 DNA negative,  crypto antigen negative, oligoclonal banding 0, thyroperoxidase negative, thyroglobulin antibody negative, A1c 5.8  EEG on Feb 22 2021 This study showed evidence of epileptogenicity and cortical dysfunction arising from left hemisphere, maximal left frontotemporal region likely secondary to underlying structural abnormality. No definite seizures were seen throughout the recording.   ECHO on Nov 10 222: normal 60%, no significant abnormality  MEDICAL HISTORY:  Robin Adams is a 70 year old female, seen in request by Dr. Antonieta Pert, MD to follow-up hospital discharge for partial status epilepticus, primary care physician is Dr. Brita Romp, Dionne Bucy, MD initial evaluation was on March 12, 2021.  I reviewed and summarized the referring note. Hypertension Obesity Smoker  Patient lives with her daughter, who works third shift, she is a retired [employer] manager, at baseline, she enjoys watching TV, plays on her computer, not physically active due to bilateral knee pain  On February 17, 2021, at 3 PM, 1 her daughter woke up from her sleep, tried to text her mother, noticed that patient texted back strangely, when her daughter interacted with her, she was noted to be confused, she came from upstairs to sit down in the chair, then slumped over lost consciousness, was brought by the EMS to the hospital  Patient only has pieces of memory prior to hospital admission, then woke up at the hospital for 5 days later  EEG showed evidence of epileptogenicity and cortical dysfunction arising from the left hemisphere, maximum left frontal temporal region,  Personally reviewed MRI of the brain, restricted diffusion and increased T2 signal, most prominent in bilateral anterior temporal lobes, left greater than right medial temporal lobe, left temporal  parietal region, with contrast-enhancement in the left anterior temporal lobe in the left temporoparietal region,  CSF showed no significant abnormality, WBC 3, normal total  protein 38, glucose 52, HSV PCR was negative  She was treated by neuro hospitalist, no steroid was used, was treated with Keppra 1500 mg twice a day, stopped acyclovir,  Patient is now back home with her daughter, almost back to her baseline, but daughter noticed mild increase of memory loss, today's MoCA examination 25/30, missed to 4 out of 5 recalls, she denies a family history of memory loss  PHYSICAL EXAM:   Vitals:   03/12/21 0824  BP: 110/83  Pulse: 90  Weight: 240 lb (108.9 kg)  Height: 5\' 6"  (1.676 m)   Not recorded     Body mass index is 38.74 kg/m.  PHYSICAL EXAMNIATION:  Gen: NAD, conversant, well nourised, well groomed                     Cardiovascular: Regular rate rhythm, no peripheral edema, warm, nontender. Eyes: Conjunctivae clear without exudates or hemorrhage Neck: Supple, no carotid bruits. Pulmonary: Clear to auscultation bilaterally   NEUROLOGICAL EXAM:  MENTAL STATUS: Speech:    Speech is normal; fluent and spontaneous with normal comprehension.  Cognition:     Montreal Cognitive Assessment  03/12/2021  Visuospatial/ Executive (0/5) 5  Naming (0/3) 3  Attention: Read list of digits (0/2) 2  Attention: Read list of letters (0/1) 1  Attention: Serial 7 subtraction starting at 100 (0/3) 3  Language: Repeat phrase (0/2) 2  Language : Fluency (0/1) 0  Abstraction (0/2) 2  Delayed Recall (0/5) 1  Orientation (0/6) 6  Total 25  Adjusted Score (based on education) 25      CRANIAL NERVES: CN II: Visual fields are full to confrontation. Pupils are round equal and briskly reactive to light. CN III, IV, VI: extraocular movement are normal. No ptosis. CN V: Facial sensation is intact to light touch CN VII: Face is symmetric with normal eye closure  CN VIII: Hearing is normal to causal conversation. CN IX, X: Phonation is normal. CN XI: Head turning and shoulder shrug are intact  MOTOR: There is no pronator drift of out-stretched arms. Muscle  bulk and tone are normal. Muscle strength is normal.  REFLEXES: Reflexes are 2+ and symmetric at the biceps, triceps, knees, and ankles. Plantar responses are flexor.  SENSORY: Intact to light touch, pinprick and vibratory sensation are intact in fingers and toes.  COORDINATION: There is no trunk or limb dysmetria noted.  GAIT/STANCE: Need push-up to get up from seated position, mildly antalgic  REVIEW OF SYSTEMS:  Full 14 system review of systems performed and notable only for as above All other review of systems were negative.   ALLERGIES: Allergies  Allergen Reactions   Tetanus Toxoids Hives    HOME MEDICATIONS: Current Outpatient Medications  Medication Sig Dispense Refill   acetaminophen (TYLENOL) 500 MG tablet Take 1,000 mg by mouth every 6 (six) hours as needed for moderate pain or headache.     amLODipine (NORVASC) 10 MG tablet Take 1 tablet (10 mg total) by mouth daily. 30 tablet 0   ibuprofen (ADVIL) 200 MG tablet Take 400 mg by mouth every 6 (six) hours as needed for headache or moderate pain.     levETIRAcetam (KEPPRA) 750 MG tablet Take 2 tablets (1,500 mg total) by mouth 2 (two) times daily. (Patient taking differently: Take 1,500 mg by mouth 2 (two) times daily.  Pt taking 2 BID.) 120 tablet 0   Multiple Vitamin (MULTI-VITAMIN PO) Take 1 tablet by mouth daily.     No current facility-administered medications for this visit.    PAST MEDICAL HISTORY: Past Medical History:  Diagnosis Date   Hernia, hiatal    Hypertension     PAST SURGICAL HISTORY: Past Surgical History:  Procedure Laterality Date   CESAREAN SECTION     FRACTURE SURGERY Right    IR INJECT/THERA/INC NEEDLE/CATH/PLC EPI/LUMB/SAC W/IMG  02/19/2021   TUBAL LIGATION      FAMILY HISTORY: Family History  Problem Relation Age of Onset   Kyphosis Mother    Alcohol abuse Father    Liver disease Father    Kidney failure Father    Thyroid disease Maternal Grandmother    Breast cancer Paternal  Grandmother    Heart disease Paternal Grandfather    Rheum arthritis Daughter     SOCIAL HISTORY: Social History   Socioeconomic History   Marital status: Legally Separated    Spouse name: Not on file   Number of children: Not on file   Years of education: Not on file   Highest education level: Not on file  Occupational History   Not on file  Tobacco Use   Smoking status: Every Day    Packs/day: 1.00    Years: 50.00    Pack years: 50.00    Types: Cigarettes   Smokeless tobacco: Never  Vaping Use   Vaping Use: Never used  Substance and Sexual Activity   Alcohol use: No   Drug use: No   Sexual activity: Not Currently    Partners: Male    Birth control/protection: Post-menopausal  Other Topics Concern   Not on file  Social History Narrative   Not on file   Social Determinants of Health   Financial Resource Strain: Not on file  Food Insecurity: Not on file  Transportation Needs: Not on file  Physical Activity: Not on file  Stress: Not on file  Social Connections: Not on file  Intimate Partner Violence: Not on file    Total time spent reviewing the chart, obtaining history, examined patient, ordering tests, documentation, consultations and family, care coordination was 43 minutes    Marcial Pacas, M.D. Ph.D.  [facility] [address], [place], [place] [address] Ph: [phone] Fax: [phone]  CC:  Antonieta Pert, MD [place],  [place] [address]  Virginia Crews, MD

## 2021-03-13 ENCOUNTER — Encounter: Payer: Self-pay | Admitting: Neurology

## 2021-03-13 LAB — COMPREHENSIVE METABOLIC PANEL
ALT: 28 IU/L (ref 0–32)
AST: 23 IU/L (ref 0–40)
Albumin/Globulin Ratio: 1.3 (ref 1.2–2.2)
Albumin: 3.9 g/dL (ref 3.8–4.8)
Alkaline Phosphatase: 132 IU/L — ABNORMAL HIGH (ref 44–121)
BUN/Creatinine Ratio: 17 (ref 12–28)
BUN: 16 mg/dL (ref 8–27)
Bilirubin Total: 0.5 mg/dL (ref 0.0–1.2)
CO2: 25 mmol/L (ref 20–29)
Calcium: 9.6 mg/dL (ref 8.7–10.3)
Chloride: 104 mmol/L (ref 96–106)
Creatinine, Ser: 0.96 mg/dL (ref 0.57–1.00)
Globulin, Total: 2.9 g/dL (ref 1.5–4.5)
Glucose: 147 mg/dL — ABNORMAL HIGH (ref 70–99)
Potassium: 4.3 mmol/L (ref 3.5–5.2)
Sodium: 144 mmol/L (ref 134–144)
Total Protein: 6.8 g/dL (ref 6.0–8.5)
eGFR: 64 mL/min/{1.73_m2} (ref >59)

## 2021-03-13 LAB — PTH, INTACT AND CALCIUM: PTH: 17 pg/mL (ref 15–65)

## 2021-03-13 LAB — VITAMIN B12: Vitamin B-12: 425 pg/mL (ref 232–1245)

## 2021-03-14 ENCOUNTER — Encounter: Payer: Self-pay | Admitting: Neurology

## 2021-03-15 ENCOUNTER — Other Ambulatory Visit: Payer: Self-pay | Admitting: *Deleted

## 2021-03-15 ENCOUNTER — Other Ambulatory Visit: Payer: Self-pay

## 2021-03-15 ENCOUNTER — Encounter: Payer: Self-pay | Admitting: Neurology

## 2021-03-15 ENCOUNTER — Ambulatory Visit (INDEPENDENT_AMBULATORY_CARE_PROVIDER_SITE_OTHER): Payer: Medicare Other | Admitting: Neurology

## 2021-03-15 DIAGNOSIS — G40901 Epilepsy, unspecified, not intractable, with status epilepticus: Secondary | ICD-10-CM | POA: Diagnosis not present

## 2021-03-15 MED ORDER — LEVETIRACETAM 750 MG PO TABS: ORAL_TABLET | ORAL | 4 refills | Status: AC

## 2021-03-15 NOTE — Telephone Encounter (Signed)
Please see other mychart message for further information.

## 2021-03-19 ENCOUNTER — Encounter: Payer: Self-pay | Admitting: Adult Health

## 2021-03-22 NOTE — Telephone Encounter (Signed)
I never saw this order it did not drop in my que, I just sent it to [place].

## 2021-03-25 ENCOUNTER — Telehealth: Payer: Self-pay | Admitting: Neurology

## 2021-03-25 ENCOUNTER — Encounter: Payer: Self-pay | Admitting: *Deleted

## 2021-03-25 DIAGNOSIS — G40901 Epilepsy, unspecified, not intractable, with status epilepticus: Secondary | ICD-10-CM

## 2021-03-25 MED ORDER — LAMOTRIGINE 100 MG PO TABS: 150.0000 mg | ORAL_TABLET | Freq: Two times a day (BID) | ORAL | 11 refills | Status: AC

## 2021-03-25 MED ORDER — LAMOTRIGINE 25 MG PO TABS: ORAL_TABLET | ORAL | 0 refills | Status: AC

## 2021-03-25 NOTE — Addendum Note (Signed)
Addended by: Marcial Pacas on: 03/25/2021 12:10 PM   Modules accepted: Orders

## 2021-03-25 NOTE — Telephone Encounter (Signed)
I spoke to the patient's daughter and reviewed the plan below. She verbalized understanding and correctly communicated the dosing schedule. A copy of the chart has also been sent to the active mychart account.

## 2021-03-25 NOTE — Telephone Encounter (Signed)
weeks Keppra 750 mg Lamotrigine 25mg   1st 1/2 1/1  2nd 1/2 2/2  3rd 1/1 3/3  4th 0/1 Lamotrigine 100mg  twice a day  5th 0/0 Lamotrigine 150mg  twice a day

## 2021-03-25 NOTE — Telephone Encounter (Signed)
Pt's daughter states that pt is not herself.  She cant get thru a sentence without crying.  Daughter states pt told her she thinks it is the [place].  Daughter is asking for a call to discuss. Daughter made aware the office is closing at 12 noon today.

## 2021-03-25 NOTE — Telephone Encounter (Signed)
I spoke to the patient's daughter. Reports her mother was started on generic Keppra during her hospitalization last month. The irrational, agitation behavior has escalated since starting this medication. Today, her mother is unable to even make a grocery list without crying.   She would like to discuss an alternate treatment plan.

## 2021-03-26 NOTE — Procedures (Signed)
° °  HISTORY: 70 year old female presented with status epilepticus in November 2022, MRI showed signal abnormality within left anterior temporal and parietal region.  TECHNIQUE:  This is a routine 16 channel EEG recording with one channel devoted to a limited EKG recording.  It was performed during wakefulness, drowsiness and asleep.  Hyperventilation and photic stimulation were performed as activating procedures.  There are minimum muscle and movement artifact noted.  Upon maximum arousal, there are asymmetry of background activity, noticeable left frontotemporal slowing, intermittent mild dysrhythmic theta range activity involving Fp1, F7, T7, T7, 01.  Hyperventilation produced mild/moderate buildup with higher amplitude and the slower activities noted.  Photic stimulation did not alter the tracing.  During EEG recording, patient developed drowsiness and no deeper stage of sleep was achieved.  During EEG recording, there was no epileptiform discharge noted.  EKG demonstrate sinus rhythm, with heart rate of 80 bpm  CONCLUSION: This is an abnormal EEG.  There is evidence of left frontotemporal slowing.  There is no clinical seizure activity noted.  Marcial Pacas, M.D. Ph.D.  [facility] [place], [place] [address] Phone: [phone] Fax:      [phone]

## 2021-04-08 ENCOUNTER — Emergency Department: Payer: Medicare Other

## 2021-04-08 ENCOUNTER — Telehealth: Payer: Self-pay | Admitting: Neurology

## 2021-04-08 ENCOUNTER — Emergency Department
Admission: EM | Admit: 2021-04-08 | Discharge: 2021-04-08 | Disposition: A | Discharge status: Other Acute Inpatient Hospital | Payer: Medicare Other | Attending: Emergency Medicine | Admitting: Emergency Medicine

## 2021-04-08 ENCOUNTER — Encounter: Payer: Self-pay | Admitting: Adult Health

## 2021-04-08 DIAGNOSIS — F17210 Nicotine dependence, cigarettes, uncomplicated: Secondary | ICD-10-CM | POA: Insufficient documentation

## 2021-04-08 DIAGNOSIS — Z79899 Other long term (current) drug therapy: Secondary | ICD-10-CM | POA: Insufficient documentation

## 2021-04-08 DIAGNOSIS — U071 COVID-19: Secondary | ICD-10-CM | POA: Diagnosis not present

## 2021-04-08 DIAGNOSIS — D332 Benign neoplasm of brain, unspecified: Secondary | ICD-10-CM | POA: Diagnosis not present

## 2021-04-08 DIAGNOSIS — R4182 Altered mental status, unspecified: Secondary | ICD-10-CM | POA: Diagnosis present

## 2021-04-08 DIAGNOSIS — G9389 Other specified disorders of brain: Secondary | ICD-10-CM

## 2021-04-08 DIAGNOSIS — I10 Essential (primary) hypertension: Secondary | ICD-10-CM | POA: Diagnosis not present

## 2021-04-08 DIAGNOSIS — R531 Weakness: Secondary | ICD-10-CM

## 2021-04-08 LAB — CBC WITH DIFFERENTIAL/PLATELET
Abs Immature Granulocytes: 0.01 10*3/uL (ref 0.00–0.07)
Basophils Absolute: 0.1 10*3/uL (ref 0.0–0.1)
Basophils Relative: 1 %
Eosinophils Absolute: 0.1 10*3/uL (ref 0.0–0.5)
Eosinophils Relative: 2 %
HCT: 46.3 % — ABNORMAL HIGH (ref 36.0–46.0)
Hemoglobin: 15 g/dL (ref 12.0–15.0)
Immature Granulocytes: 0 %
Lymphocytes Relative: 19 %
Lymphs Abs: 1 10*3/uL (ref 0.7–4.0)
MCH: 28 pg (ref 26.0–34.0)
MCHC: 32.4 g/dL (ref 30.0–36.0)
MCV: 86.4 fL (ref 80.0–100.0)
Monocytes Absolute: 0.4 10*3/uL (ref 0.1–1.0)
Monocytes Relative: 7 %
Neutro Abs: 3.7 10*3/uL (ref 1.7–7.7)
Neutrophils Relative %: 71 %
Platelets: 143 10*3/uL — ABNORMAL LOW (ref 150–400)
RBC: 5.36 MIL/uL — ABNORMAL HIGH (ref 3.87–5.11)
RDW: 15.2 % (ref 11.5–15.5)
WBC: 5.2 10*3/uL (ref 4.0–10.5)
nRBC: 0 % (ref 0.0–0.2)

## 2021-04-08 LAB — URINALYSIS, ROUTINE W REFLEX MICROSCOPIC
Bilirubin Urine: NEGATIVE
Glucose, UA: NEGATIVE mg/dL
Ketones, ur: 5 mg/dL — AB
Leukocytes,Ua: NEGATIVE
Nitrite: POSITIVE — AB
Protein, ur: NEGATIVE mg/dL
Specific Gravity, Urine: 1.009 (ref 1.005–1.030)
pH: 7 (ref 5.0–8.0)

## 2021-04-08 LAB — COMPREHENSIVE METABOLIC PANEL
ALT: 37 U/L (ref 0–44)
AST: 34 U/L (ref 15–41)
Albumin: 3.7 g/dL (ref 3.5–5.0)
Alkaline Phosphatase: 89 U/L (ref 38–126)
Anion gap: 7 (ref 5–15)
BUN: 8 mg/dL (ref 8–23)
CO2: 26 mmol/L (ref 22–32)
Calcium: 9 mg/dL (ref 8.9–10.3)
Chloride: 105 mmol/L (ref 98–111)
Creatinine, Ser: 0.84 mg/dL (ref 0.44–1.00)
GFR, Estimated: >60 mL/min (ref >60)
Glucose, Bld: 137 mg/dL — ABNORMAL HIGH (ref 70–99)
Potassium: 3.6 mmol/L (ref 3.5–5.1)
Sodium: 138 mmol/L (ref 135–145)
Total Bilirubin: 0.9 mg/dL (ref 0.3–1.2)
Total Protein: 7 g/dL (ref 6.5–8.1)

## 2021-04-08 LAB — RESP PANEL BY RT-PCR (FLU A&B, COVID) ARPGX2
Influenza A by PCR: NEGATIVE
Influenza B by PCR: NEGATIVE
SARS Coronavirus 2 by RT PCR: POSITIVE — AB

## 2021-04-08 LAB — TROPONIN I (HIGH SENSITIVITY): Troponin I (High Sensitivity): 4 ng/L (ref <18)

## 2021-04-08 IMAGING — CT CT HEAD W/O CM
4 series · 16 of 47 positions shown, 18 images · non-contrast
Comparison: [DATE] MRI, correlation is also made with
[DATE] CT head

CLINICAL DATA: Mental status change

EXAM:
CT HEAD WITHOUT CONTRAST
TECHNIQUE: Contiguous axial images were obtained from the base of the skull
through the vertex without intravenous contrast.

[Series 2: head bone · axial · 0.44mm/px · z∈[-122,-90]mm · 3 of 82 slices shown]
[im 9/82  bone]
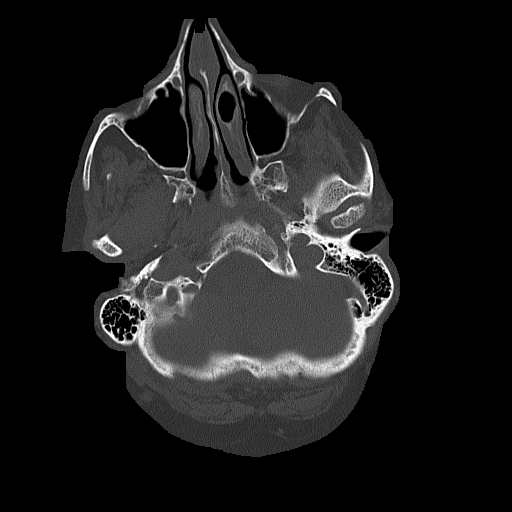
[im 17/82  bone]
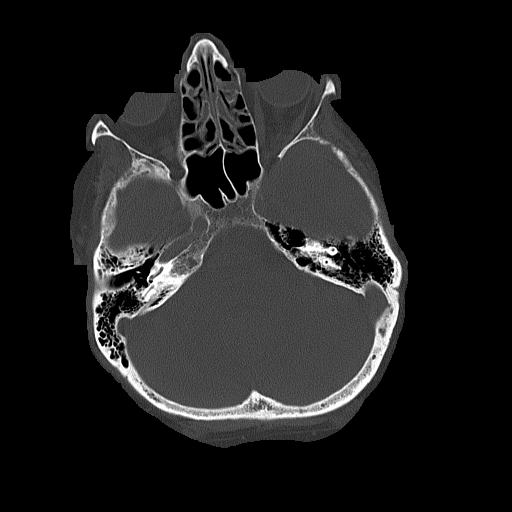
[im 25/82  bone]
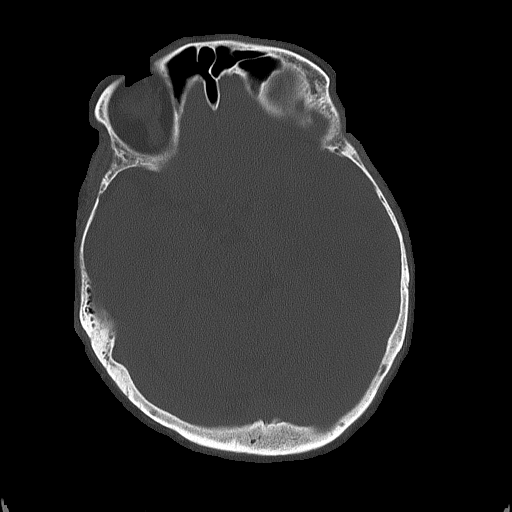

[Series 3: head wo · axial · 0.44mm/px · z∈[-118,+2]mm · 7 of 33 slices shown, 9 images]
[im 5/33  brain]
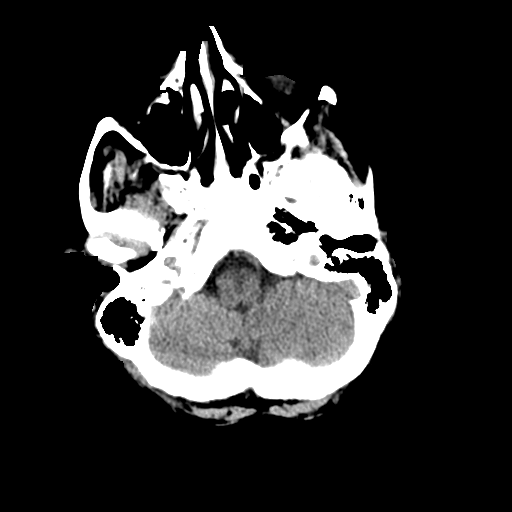
[im 5/33  bone]
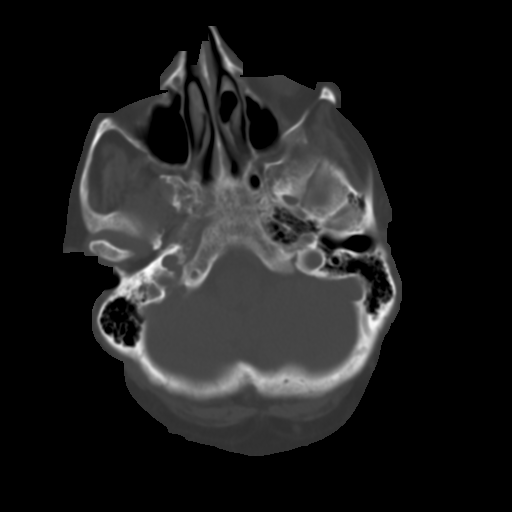
[im 9/33  brain]
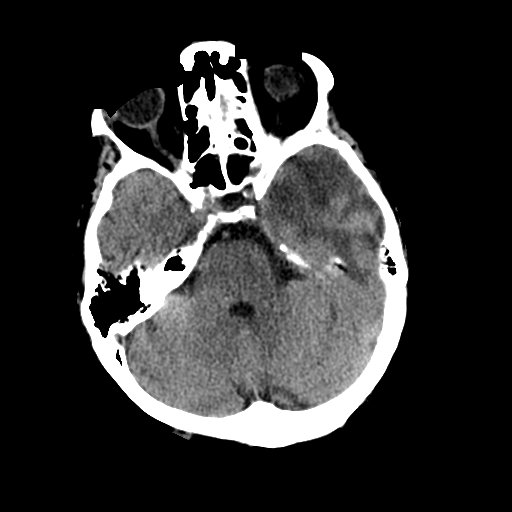
[im 13/33  brain]
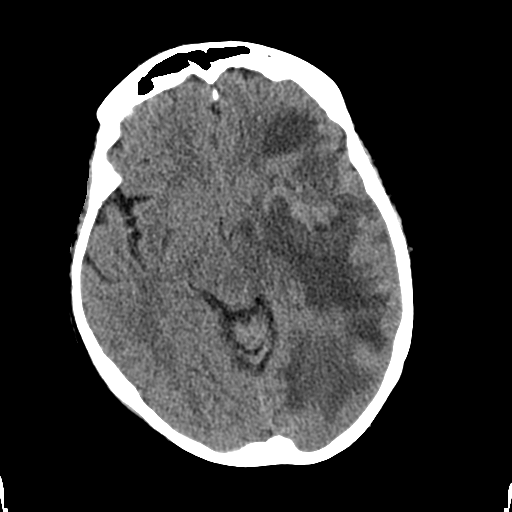
[im 17/33  brain]
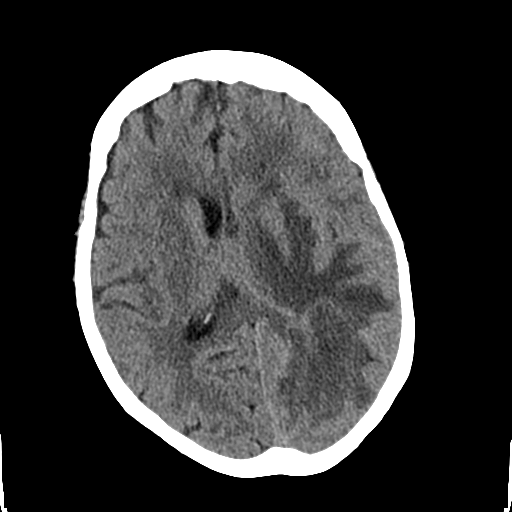
[im 21/33  brain]
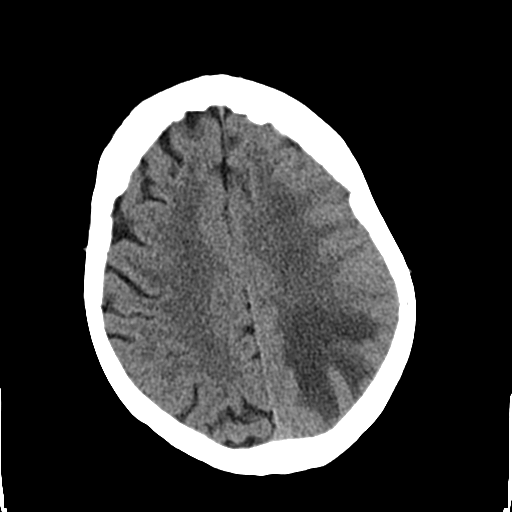
[im 21/33  bone]
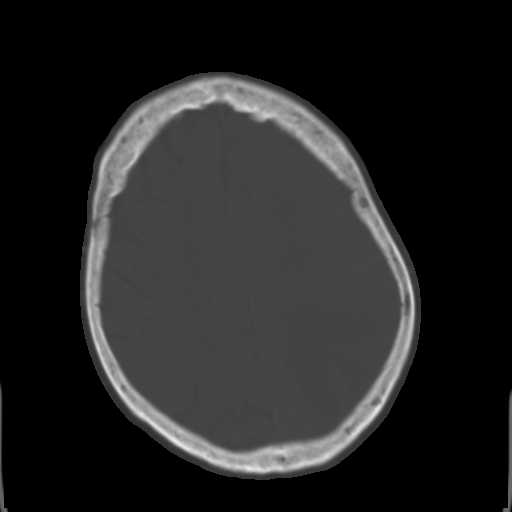
[im 25/33  brain]
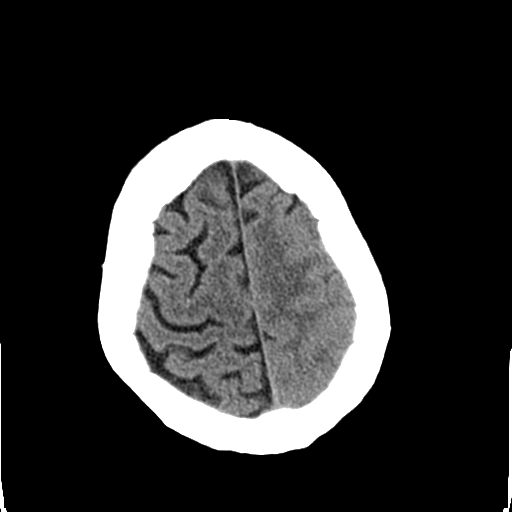
[im 29/33  brain]
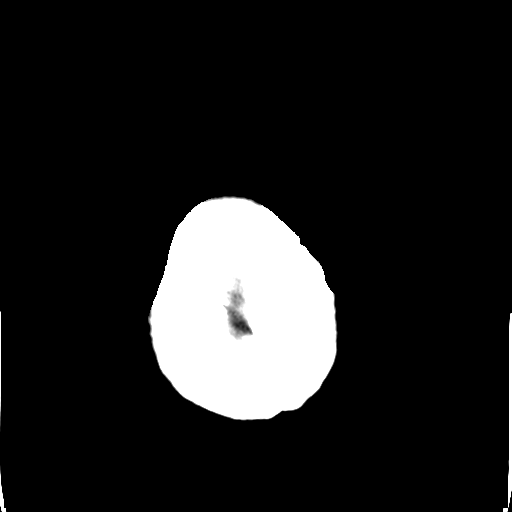

[Series 4: coronal soft tissue · coronal · 0.33mm/px · 3 of 70 slices shown]
[im 27/70  brain]
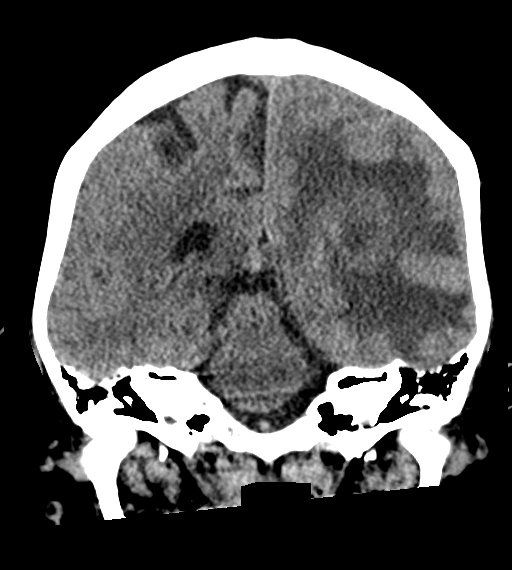
[im 32/70  brain]
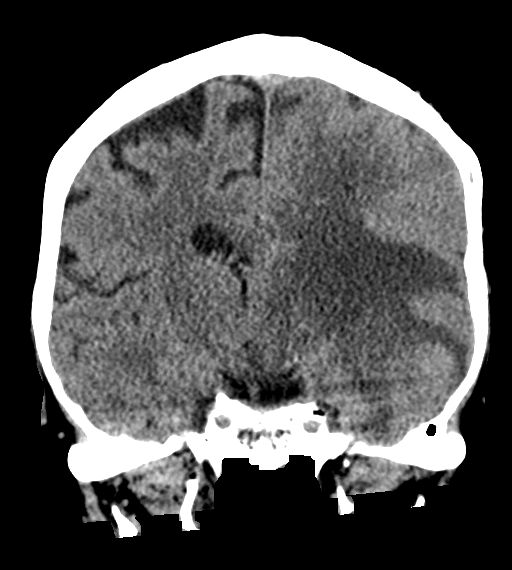
[im 38/70  brain]
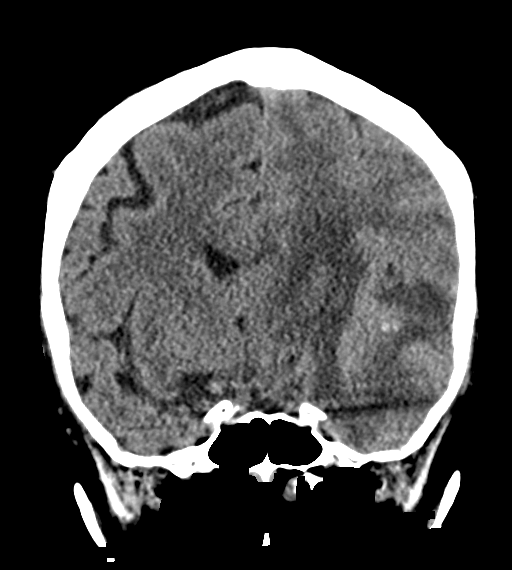

[Series 5: sagittal soft tissue · sagittal · 0.36mm/px · 3 of 56 slices shown]
[im 19/56  brain]
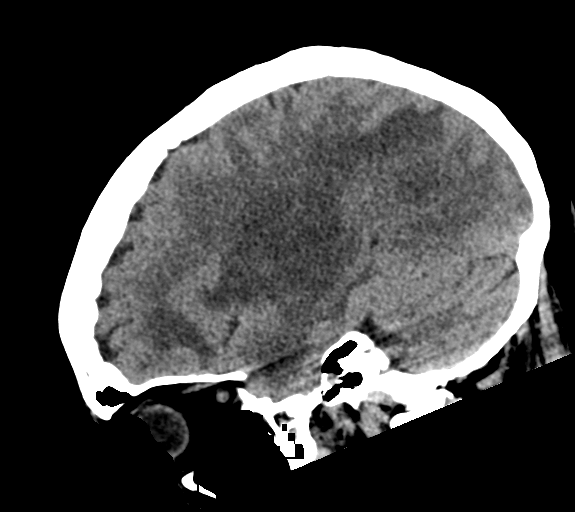
[im 28/56  brain]
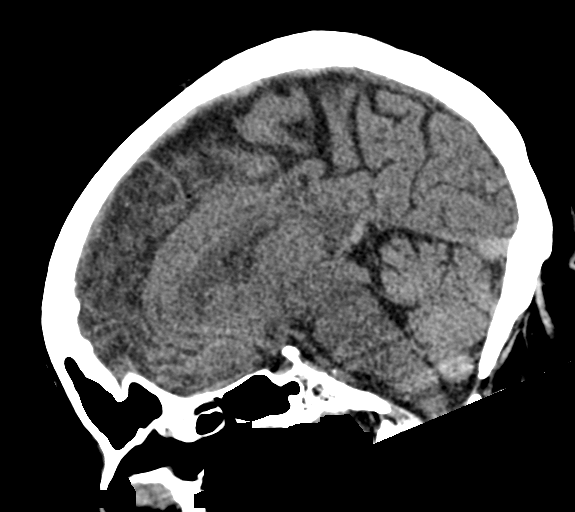
[im 37/56  brain]
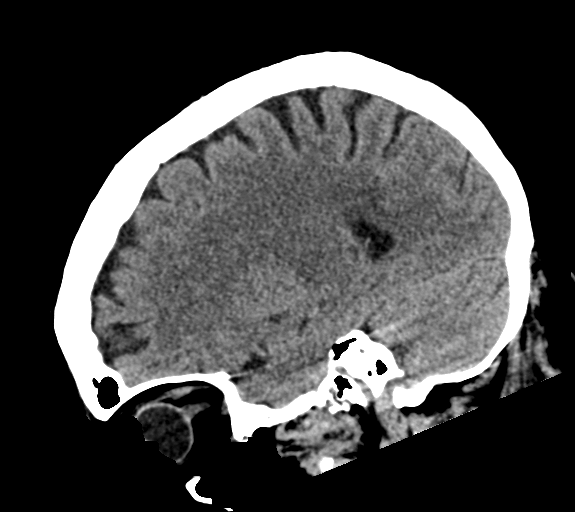

[16 of 47 positions shown; findings below may reference images not displayed]

FINDINGS: Brain: Masslike area in the left temporoparietal region, measuring
approximately 4.8 x 2.2 cm, with surrounding hypodensity, likely
edema. Additional masslike area in the left anterior temporal lobe,
poorly defined but possibly measuring approximately 3.7 x 2.6 cm,
with surrounding hypodensity, likely edema. These are new from the
prior CT.

There is mass effect on the left lateral and third ventricles
secondary to the edema, with approximately 7 mm of left-to-right
midline shift. No hydrocephalus. No extra-axial collection

The basal cisterns are preserved. Possible hyperdensity in the left
temporal lobe (series 3, image 12 and 14), some of which may
represent calcifications and some which may represent hemorrhage.
Cystic lesion in the left uncus (series 3, image 12), possibly
trapped fluid secondary to edema.

Vascular: No hyperdense vessel

Skull: Normal. Negative for fracture or focal lesion.

Sinuses/Orbits: Mucosal thickening in the ethmoid air cells.
Otherwise negative.

Other: The mastoids are well aerated.
IMPRESSION: 1. Masslike areas in the left temporal and temporoparietal regions,
with surrounding edema, concerning for primary versus metastatic CNS
neoplasm, although infection could appear similar. This causes mass
effect, with effacement of the left lateral and third ventricles but
without definite hydrocephalus, as well as 7 mm of left-to-right
midline shift. MRI with and without contrast is recommended.

2. Mild hyperdensity in the left temporal lobe, which may represent
hemorrhage and/or calcifications.

These results were called by telephone at the time of interpretation
on [DATE] at [DATE] to provider YOSHICO , who verbally
acknowledged these results.

## 2021-04-08 IMAGING — DX DG CHEST 1V PORT
1 series · 1 of 1 positions shown · non-contrast
Comparison: [DATE]

CLINICAL DATA: Generalized weakness

EXAM:
PORTABLE CHEST 1 VIEW

[chest ap]
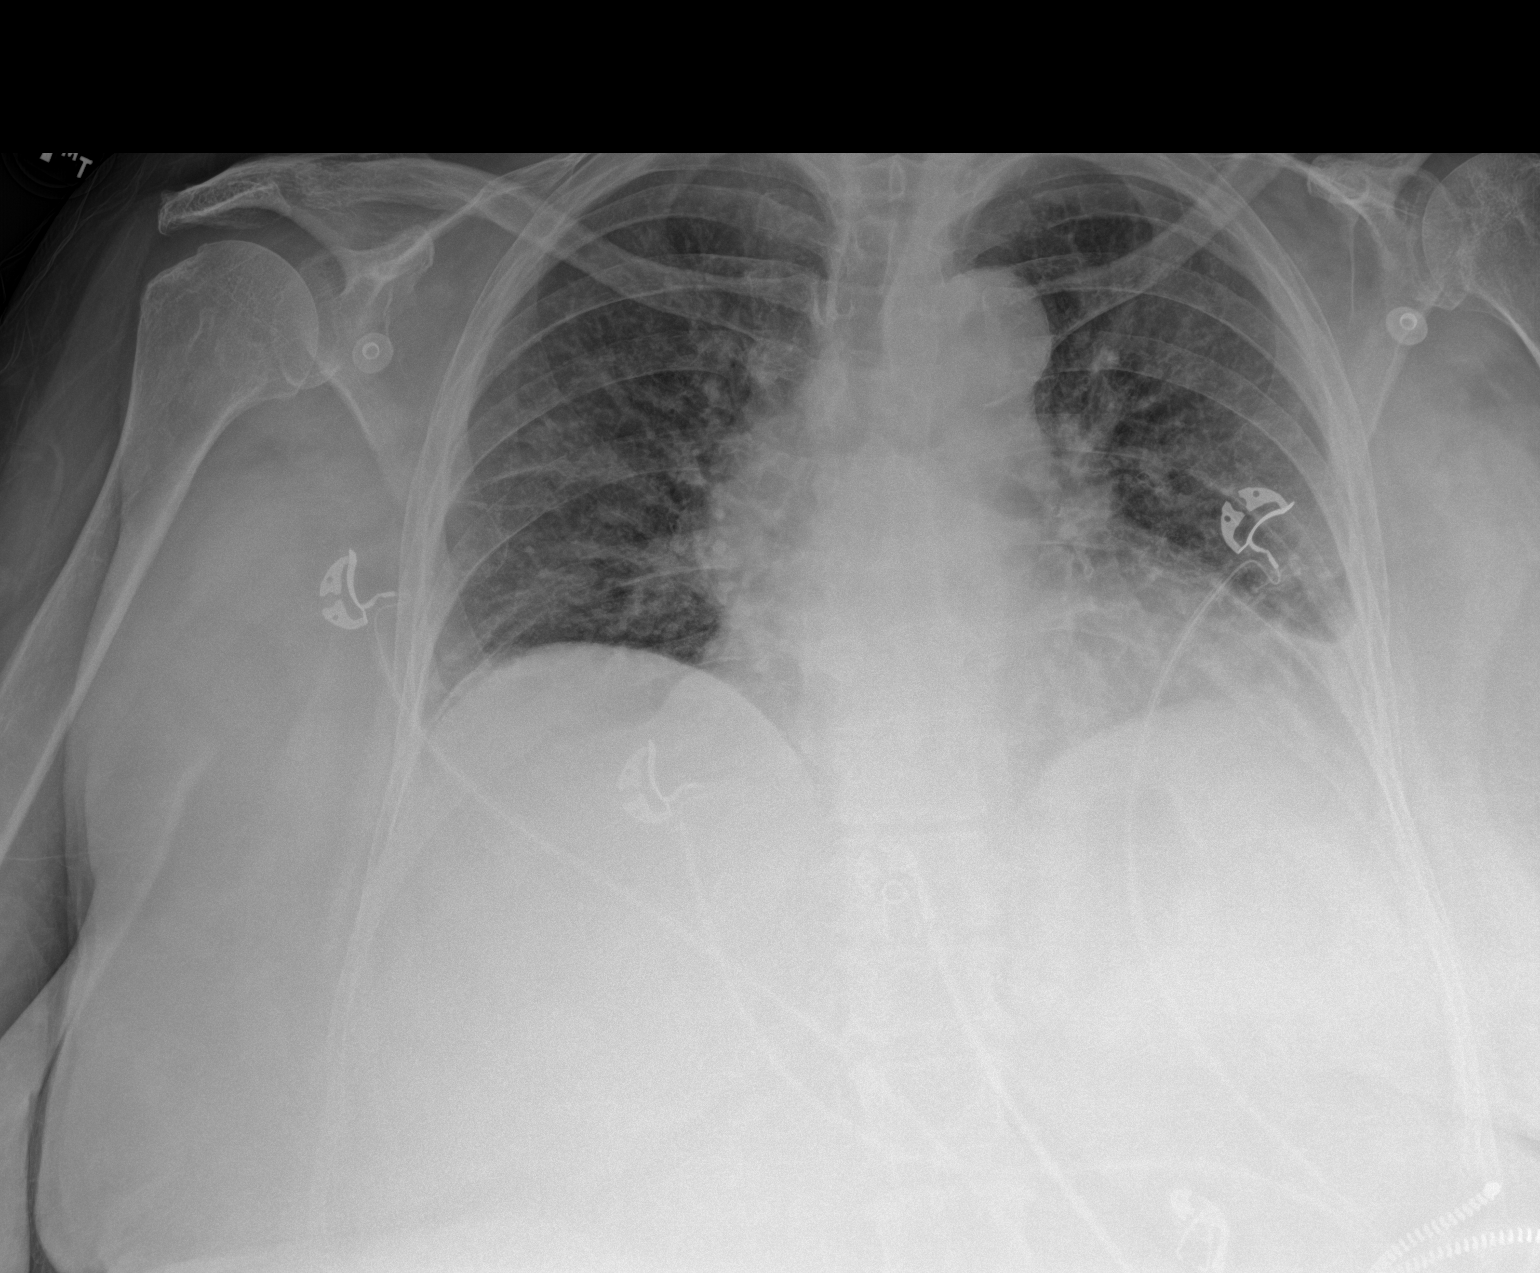

[1 of 1 positions shown; findings below may reference images not displayed]

FINDINGS: Transverse diameter of heart is increased in size. Central pulmonary
vessels are prominent. There is poor inspiration. Interstitial
markings in the parahilar regions and lower lung fields are
prominent. Left lateral CP angle is indistinct. There is no
pneumothorax.
IMPRESSION: Cardiomegaly. Central pulmonary vessels are prominent. There is
prominence of interstitial markings in the parahilar regions and
lower lung fields which may be due to poor inspiration or suggest
interstitial edema or interstitial pneumonitis. Left lateral CP
angle is indistinct suggesting small effusion or pleural thickening.

## 2021-04-08 IMAGING — MR MR HEAD WO/W CM
15 series · 48 of 48 positions shown · IV contrast (10ml Gadavist)
Comparison: Brain MRI [DATE]

CLINICAL DATA: Brain metastases suspected.

EXAM:
MRI HEAD WITHOUT AND WITH CONTRAST
TECHNIQUE: Multiplanar, multiecho pulse sequences of the brain and surrounding
structures were obtained without and with intravenous contrast.
CONTRAST:  1 mL GADAVIST GADOBUTROL 1 MMOL/ML IV SOLN

[Series 5: ax dwi_tracew · axial · 3.0mm · 1.80mm/px · z∈[-120,+34]mm · 2 of 48 slices shown]
[im 1/48]
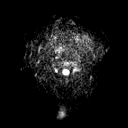
[im 48/48]
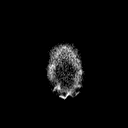

[Series 6: ax dwi_adc · axial · 3.0mm · 1.80mm/px · z∈[-120,+34]mm · 3 of 48 slices shown]
[im 1/48]
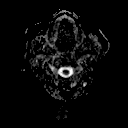
[im 24/48]
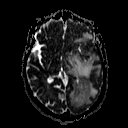
[im 48/48]
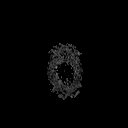

[Series 7: cor dwi_tracew · coronal · 5.0mm · 1.80mm/px · 2 of 38 slices shown]
[im 1/38]
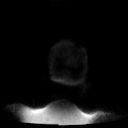
[im 38/38]
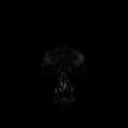

[Series 8: cor dwi_adc · coronal · 5.0mm · 1.80mm/px · 2 of 38 slices shown]
[im 1/38]
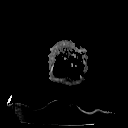
[im 38/38]
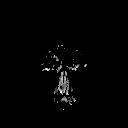

[Series 9: T1 · sagittal · 5.0mm · 0.62mm/px · 1 of 23 slices shown (1 of 4)]
[im 1/23]
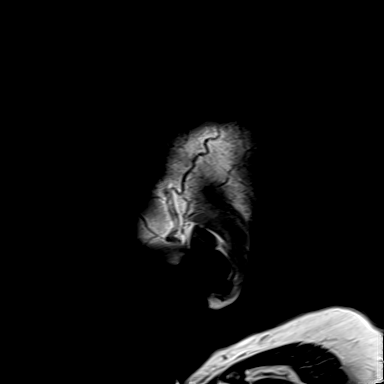

[Series 10: T2 · axial · 5.0mm · 0.86mm/px · 1 of 27 slices shown (1 of 2)]
[im 1/27]
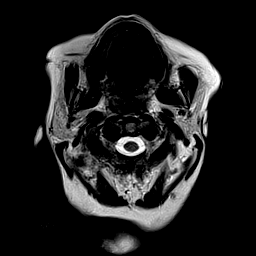

[Series 11: mag_images · axial · 3.0mm · 0.90mm/px · z∈[-118,+35]mm · 3 of 52 slices shown]
[im 1/52]
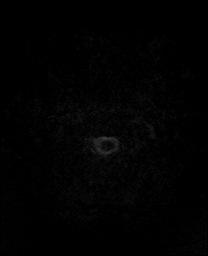
[im 26/52]
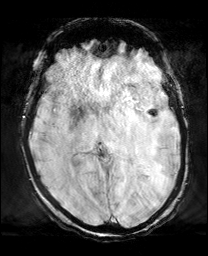
[im 52/52]
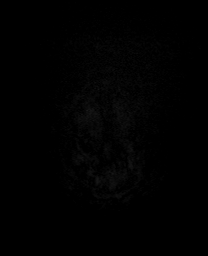

[Series 12: pha_images · axial · 3.0mm · 0.90mm/px · z∈[-118,+35]mm · 3 of 52 slices shown]
[im 1/52]
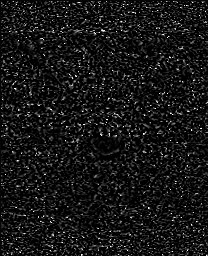
[im 26/52]
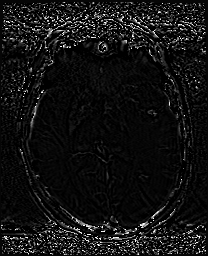
[im 52/52]
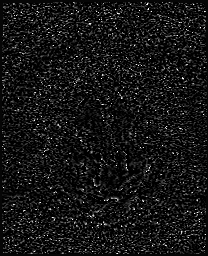

[Series 13: swi_images · axial · 3.0mm · 0.90mm/px · z∈[-118,+35]mm · 3 of 52 slices shown]
[im 1/52]
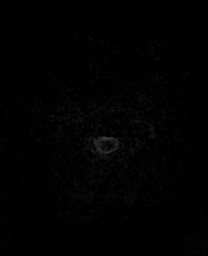
[im 26/52]
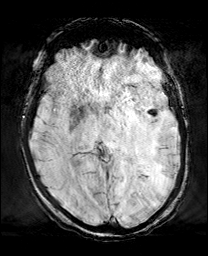
[im 52/52]
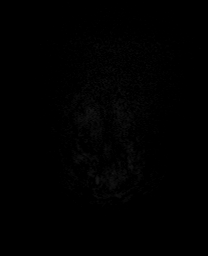

[Series 15: FLAIR · axial · 3.0mm · 0.69mm/px · z∈[-123,+38]mm · 3 of 55 slices shown]
[im 1/55]
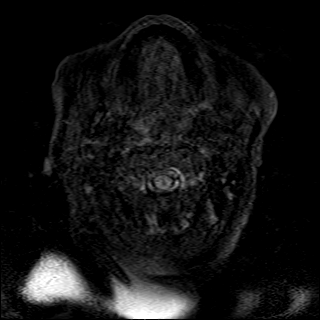
[im 28/55]
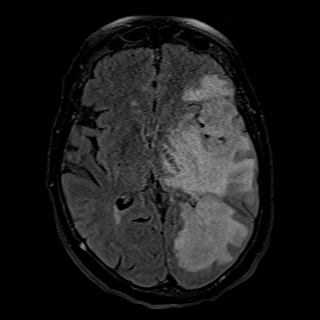
[im 55/55]
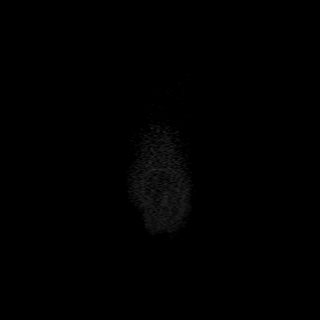

[Series 16: T1 · axial · 1.0mm · 0.98mm/px · z∈[-131,+43]mm · 10 of 176 slices shown (2 of 4)]
[im 1/176]
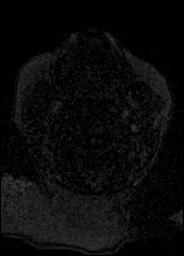
[im 20/176]
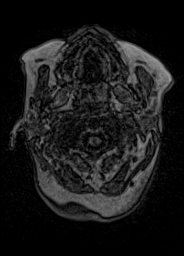
[im 39/176]
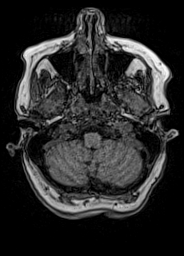
[im 59/176]
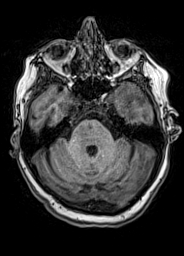
[im 78/176]
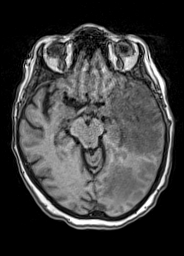
[im 98/176]
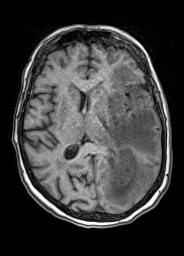
[im 117/176]
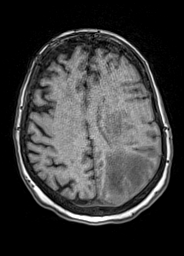
[im 137/176]
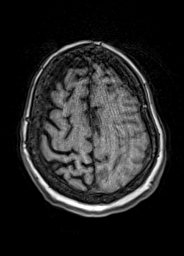
[im 156/176]
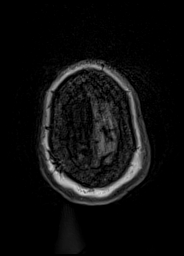
[im 176/176]
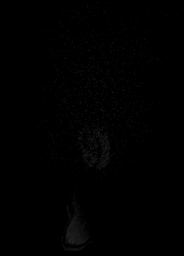

[Series 17: T2 · coronal · 5.0mm · 0.45mm/px · 2 of 31 slices shown (2 of 2)]
[im 1/31]
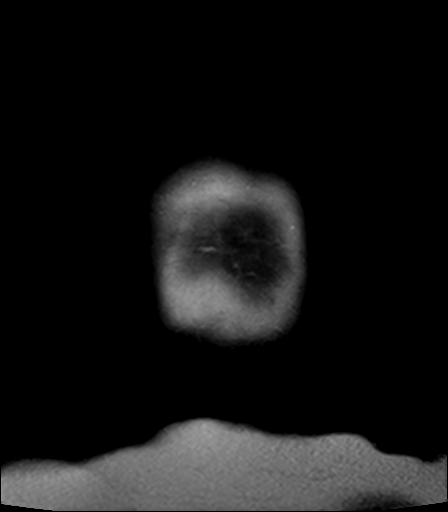
[im 31/31]
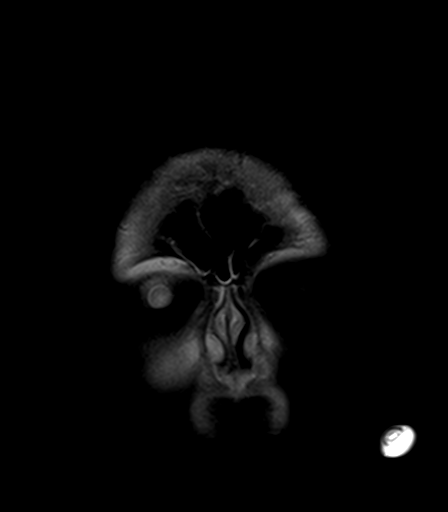

[Series 18: T1 post-contrast · coronal · 5.0mm · 0.90mm/px · 2 of 31 slices shown]
[im 1/31]
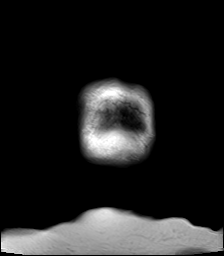
[im 31/31]
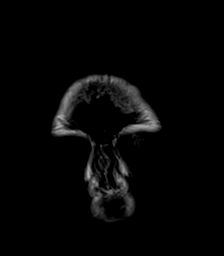

[Series 19: T1 · sagittal · 5.0mm · 0.62mm/px · 1 of 23 slices shown (3 of 4)]
[im 1/23]
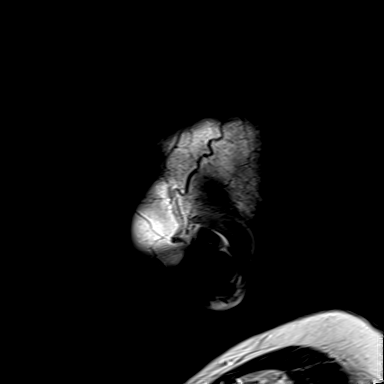

[Series 20: T1 · axial · 1.0mm · 0.98mm/px · z∈[-131,+43]mm · 10 of 176 slices shown (4 of 4)]
[im 1/176]
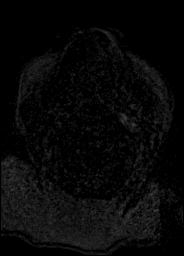
[im 20/176]
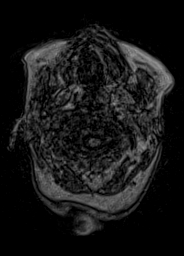
[im 39/176]
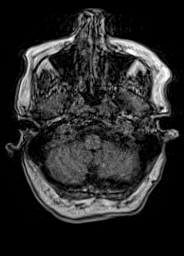
[im 59/176]
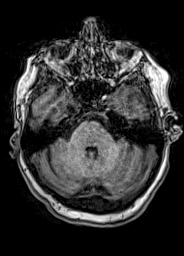
[im 78/176]
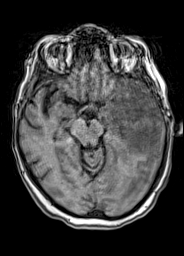
[im 98/176]
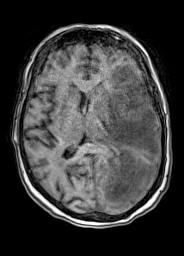
[im 117/176]
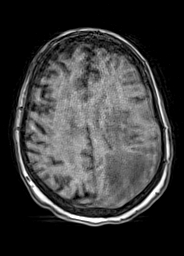
[im 137/176]
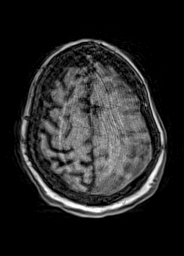
[im 156/176]
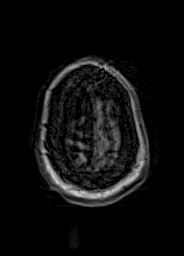
[im 176/176]
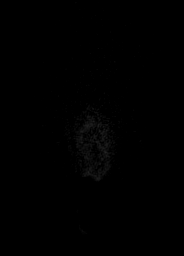

[48 of 48 positions shown; findings below may reference images not displayed]

FINDINGS: The patient was unable to cooperate due to altered mental status.
Only 1 mL of contrast agent with successfully injected. Thus, this
is essentially a noncontrast study.

Brain: There is severe vasogenic edema throughout most of the left
hemisphere, greatest in the parietal and temporal lobes. There is
some mild diffusion abnormality in the anterior left temporal lobe
and in the periatrial temporal lobe. There is 8 mm of rightward
midline shift. There is multifocal periventricular white matter
hyperintensity, most often a result of chronic microvascular
ischemia.

Vascular: Normal flow voids.

Skull and upper cervical spine: Normal marrow signal.

Sinuses/Orbits: Negative.

Other: None.
IMPRESSION: 1. Severe vasogenic edema throughout most of the left hemisphere,
greatest in the parietal and temporal lobes. 8 mm of rightward
midline shift.
2. Limited contrast infusion makes it impossible to measure discrete
lesions. Lesions are suspected in the anterior left temporal lobe
and periatrial region of the posterior left temporal lobe.

## 2021-04-08 MED ORDER — SODIUM CHLORIDE 0.9 % IV SOLN
3000.0000 mg | Freq: Once | INTRAVENOUS | Status: DC
  Filled 2021-04-08: qty 30

## 2021-04-08 MED ORDER — LORAZEPAM 2 MG/ML IJ SOLN: 1.0000 mg | Freq: Once | INTRAMUSCULAR | Status: DC

## 2021-04-08 MED ORDER — GADOBUTROL 1 MMOL/ML IV SOLN
10.0000 mL | Freq: Once | INTRAVENOUS | Status: AC | PRN
  Administered 2021-04-08: 21:00:00 1 mL via INTRAVENOUS

## 2021-04-08 MED ORDER — DEXAMETHASONE SODIUM PHOSPHATE 10 MG/ML IJ SOLN
10.0000 mg | Freq: Once | INTRAMUSCULAR | Status: AC
  Administered 2021-04-08: 19:00:00 10 mg via INTRAVENOUS
  Filled 2021-04-08: qty 1

## 2021-04-08 MED ORDER — LORAZEPAM 2 MG/ML IJ SOLN
1.0000 mg | INTRAMUSCULAR | Status: DC | PRN
  Administered 2021-04-08: 20:00:00 1 mg via INTRAVENOUS
  Filled 2021-04-08: qty 1

## 2021-04-08 MED ORDER — SODIUM CHLORIDE 0.9 % IV SOLN
1500.0000 mg | Freq: Once | INTRAVENOUS | Status: AC
  Administered 2021-04-08: 18:00:00 1500 mg via INTRAVENOUS
  Filled 2021-04-08: qty 30

## 2021-04-08 MED ORDER — LACTATED RINGERS IV BOLUS
1000.0000 mL | Freq: Once | INTRAVENOUS | Status: AC
  Administered 2021-04-08: 17:00:00 1000 mL via INTRAVENOUS

## 2021-04-08 NOTE — ED Notes (Signed)
Images POWERSHARED to [facility]

## 2021-04-08 NOTE — ED Notes (Signed)
Pt awake. Disorientated. Nonverbal. Does not answer questions. Questionable following commands. Did squeeze on the L hand. Would not wiggle toes. Withdrawls to pain. Does not appear to be in pain. Saline locked. RA. Sinus Rhythm per cardiac monitor. Hypertensive. Report and cares given to [facility] transport team.

## 2021-04-08 NOTE — Telephone Encounter (Signed)
I called the patient's daughter. Telephone note in chart.

## 2021-04-08 NOTE — ED Provider Notes (Addendum)
[facility] Emergency Department Provider Note   ____________________________________________   Event Date/Time   First MD Initiated Contact with Patient 04/08/21 1548     (approximate)  I have reviewed the triage vital signs and the nursing notes.   HISTORY  Chief Complaint Altered Mental Status    HPI Robin Adams is a 70 y.o. female with past medical history of hypertension and seizures who presents to the ED for altered mental status.  History is limited due to patient's confusion and inability to answer questions.  Per EMS, patient has had a gradual decline since November, when she was seen in the hospital for altered mental status and diagnosed with seizures.  She was started on Keppra at that time, eventually transitioned to Lamictal due to concern for mood changes.  Daughter had reported to EMS that patient had significantly declined since yesterday evening, is now no longer talking and unable to follow commands.  EMS reports that patient alert on their arrival, withdrawing from pain in all 4 extremities.  Blood glucose noted to be 170 with EMS.        Past Medical History:  Diagnosis Date   Hernia, hiatal    Hypertension     Patient Active Problem List   Diagnosis Date Noted   Memory loss 03/12/2021   Status epilepticus ([place]) 02/24/2021   Hyperglycemia 03/54/6568   Acute metabolic encephalopathy 12/75/1700   Tobacco abuse 02/17/2021   Thyroid nodule 02/17/2021   Essential hypertension 02/17/2021   Obesity, Class III, BMI 40-49.9 (morbid obesity) ([place]) 02/17/2021   Cardiomegaly 02/17/2021   Prolonged QT interval 02/17/2021   Ovarian cyst 10/17/2018   Thickened endometrium 10/17/2018    Past Surgical History:  Procedure Laterality Date   CESAREAN SECTION     FRACTURE SURGERY Right    IR INJECT/THERA/INC NEEDLE/CATH/PLC EPI/LUMB/SAC W/IMG  02/19/2021   TUBAL LIGATION      Prior to Admission medications   Medication Sig Start Date  End Date Taking? Authorizing Provider  acetaminophen (TYLENOL) 500 MG tablet Take 1,000 mg by mouth every 6 (six) hours as needed for moderate pain or headache.    [provider]  amLODipine (NORVASC) 10 MG tablet Take 1 tablet (10 mg total) by mouth daily. 02/24/21 03/26/21  Antonieta Pert, MD  ibuprofen (ADVIL) 200 MG tablet Take 400 mg by mouth every 6 (six) hours as needed for headache or moderate pain.    [provider]  lamoTRIgine (LAMICTAL) 100 MG tablet Take 1.5 tablets (150 mg total) by mouth 2 (two) times daily. 03/25/21   Marcial Pacas, MD  lamoTRIgine (LAMICTAL) 25 MG tablet 1 tab bid x one week 2 tab bid x 2nd week 3 tab bid x 3rd week 03/25/21   Marcial Pacas, MD  levETIRAcetam (KEPPRA) 750 MG tablet One in am, 2 tabs every night 03/15/21   Marcial Pacas, MD  Multiple Vitamin (MULTI-VITAMIN PO) Take 1 tablet by mouth daily.    [provider]    Allergies Tetanus toxoids  Family History  Problem Relation Age of Onset   Kyphosis Mother    Alcohol abuse Father    Liver disease Father    Kidney failure Father    Thyroid disease Maternal Grandmother    Breast cancer Paternal Grandmother    Heart disease Paternal Grandfather    Rheum arthritis Daughter     Social History Social History   Tobacco Use   Smoking status: Every Day    Packs/day: 1.00  Years: 50.00    Pack years: 50.00    Types: Cigarettes   Smokeless tobacco: Never  Vaping Use   Vaping Use: Never used  Substance Use Topics   Alcohol use: No   Drug use: No    Review of Systems Unable to obtain secondary to altered mental status  ____________________________________________   PHYSICAL EXAM:  VITAL SIGNS: ED Triage Vitals  Enc Vitals Group     BP      Pulse      Resp      Temp      Temp src      SpO2      Weight      Height      Head Circumference      Peak Flow      Pain Score      Pain Loc      Pain Edu?      Excl. in [place]?     Constitutional: Awake and alert,  disoriented. Eyes: Conjunctivae are normal. Head: Atraumatic. Nose: No congestion/rhinnorhea. Mouth/Throat: Mucous membranes are moist. Neck: Normal ROM Cardiovascular: Normal rate, regular rhythm. Grossly normal heart sounds.  2+ radial and DP pulses bilaterally. Respiratory: Normal respiratory effort.  No retractions. Lungs CTAB. Gastrointestinal: Soft and nontender. No distention. Genitourinary: deferred Musculoskeletal: No lower extremity tenderness nor edema. Neurologic: Responds verbally only to pain, unable to answer questions or follow commands. No gross focal neurologic deficits are appreciated, withdraws from pain in all 4 extremities and spontaneous movements noted in all 4 extremities. Skin:  Skin is warm, dry and intact. No rash noted. Psychiatric: Unable to assess.  ____________________________________________   LABS (all labs ordered are listed, but only abnormal results are displayed)  Labs Reviewed  RESP PANEL BY RT-PCR (FLU A&B, COVID) ARPGX2 - Abnormal; Notable for the following components:      Result Value   SARS Coronavirus 2 by RT PCR POSITIVE (*)    All other components within normal limits  CBC WITH DIFFERENTIAL/PLATELET - Abnormal; Notable for the following components:   RBC 5.36 (*)    HCT 46.3 (*)    Platelets 143 (*)    All other components within normal limits  COMPREHENSIVE METABOLIC PANEL - Abnormal; Notable for the following components:   Glucose, Bld 137 (*)    All other components within normal limits  URINALYSIS, ROUTINE W REFLEX MICROSCOPIC - Abnormal; Notable for the following components:   Color, Urine YELLOW (*)    APPearance CLEAR (*)    Hgb urine dipstick SMALL (*)    Ketones, ur 5 (*)    Nitrite POSITIVE (*)    Bacteria, UA RARE (*)    All other components within normal limits  TROPONIN I (HIGH SENSITIVITY)   ____________________________________________  EKG  ED ECG REPORT I, Blake Divine, the attending physician, personally  viewed and interpreted this ECG.   Date: 04/08/2021  EKG Time: 15:54  Rate: 71  Rhythm: normal sinus rhythm  Axis: Normal  Intervals:none  ST&T Change: None   PROCEDURES  Procedure(s) performed (including Critical Care):  .Critical Care Performed by: Blake Divine, MD Authorized by: Blake Divine, MD   Critical care provider statement:    Critical care time (minutes):  45   Critical care time was exclusive of:  Separately billable procedures and treating other patients and teaching time   Critical care was necessary to treat or prevent imminent or life-threatening deterioration of the following conditions:  CNS failure or compromise   Critical care  was time spent personally by me on the following activities:  Development of treatment plan with patient or surrogate, discussions with consultants, evaluation of patient's response to treatment, examination of patient, ordering and review of laboratory studies, ordering and review of radiographic studies, ordering and performing treatments and interventions, pulse oximetry, re-evaluation of patient's condition and review of old charts   I assumed direction of critical care for this patient from another provider in my specialty: no     Care discussed with: accepting provider at another facility     ____________________________________________   INITIAL IMPRESSION / ASSESSMENT AND PLAN / ED COURSE      70 year old female with past medical history of hypertension and seizures who presents to the ED for altered mental status, reported to have gradual decline since November that acutely worsened last night with patient unable to respond verbally or follow commands.  On arrival, patient is awake and alert, but unable to answer questions.  She does have spontaneous movements and withdraws from pain in all 4 extremities, suspicion for stroke is low and last known well time is greater than 24 hours.  During similar previous presentation,  patient noted to have EEG concerning for seizure.  Presentation today was discussed with Dr. Rory Percy of neurology, who recommends loading with 3 g of Keppra and admission for EEG and MRI.  We will check CT head, labs, and UA.  EKG shows no evidence of arrhythmia or ischemia.  Patient's family states that she became more agitated while taking Keppra in the past, options discussed with Dr. Rory Percy and we will proceed with loading dose of fosphenytoin.  CT head is significant for masslike structure in the area of left temporal lobe with significant mass-effect and 7 mm of midline shift.  Case discussed with Dr. Cari Caraway of neurosurgery, who recommends transfer to [facility] and will discuss with their neurosurgical team there.  Suspicion for abscess is low and Dr. Cari Caraway recommends proceeding with IV Decadron.  Patient remains awake and alert but nonverbal.  Case discussed with Dr. Colin Mulders of neurosurgery at [facility], who accepts patient for transfer.        ____________________________________________   FINAL CLINICAL IMPRESSION(S) / ED DIAGNOSES  Final diagnoses:  Altered mental status, unspecified altered mental status type  Brain mass     ED Discharge Orders     None        Note:  This document was prepared using Dragon voice recognition software and may include unintentional dictation errors.    Blake Divine, MD 04/08/21 2126    Blake Divine, MD 04/08/21 2126

## 2021-04-08 NOTE — ED Notes (Signed)
[place] reviewed by this RN ?

## 2021-04-08 NOTE — Telephone Encounter (Signed)
Late entry:  I have spoken to the daughter who states her mother is having an acute episode of unresponsiveness, not able to respond to verbal or physical stimuli, crying/holding head with coughing or sneezing. Recently weaned off generic Keppra for mood changes (reports last dose 04/01/21) and transitioned to lamotrigine. Pending repeat MRI brain with what appears to be two documented attempts for scheduling. Initially, the daughter sent a mychart message then followed it with this urgent call. History of recent hospitalization for status epilepticus. She was instructed to call 911. While on the phone, EMS was arriving on the scene. She will transported to the hospital.

## 2021-04-08 NOTE — ED Triage Notes (Signed)
Pt is coming from home via [facility]. Per [facility] pt is altered and non-verbal today. Last know well per family is last night.   Pt feels pain in all four extremities. Pt can feel sensation in all four extremities. No sign of facial droop. Pt is not speaking at this time.    Pt responds to pain but is not answering of orientation questions. Due to pt not be able to answer questions unable to fully triage.

## 2021-04-08 NOTE — Telephone Encounter (Signed)
Pt's daughter has called to report that pt has been off Keppra for about a week.  Daughter states she doesn't know what to do, pt is unresponsive, can't talk and daughter states every time the pt coughs or sneezes pt hold the left side of her head and cries.  Daughter states she is unsure if she should wait for a call back or take pt to ED.  Phone rep encouraged daughter to take pt to ED if she felt things were to that level of concern.  Daughter states she is unsure and will just wait for a response to either her My chart message or this phone note being sent to RN's for Dr Krista Blue.  Please call.

## 2021-05-12 DEATH — deceased

## 2021-06-14 ENCOUNTER — Telehealth: Payer: Self-pay | Admitting: Neurology

## 2021-06-14 NOTE — Telephone Encounter (Signed)
Pt's daughter, Dondra Spry notified patient has passed away. ?

## 2021-06-16 ENCOUNTER — Ambulatory Visit: Payer: Self-pay | Admitting: Adult Health
